# Patient Record
Sex: Female | Born: 1950 | Race: White | Hispanic: No | Marital: Married | Smoking: Former smoker
Health system: Southern US, Community
[De-identification: ages and names within clinical notes are randomized; demographics above are authoritative.]

## PROBLEM LIST (undated history)

## (undated) DIAGNOSIS — IMO0002 Reserved for concepts with insufficient information to code with codable children: Secondary | ICD-10-CM

## (undated) DIAGNOSIS — R112 Nausea with vomiting, unspecified: Secondary | ICD-10-CM

## (undated) DIAGNOSIS — N189 Chronic kidney disease, unspecified: Secondary | ICD-10-CM

## (undated) DIAGNOSIS — E039 Hypothyroidism, unspecified: Secondary | ICD-10-CM

## (undated) DIAGNOSIS — C801 Malignant (primary) neoplasm, unspecified: Secondary | ICD-10-CM

## (undated) DIAGNOSIS — N3010 Interstitial cystitis (chronic) without hematuria: Secondary | ICD-10-CM

## (undated) DIAGNOSIS — Z9889 Other specified postprocedural states: Secondary | ICD-10-CM

## (undated) DIAGNOSIS — Q2112 Patent foramen ovale: Secondary | ICD-10-CM

## (undated) DIAGNOSIS — F329 Major depressive disorder, single episode, unspecified: Secondary | ICD-10-CM

## (undated) DIAGNOSIS — G4700 Insomnia, unspecified: Secondary | ICD-10-CM

## (undated) DIAGNOSIS — K589 Irritable bowel syndrome without diarrhea: Secondary | ICD-10-CM

## (undated) DIAGNOSIS — E0590 Thyrotoxicosis, unspecified without thyrotoxic crisis or storm: Secondary | ICD-10-CM

## (undated) DIAGNOSIS — I639 Cerebral infarction, unspecified: Secondary | ICD-10-CM

## (undated) DIAGNOSIS — E785 Hyperlipidemia, unspecified: Secondary | ICD-10-CM

## (undated) DIAGNOSIS — F32A Depression, unspecified: Secondary | ICD-10-CM

## (undated) DIAGNOSIS — B977 Papillomavirus as the cause of diseases classified elsewhere: Secondary | ICD-10-CM

## (undated) DIAGNOSIS — M8580 Other specified disorders of bone density and structure, unspecified site: Secondary | ICD-10-CM

## (undated) DIAGNOSIS — A6000 Herpesviral infection of urogenital system, unspecified: Secondary | ICD-10-CM

## (undated) DIAGNOSIS — K219 Gastro-esophageal reflux disease without esophagitis: Secondary | ICD-10-CM

## (undated) DIAGNOSIS — E079 Disorder of thyroid, unspecified: Secondary | ICD-10-CM

## (undated) HISTORY — DX: Gastro-esophageal reflux disease without esophagitis: K21.9

## (undated) HISTORY — DX: Thyrotoxicosis, unspecified without thyrotoxic crisis or storm: E05.90

## (undated) HISTORY — PX: MASTECTOMY, PARTIAL: SHX709

## (undated) HISTORY — DX: Hyperlipidemia, unspecified: E78.5

## (undated) HISTORY — PX: BUNIONECTOMY: SHX129

## (undated) HISTORY — DX: Other specified disorders of bone density and structure, unspecified site: M85.80

## (undated) HISTORY — DX: Interstitial cystitis (chronic) without hematuria: N30.10

## (undated) HISTORY — DX: Reserved for concepts with insufficient information to code with codable children: IMO0002

## (undated) HISTORY — DX: Cerebral infarction, unspecified: I63.9

## (undated) HISTORY — DX: Chronic kidney disease, unspecified: N18.9

## (undated) HISTORY — DX: Papillomavirus as the cause of diseases classified elsewhere: B97.7

## (undated) HISTORY — PX: ENDOMETRIAL ABLATION: SHX621

## (undated) HISTORY — DX: Disorder of thyroid, unspecified: E07.9

## (undated) HISTORY — DX: Irritable bowel syndrome, unspecified: K58.9

## (undated) HISTORY — DX: Herpesviral infection of urogenital system, unspecified: A60.00

## (undated) HISTORY — DX: Major depressive disorder, single episode, unspecified: F32.9

## (undated) HISTORY — DX: Insomnia, unspecified: G47.00

## (undated) HISTORY — DX: Depression, unspecified: F32.A

## (undated) HISTORY — DX: Hypothyroidism, unspecified: E03.9

## (undated) HISTORY — PX: BREAST SURGERY: SHX581

## (undated) HISTORY — DX: Malignant (primary) neoplasm, unspecified: C80.1

---

## 1982-05-17 HISTORY — PX: TUBAL LIGATION: SHX77

## 1983-05-18 HISTORY — PX: GANGLION CYST EXCISION: SHX1691

## 1988-05-17 HISTORY — PX: OTHER SURGICAL HISTORY: SHX169

## 1998-06-17 ENCOUNTER — Other Ambulatory Visit: Admission: RE | Admit: 1998-06-17 | Discharge: 1998-06-17 | Payer: Self-pay | Admitting: Obstetrics and Gynecology

## 2000-06-13 ENCOUNTER — Other Ambulatory Visit: Admission: RE | Admit: 2000-06-13 | Discharge: 2000-06-13 | Payer: Self-pay | Admitting: Obstetrics and Gynecology

## 2001-05-17 DIAGNOSIS — R87619 Unspecified abnormal cytological findings in specimens from cervix uteri: Secondary | ICD-10-CM

## 2001-05-17 DIAGNOSIS — IMO0002 Reserved for concepts with insufficient information to code with codable children: Secondary | ICD-10-CM

## 2001-05-17 HISTORY — DX: Reserved for concepts with insufficient information to code with codable children: IMO0002

## 2001-05-17 HISTORY — DX: Unspecified abnormal cytological findings in specimens from cervix uteri: R87.619

## 2001-05-17 HISTORY — PX: CERVICAL CONE BIOPSY: SUR198

## 2001-07-05 ENCOUNTER — Other Ambulatory Visit: Admission: RE | Admit: 2001-07-05 | Discharge: 2001-07-05 | Payer: Self-pay | Admitting: Obstetrics and Gynecology

## 2001-08-21 ENCOUNTER — Other Ambulatory Visit: Admission: RE | Admit: 2001-08-21 | Discharge: 2001-08-21 | Payer: Self-pay | Admitting: Obstetrics and Gynecology

## 2001-12-25 ENCOUNTER — Other Ambulatory Visit: Admission: RE | Admit: 2001-12-25 | Discharge: 2001-12-25 | Payer: Self-pay | Admitting: Obstetrics and Gynecology

## 2002-01-19 ENCOUNTER — Encounter (INDEPENDENT_AMBULATORY_CARE_PROVIDER_SITE_OTHER): Payer: Self-pay

## 2002-01-19 ENCOUNTER — Ambulatory Visit (HOSPITAL_COMMUNITY): Admission: RE | Admit: 2002-01-19 | Discharge: 2002-01-19 | Payer: Self-pay | Admitting: Obstetrics and Gynecology

## 2002-07-09 ENCOUNTER — Other Ambulatory Visit: Admission: RE | Admit: 2002-07-09 | Discharge: 2002-07-09 | Payer: Self-pay | Admitting: Obstetrics and Gynecology

## 2002-12-28 ENCOUNTER — Other Ambulatory Visit: Admission: RE | Admit: 2002-12-28 | Discharge: 2002-12-28 | Payer: Self-pay | Admitting: Obstetrics and Gynecology

## 2003-05-18 DIAGNOSIS — C801 Malignant (primary) neoplasm, unspecified: Secondary | ICD-10-CM

## 2003-05-18 HISTORY — PX: BREAST LUMPECTOMY: SHX2

## 2003-05-18 HISTORY — DX: Malignant (primary) neoplasm, unspecified: C80.1

## 2003-10-29 ENCOUNTER — Other Ambulatory Visit: Admission: RE | Admit: 2003-10-29 | Discharge: 2003-10-29 | Payer: Self-pay | Admitting: Obstetrics and Gynecology

## 2003-11-26 ENCOUNTER — Encounter: Payer: Self-pay | Admitting: Internal Medicine

## 2004-04-15 ENCOUNTER — Encounter (INDEPENDENT_AMBULATORY_CARE_PROVIDER_SITE_OTHER): Payer: Self-pay | Admitting: *Deleted

## 2004-04-15 ENCOUNTER — Ambulatory Visit (HOSPITAL_COMMUNITY): Admission: RE | Admit: 2004-04-15 | Discharge: 2004-04-16 | Payer: Self-pay | Admitting: General Surgery

## 2004-04-15 ENCOUNTER — Encounter (INDEPENDENT_AMBULATORY_CARE_PROVIDER_SITE_OTHER): Payer: Self-pay | Admitting: General Surgery

## 2004-04-29 ENCOUNTER — Ambulatory Visit (HOSPITAL_BASED_OUTPATIENT_CLINIC_OR_DEPARTMENT_OTHER): Admission: RE | Admit: 2004-04-29 | Discharge: 2004-04-29 | Payer: Self-pay | Admitting: General Surgery

## 2004-04-29 ENCOUNTER — Ambulatory Visit (HOSPITAL_COMMUNITY): Admission: RE | Admit: 2004-04-29 | Discharge: 2004-04-29 | Payer: Self-pay | Admitting: General Surgery

## 2004-04-29 ENCOUNTER — Encounter (INDEPENDENT_AMBULATORY_CARE_PROVIDER_SITE_OTHER): Payer: Self-pay | Admitting: Specialist

## 2004-05-06 ENCOUNTER — Ambulatory Visit: Payer: Self-pay | Admitting: Oncology

## 2004-05-17 HISTORY — PX: BREAST LUMPECTOMY: SHX2

## 2004-05-25 ENCOUNTER — Ambulatory Visit: Admission: RE | Admit: 2004-05-25 | Discharge: 2004-08-12 | Payer: Self-pay | Admitting: Radiation Oncology

## 2004-07-29 ENCOUNTER — Ambulatory Visit: Payer: Self-pay | Admitting: Oncology

## 2004-09-10 ENCOUNTER — Ambulatory Visit: Admission: RE | Admit: 2004-09-10 | Discharge: 2004-09-10 | Payer: Self-pay | Admitting: Radiation Oncology

## 2004-10-21 ENCOUNTER — Ambulatory Visit: Payer: Self-pay | Admitting: Oncology

## 2004-12-09 ENCOUNTER — Other Ambulatory Visit: Admission: RE | Admit: 2004-12-09 | Discharge: 2004-12-09 | Payer: Self-pay | Admitting: Obstetrics and Gynecology

## 2005-01-04 ENCOUNTER — Ambulatory Visit: Payer: Self-pay | Admitting: Oncology

## 2005-04-14 ENCOUNTER — Ambulatory Visit: Payer: Self-pay | Admitting: Oncology

## 2005-07-07 ENCOUNTER — Ambulatory Visit: Payer: Self-pay | Admitting: Oncology

## 2005-10-06 ENCOUNTER — Ambulatory Visit: Payer: Self-pay | Admitting: Oncology

## 2005-11-24 ENCOUNTER — Encounter: Admission: RE | Admit: 2005-11-24 | Discharge: 2005-11-24 | Payer: Self-pay | Admitting: Oncology

## 2005-12-13 ENCOUNTER — Other Ambulatory Visit: Admission: RE | Admit: 2005-12-13 | Discharge: 2005-12-13 | Payer: Self-pay | Admitting: Obstetrics and Gynecology

## 2006-02-08 ENCOUNTER — Encounter (INDEPENDENT_AMBULATORY_CARE_PROVIDER_SITE_OTHER): Payer: Self-pay | Admitting: Specialist

## 2006-02-08 ENCOUNTER — Ambulatory Visit (HOSPITAL_BASED_OUTPATIENT_CLINIC_OR_DEPARTMENT_OTHER): Admission: RE | Admit: 2006-02-08 | Discharge: 2006-02-08 | Payer: Self-pay | Admitting: General Surgery

## 2006-03-30 ENCOUNTER — Ambulatory Visit: Payer: Self-pay | Admitting: Oncology

## 2006-09-14 ENCOUNTER — Ambulatory Visit: Payer: Self-pay | Admitting: Oncology

## 2006-10-31 ENCOUNTER — Encounter: Admission: RE | Admit: 2006-10-31 | Discharge: 2006-10-31 | Payer: Self-pay | Admitting: Oncology

## 2006-12-27 ENCOUNTER — Other Ambulatory Visit: Admission: RE | Admit: 2006-12-27 | Discharge: 2006-12-27 | Payer: Self-pay | Admitting: Addiction Medicine

## 2007-05-18 HISTORY — PX: HYSTEROSCOPY: SHX211

## 2007-05-18 HISTORY — PX: DILATION AND CURETTAGE OF UTERUS: SHX78

## 2007-08-23 ENCOUNTER — Encounter: Admission: RE | Admit: 2007-08-23 | Discharge: 2007-08-23 | Payer: Self-pay | Admitting: Internal Medicine

## 2007-09-07 ENCOUNTER — Ambulatory Visit (HOSPITAL_BASED_OUTPATIENT_CLINIC_OR_DEPARTMENT_OTHER): Admission: RE | Admit: 2007-09-07 | Discharge: 2007-09-07 | Payer: Self-pay | Admitting: Obstetrics and Gynecology

## 2007-09-07 ENCOUNTER — Encounter: Payer: Self-pay | Admitting: Obstetrics and Gynecology

## 2007-09-26 ENCOUNTER — Encounter: Admission: RE | Admit: 2007-09-26 | Discharge: 2007-09-26 | Payer: Self-pay | Admitting: Internal Medicine

## 2007-10-12 ENCOUNTER — Encounter (INDEPENDENT_AMBULATORY_CARE_PROVIDER_SITE_OTHER): Payer: Self-pay | Admitting: Interventional Radiology

## 2007-10-12 ENCOUNTER — Other Ambulatory Visit: Admission: RE | Admit: 2007-10-12 | Discharge: 2007-10-12 | Payer: Self-pay | Admitting: Interventional Radiology

## 2007-10-12 ENCOUNTER — Encounter: Admission: RE | Admit: 2007-10-12 | Discharge: 2007-10-12 | Payer: Self-pay | Admitting: Internal Medicine

## 2007-10-13 ENCOUNTER — Ambulatory Visit (HOSPITAL_BASED_OUTPATIENT_CLINIC_OR_DEPARTMENT_OTHER): Admission: RE | Admit: 2007-10-13 | Discharge: 2007-10-13 | Payer: Self-pay | Admitting: Obstetrics and Gynecology

## 2007-11-06 ENCOUNTER — Encounter: Admission: RE | Admit: 2007-11-06 | Discharge: 2007-11-06 | Payer: Self-pay | Admitting: Endocrinology

## 2007-12-06 ENCOUNTER — Other Ambulatory Visit: Admission: RE | Admit: 2007-12-06 | Discharge: 2007-12-06 | Payer: Self-pay | Admitting: General Surgery

## 2008-01-08 ENCOUNTER — Other Ambulatory Visit: Admission: RE | Admit: 2008-01-08 | Discharge: 2008-01-08 | Payer: Self-pay | Admitting: Obstetrics and Gynecology

## 2008-02-05 ENCOUNTER — Telehealth: Payer: Self-pay | Admitting: Internal Medicine

## 2008-03-12 DIAGNOSIS — K5730 Diverticulosis of large intestine without perforation or abscess without bleeding: Secondary | ICD-10-CM | POA: Insufficient documentation

## 2008-03-12 DIAGNOSIS — D0590 Unspecified type of carcinoma in situ of unspecified breast: Secondary | ICD-10-CM | POA: Insufficient documentation

## 2008-06-30 IMAGING — NM NM THYROID IMAGING W/ UPTAKE SINGLE (24 HR)
1 series · 1 of 1 positions shown · non-contrast
Comparison: None

CLINICAL DATA: Enlarged thyroid gland

NUCLEAR MEDICINE THYROID UPTAKE (24 HOUR) AND SCAN
TECHNIQUE: Following the per oral administration of Q-WOW Sodium
Iodide, the patient returned at 24 hours and uptake measurements
were acquired with the uptake probe centered on the neck. Thyroid
imaging was performed following the intravenous administration of
the Qc-FFm pertechnetate.
Radiopharmaceutical: 16.5 microcurie I 131 sodium iodide for
radioactive iodine uptake calculation.  11.1 mCi technetium 99m
pertechnetate for thyroid scanning.

[st statics, dual detec · 1 of 1 slices shown]
[im 1/1]
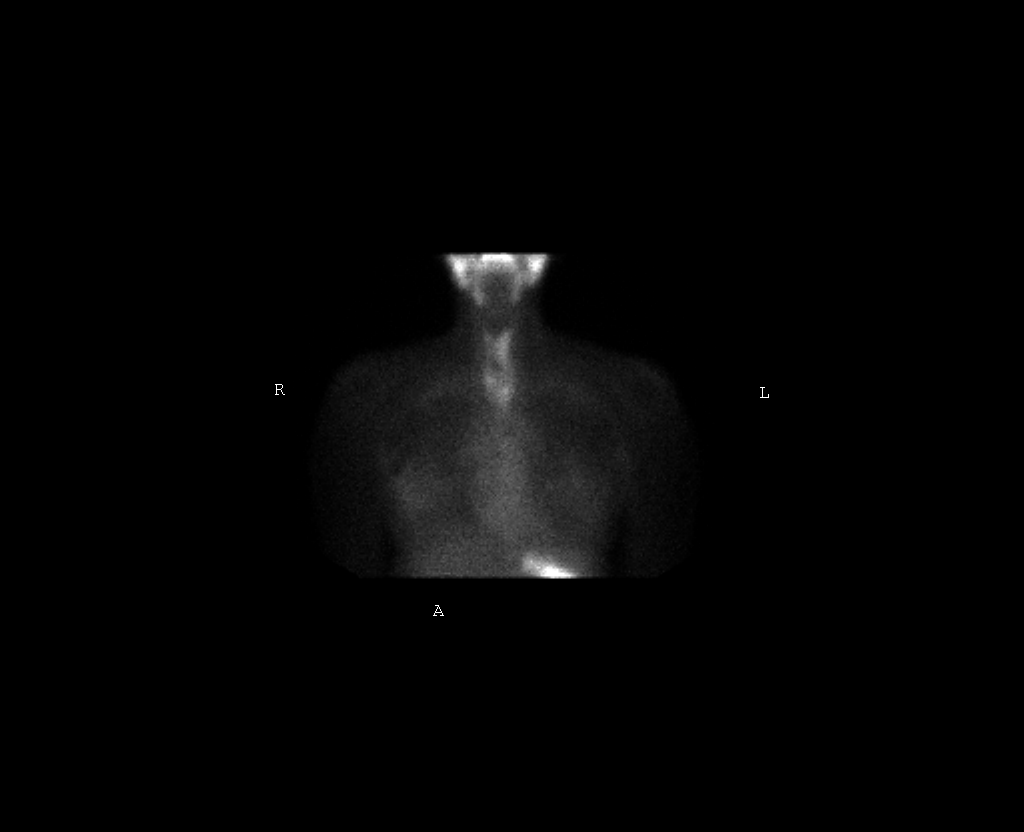

[1 of 1 positions shown; findings below may reference images not displayed]

FINDINGS: Thyroid scanning shows a markedly enlarged thyroid gland
with diffuse heterogeneous pertechnetate uptake.  The uptake
extends into the substernal region where they are is even more
heterogeneous uptake than in the native thyroid location.

Radioactive iodine uptake is calculated at 12%.
IMPRESSION: Markedly enlarged thyroid gland with diffuse heterogeneous
pertechnetate uptake throughout.  The substernal component has a
more heterogeneous uptake than the tissue in the native thyroid
location..

Low normal radioactive iodine uptake.

## 2009-01-14 ENCOUNTER — Encounter: Payer: Self-pay | Admitting: Obstetrics and Gynecology

## 2009-01-14 ENCOUNTER — Other Ambulatory Visit: Admission: RE | Admit: 2009-01-14 | Discharge: 2009-01-14 | Payer: Self-pay | Admitting: Obstetrics and Gynecology

## 2009-01-14 ENCOUNTER — Ambulatory Visit: Payer: Self-pay | Admitting: Obstetrics and Gynecology

## 2009-07-29 ENCOUNTER — Ambulatory Visit: Payer: Self-pay | Admitting: Obstetrics and Gynecology

## 2009-11-06 ENCOUNTER — Telehealth: Payer: Self-pay | Admitting: Internal Medicine

## 2009-11-28 ENCOUNTER — Encounter: Admission: RE | Admit: 2009-11-28 | Discharge: 2009-11-28 | Payer: Self-pay | Admitting: Radiology

## 2010-01-15 ENCOUNTER — Other Ambulatory Visit: Admission: RE | Admit: 2010-01-15 | Discharge: 2010-01-15 | Payer: Self-pay | Admitting: Obstetrics and Gynecology

## 2010-01-15 ENCOUNTER — Ambulatory Visit: Payer: Self-pay | Admitting: Obstetrics and Gynecology

## 2010-03-25 ENCOUNTER — Ambulatory Visit: Payer: Self-pay | Admitting: Obstetrics and Gynecology

## 2010-05-17 HISTORY — PX: MOUTH SURGERY: SHX715

## 2010-06-07 ENCOUNTER — Encounter: Payer: Self-pay | Admitting: Radiology

## 2010-06-16 NOTE — Progress Notes (Signed)
Summary: TRIAGE  Phone Note Call from Patient   Caller: Patient Call For: GESSNER Reason for Call: Talk to Nurse Details for Reason: TRIAGE Summary of Call: BLOOD IN STOOL pt is NOT due for another colon until summer of 2015 Initial call taken by: Guadlupe Spanish [facility],  February 05, 2008 3:58 PM  Follow-up for Phone Call        Left message for patient to call back Darcey Nora RN  February 06, 2008 7:59 AM  left message for pt to call back and schedule office visit with Dr Elzie Rings Follow-up by: Darcey Nora RN,  February 06, 2008 1:02 PM

## 2010-06-16 NOTE — Procedures (Signed)
Summary: COLON   Colonoscopy  Procedure date:  11/26/2003  Findings:      Location:  [facility].    Procedures Next Due Date:    Colonoscopy: 11/2013   Patient Name: Jaclyn Lopez MRN:  Procedure Procedures: Colonoscopy CPT: 69629.  Personnel: Endoscopist: Iva Boop, MD, [facility].  Referred By: Rande Brunt. Eda Paschal, M.D.  Exam Location: Exam performed in Outpatient Clinic. Outpatient  Patient Consent: Procedure, Alternatives, Risks and Benefits discussed, consent obtained, from patient. Consent was obtained by the RN.  Indications  Average Risk Screening Routine.  History  Current Medications: Patient is not currently taking Coumadin.  Pre-Exam Physical: Performed Nov 26, 2003. Cardio-pulmonary exam, Rectal exam, HEENT exam , Abdominal exam, Mental status exam WNL.  Exam Exam: Extent of exam reached: Cecum, extent intended: Cecum.  The cecum was identified by appendiceal orifice and IC valve. Patient position: on left side. Colon retroflexion performed. Images taken. ASA Classification: I. Tolerance: good.  Monitoring: Pulse and BP monitoring, Oximetry used. Supplemental O2 given.  Colon Prep Used MiraLax for colon prep. Prep results: excellent.  Sedation Meds: Patient assessed and found to be appropriate for moderate (conscious) sedation. Fentanyl 100 mcg. given IV. Versed 10 mg. given IV.  Findings - NORMAL EXAM: Cecum to Descending Colon.  DIVERTICULOSIS: Sigmoid Colon. ICD9: Diverticulosis, Colon: 562.10. Comments: Mild.  NORMAL EXAM: Rectum.   Assessment Abnormal examination, see findings above.  Diagnoses: 562.10: Diverticulosis, Colon.   Comments: MILD SIGMOID DIVERTICULOSIS Events  Unplanned Interventions: No intervention was required.  Plans Patient Education: Patient given standard instructions for: Diverticulosis. Yearly hemoccult testing recommended. START HEMOCCULTS IN 5 YEARS.  Disposition: After procedure  patient sent to recovery. After recovery patient sent home.  Scheduling/Referral: Colonoscopy, to Iva Boop, MD, [facility], 10 YEARS,    This report was created from the original endoscopy report, which was reviewed and signed by the above listed endoscopist.

## 2010-06-16 NOTE — Progress Notes (Signed)
Summary: Recall colon  Phone Note Call from Patient Call back at Home Phone [phone]   Call For: DR [facility] Summary of Call: Wonders if her REcall Colon should be sooner due to having had breast cancer in the past.  Aware Dr is off rest of the week- ok to call back after her chart is reviewed sometime next week. Initial call taken by: Leanor Kail [facility],  November 06, 2009 2:32 PM  Follow-up for Phone Call        Paper chart ordered.     Follow-up by: Darcey Nora RN, CGRN,  November 06, 2009 3:22 PM  Additional Follow-up for Phone Call Additional follow up Details #1::        Paper chart is in your office please advise Additional Follow-up by: Darcey Nora RN, CGRN,  November 07, 2009 1:48 PM    Additional Follow-up for Phone Call Additional follow up Details #2::    If she has BRCA1 genetic abnormality, then yes. Otherwise it is unclear and not typical to do at 5 years, usually do as we do for routine risk.  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  She has no symptoms.  Recall is in for 2015 Additional Follow-up by: Darcey Nora RN, CGRN,  November 13, 2009 3:39 PM

## 2010-09-29 NOTE — Op Note (Signed)
[name], Jaclyn Lopez            ACCOUNT NO.:  [id number]   MEDICAL RECORD NO.:  [id number]          PATIENT TYPE:  AMB   LOCATION:  [facility]                         FACILITY:  [facility]   PHYSICIAN:  Daniel L. Gottsegen, M.D.DATE OF BIRTH:  09-18-1950   DATE OF PROCEDURE:  10/13/2007  DATE OF DISCHARGE:                               OPERATIVE REPORT   PREOPERATIVE DIAGNOSIS:  Menometrorrhagia.   POSTOPERATIVE DIAGNOSIS:  Menometrorrhagia.   NAME OF OPERATION:  NovaSure endometrial ablation.   INDICATIONS:  The patient is a 60 year old female who has persisted in  having menometrorrhagia.  She has had a previous hysteroscopy this year,  with excision of an endometrial polyp, but has continued to have the  above pattern.  She is a breast cancer survivor and cannot be cycled  with oral progestins because of the above.  Because of this persistence,  she enters the hospital now for endometrial ablation by NovaSure  technique.   FINDINGS:  External is normal.  BUS is normal.  Vaginal is normal.  Cervix is clean.  Uterus is normal size and shape.  Adnexa failed to  reveal masses.  At the time of hysteroscopy, the patient had a  completely normal endometrial cavity, with evidence of some endometrial  buildup.  Tubal ostia, top of the fundus, anterior and posterior walls  of the fundus, lower uterine segment, and endocervical canal were all  free of disease.   PROCEDURE:  After adequate general anesthesia, the patient was placed in  the dorsal lithotomy position, prepped and draped in the usual sterile  manner.  A diagnostic hysteroscopy was done with Ringer's lactate and  failed to reveal any intrauterine defects.  The uterus sounded to 7 cm.  Her endocervix had a depth of approximately 3.5 cm, leaving a cavity  length of about 3.5 cm.  As a result of this, we used the 4 cm cavity  length on the NovaSure, which is the lowest setting.  The NovaSure was  then introduced after the measurements  had been taken.  It was opened  inside the endometrial cavity without difficulty.  The patient had been  dilated to a #8 Hagar dilator to do so.  We three different times did  the anterior, posterior, side-to-side maneuvers to try to open the  device as far as possible so that we could get good contact with all  parts of the endometrial cavity.  The final cavity width was 2.9 cm.  A  cavity assessment test was then done, which the patient passed, without  any evidence of leakage, and endometrial ablation was then done with the  NovaSure.  It took 105 seconds to completely ablate the cavity.  Power  was 64.  At the termination of the procedure, the NovaSure was removed.  She was re-hysteroscoped.  An excellent endometrial ablation had been  obtained.  It was noted that the endocervical canal had not been  ablated, so we felt that the risk of hematometra would be very low, as  the ablation stopped right at the lower uterine segment without entering  the endocervical canal.  Blood loss was  minimal.  The patient tolerated  the procedure well and left the operating room in satisfactory  condition.      Daniel L. Eda Paschal, M.D.  Electronically Signed    DLG/MEDQ  D:  10/13/2007  T:  10/13/2007  Job:  [id number]

## 2010-09-29 NOTE — Op Note (Signed)
Jaclyn Lopez, Jaclyn Lopez            ACCOUNT NO.:  [id number]   MEDICAL RECORD NO.:  [id number]          PATIENT TYPE:  AMB   LOCATION:  [facility]                         FACILITY:  [facility]   PHYSICIAN:  Daniel L. Gottsegen, M.D.DATE OF BIRTH:  09/11/1950   DATE OF PROCEDURE:  09/07/2007  DATE OF DISCHARGE:                               OPERATIVE REPORT   PREOPERATIVE DIAGNOSIS:  Postmenopausal bleeding.   POSTOPERATIVE DIAGNOSIS:  1. Postmenopausal bleeding.  2. Endometrial polyp.   OPERATIONS:  Hysteroscopy D&C and excision of endometrial polyp.   SURGEON:  Dr. Eda Paschal.   ANESTHESIA:  General.   INDICATIONS:  The patient is a 60 year old, gravida 2, para 2 who had  presented to the office with what was initially thought to be  postmenopausal bleeding. Ultrasound did not reveal a source of the  bleeding.  She had had a previously elevated FSH and FSH was rechecked  in the office and actually was back in the premenopausal range however,  she continued to bleed nonstop for a solid month.  She is status post  treatment for breast cancer and it was not felt that hormonal therapy  was appropriate.  She therefore enters the hospital for a hysteroscopy  D&C for the above.   FINDINGS:  External is normal, BUS is normal. Vaginal is normal.  Cervix  is clean.  Uterus is normal size and shape.  Adnexa failed to reveal  masses. At the time of hysteroscopy, the patient did have an endometrial  polyp coming off the top of the fundus. It was less than a centimeter.  Endometrial curettings were scant to moderate, tubal ostia, top of the  fundus, anterior and posterior walls of the fundus, lower uterine  segment were all free of disease as was the cervical canal.   PROCEDURE:  After adequate general endotracheal anesthesia, the patient  was placed in the dorsal lithotomy supine position, prepped and draped  in the usual sterile manner.  A single-tooth tenaculum was placed in the  anterior lip of  the cervix, the cervix was dilated. Once we got to a 29  dilator, it was very difficult to dilate her. She was postmenopausal by  history as well as having had one C-section. It was felt at this point  that it might be dangerous to continue to push hard enough to dilate  her, so instead a diagnostic hysteroscope was introduced, 3% sorbitol  was used to expand the intrauterine cavity. A  camera was used for  magnification.  The above findings were noted.  Using a curette and  Randall polyp forceps, tissue was removed as well as the small polyp.  She was rehysteroscoped and although nothing obvious was seen the  surgeon was still concerned that he may not have had as good a view was  he would like. At this point, it was felt that maybe she could be  dilated further without any trouble. It did require a fair bit of force  but she was dilated further. The resectoscope then could be introduced.  A much better view  was obtained and there was no pathology left. The  procedure was  terminated.  Blood loss was minimal, fluid deficit was 30 mL. The tissue  was sent to pathology for tissue diagnosis.  The patient left the  operating room in satisfactory condition.      Daniel L. Eda Paschal, M.D.  Electronically Signed     DLG/MEDQ  D:  09/07/2007  T:  09/07/2007  Job:  [id number]

## 2010-10-02 NOTE — Op Note (Signed)
Lopez, Jaclyn            ACCOUNT NO.:  [id number]   MEDICAL RECORD NO.:  [id number]          PATIENT TYPE:  AMB   LOCATION:  [facility]                          FACILITY:  [facility]   PHYSICIAN:  Adolph Pollack, M.D.DATE OF BIRTH:  1951/03/29   DATE OF PROCEDURE:  04/29/2004  DATE OF DISCHARGE:                                 OPERATIVE REPORT   PREOPERATIVE DIAGNOSIS:  High grade ductal carcinoma in situ, right breast,  status post right breast lumpectomy.   POSTOPERATIVE DIAGNOSIS:  High grade ductal carcinoma in situ, right breast,  status post right breast lumpectomy.   OPERATION PERFORMED:  Re-excision of right breast lumpectomy site.   SURGEON:  Adolph Pollack, M.D.   ANESTHESIA:  General.   INDICATIONS FOR PROCEDURE:  Ms. [name] is a 60 year old female status post  right partial mastectomy, April 15, 2004.  She had this for an abnormal  mammogram and it was done after needle localization.  Pathology came back  with high grade DCIS with a 0.1 cm margin.  After presentation at the breast  cancer conference, it was felt that a wider margin would be beneficial to  her and now she presents for that.   DESCRIPTION OF PROCEDURE:  She was seen in the holding area and brought to  the operating room and placed supine on the operating table and a general  anesthetic was administered.  The Steri-Strips on the right upper outer  quadrant were removed and the right breast was sterilely prepped and draped.  I incised the incision using elliptical type incision and tried to stay  superficial; however, quickly entered the previous cavity and drained a  seroma.  I subsequently just removed the skin and called this an anterior  margin.  I did not think I could do an excision because some of the skin  flaps were so thin and so I just went from site to site.  I began with the  medial margin and excised that sharply and labeled it separately.  I did the  same with the superior,  lateral, inferior and posterior margins.  The  posterior margin was on the chest wall.  This was multiple specimens, all of  which were labeled appropriately and sent to pathology.   I then controlled bleeding with electrocautery and infiltrated some local  anesthetic (plain Marcaine into the superficial and deep tissues of the  wound.  Once hemostasis was adequate, I irrigated the wound.  I then closed  the skin with a running 4-0 Monocryl subcuticular stitch followed by  Dermabond.  Steri-Strips and a sterile dressing were applied.  The patient  tolerated the procedure well without any apparent complications and was  taken to the recovery room in satisfactory condition.  Because she had such  a severe reaction of nausea and vomiting last time, I  decided to go ahead and admit her for an overnight stay.  If later this  evening, she does not have such a significant problem with nausea and  vomiting, she can potentially go home.  Plan will be to follow up with her  some time next  Tuesday in the late afternoon.      Tish Men  D:  04/29/2004  T:  04/29/2004  Job:  [id number]   cc:   Reuel Boom L. Eda Paschal, M.D.  [address]  [place]  [place] [address]  Fax: [phone]   Dr. Jeralyn Ruths   Dr. Fidela Salisbury

## 2010-10-02 NOTE — Op Note (Signed)
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   PROCEDURE:  Left breast biopsy after needle localization.   SURGEON:  Adolph Pollack, M.D.   ANESTHESIA:  General plus 0.5% plain Marcaine local.   INDICATIONS:  Ms. [name] is a 60 year old female with a history of DCIS of  the right breast.  She had an MRI done of the breast which demonstrated  abnormal area in left breast at 9 o'clock position and ultrasound guided  biopsy demonstrated some extensive atypical ductal hyperplasia.  It was felt  that it would be best to remove this area.  A successful wire localization  was done by Dr. Jeralyn Ruths and now she presents for the above  procedure.   TECHNIQUE:  She is seen the holding area and left breast marked with my  initials.  She was then brought to the operating room, placed supine on the  operating table and general anesthetic was administered.  The bandage was  removed.  The wire was cut.  The left breast and wire  were sterilely  prepped and draped.  There is an X made by Dr. Yolanda Bonine on the skin over the  area of concern and a curvilinear incision was made in the medial aspect of  the breast through skin and subcutaneous tissue.  I then placed the wire  into the wound.  I then excised the core of tissue around the midportion of  the wire where the area of concern was and sent this for specimen mammogram.  The specimen mammogram confirmed that the area of concern was in the   specimen.   Following this I injected the wound with 0.5% plain Marcaine.  Hemostasis  was controlled with electrocautery.  Once hemostasis was adequate.  The  wound was closed in two layers.  The subcutaneous tissue was approximated  with interrupted 3-0 Vicryl sutures and skin was closed with a running 4-0  Monocryl subcuticular stitch.  Steri-Strips and sterile dressing were  applied.   She tolerated the procedure without apparent complications and was taken to  recovery in satisfactory condition.  She will be given postop instructions  and medication for nausea and pain and follow-up in the office in about 1-2  weeks.      Adolph Pollack, M.D.  Electronically Signed     TJR/MEDQ  D:  02/08/2006  T:  02/10/2006  Job:  [id number]   cc:   Dellia Beckwith, M.D.  Daniel L. Eda Paschal, M.D.  Maryln Gottron, M.D.  Etter Sjogren, M.D.

## 2010-10-02 NOTE — Op Note (Signed)
NAME:  Jaclyn Lopez, Jaclyn Lopez            ACCOUNT NO.:  [id number]   MEDICAL RECORD NO.:  [id number]          PATIENT TYPE:  OIB   LOCATION:  NA                           FACILITY:  [facility]   PHYSICIAN:  Adolph Pollack, M.D.DATE OF BIRTH:  03-28-1951   DATE OF PROCEDURE:  04/15/2004  DATE OF DISCHARGE:                                 OPERATIVE REPORT   PREOPERATIVE DIAGNOSIS:  Abnormal microcalcifications, right breast.   POSTOPERATIVE DIAGNOSIS:  Abnormal microcalcifications, right breast.   OPERATION PERFORMED:  Right partial mastectomy after needle localization.   SURGEON:  Adolph Pollack, M.D.   ANESTHESIA:  General.   INDICATIONS FOR PROCEDURE:  Ms.  [name] is a 60 year old female who has been  having some interval mammograms and suspicious microcalcifications of the  upper outer quadrant of the right breast were noted.  A stereotactic biopsy  was not able to be done.  She now presents for right partial mastectomy  after bracketed needle localization.   DESCRIPTION OF PROCEDURE:  After having successful bracketed needle  localization by Dr. Yolanda Bonine, she was seen in the holding area, brought to  the operating room, placed supine on the operating table and a general  anesthetic was administered.  The wires were exposed in the upper outer  quadrant of the right breast and part of them were cut.  The area was then  sterilely prepped and draped.  A curvilinear incision was made in the upper  outer quadrant of the right breast.  Subcutaneous tissue was dissected  sharply and the wires were both delivered into the wound.  Next, I performed  a partial mastectomy at base of the upper outer quadrant of the right breast  sharply and with electrocautery.  I recovered the wires.  I sent it for  specimen mammography and the area of concern was contained in the specimen.   The wound bed was evaluated and bleeding was controlled with interrupted  Vicryl sutures and cautery.  The wound was  irrigated.  The dissection went  all the way down to her chest wall.  Once the hemostasis was adequate I then  closed the wound in two layers closing the subcutaneous tissue loosely with  3-0 Vicryl sutures and closing the skin  with 4-0 Monocryl subcuticular stitch.  Steri-Strips and sterile dressing  were applied.  The patient tolerated the procedure well without any apparent  complications.  She subsequently was taken to recovery room in satisfactory  condition.      Tish Men  D:  04/15/2004  T:  04/15/2004  Job:  [id number]   cc:   Dr. Jeralyn Ruths   Dr. Roney Marion

## 2010-10-02 NOTE — Op Note (Signed)
NAME:  Jaclyn Lopez                          ACCOUNT NO.:  [id number]   MEDICAL RECORD NO.:  [id number]                   PATIENT TYPE:  AMB   LOCATION:  [facility]                                  FACILITY:  [facility]   PHYSICIAN:  Daniel L. Eda Paschal, M.D.           DATE OF BIRTH:  [birth year]   DATE OF PROCEDURE:  01/19/2002  DATE OF DISCHARGE:                                 OPERATIVE REPORT   PREOPERATIVE DIAGNOSES:  Abnormal Pap smear with concern about significant  endocervical atypia.   POSTOPERATIVE DIAGNOSES:  Abnormal Pap smear with concern about significant  endocervical atypia.   NAME OF OPERATION:  Sharp knife conization.   SURGEON:  Daniel L. Eda Paschal, M.D.   ANESTHESIA:  General.   INDICATIONS:  The patient is a 61 year old gravida 2, para 2, AB0 who had  been seeing me for annual examinations on a long-term basis and always had  had normal Pap smears.  Starting this year, however, she had two Pap smears  that showed ASCUS.  As a result of this she underwent colposcopy.  Ectocervix appeared normal but random biopsies were obtained.  ECC was also  obtained and both of the biopsy areas were normal.  However, her next Pap  smear continued to show atypia.  It was reviewed by Dr. Tamala Fothergill who had  grave concern that she might have a significant endocervical atypia in spite  of the above.  He did not actually see it but he felt that there was just  enough suspicion in the endocervical cells that he felt conization should be  done so that we would get as deep as possible to rule out any endocervical  adenocarcinoma.  She now enters the hospital for the above.   FINDINGS:  External and vaginal is within normal limits.  Cervix is  completely clean.  Uterus is mid position, normal size and shape.  Ovaries  are palpably normal.   PROCEDURE:  The patient was taken to the operating room, given general  endotracheal anesthesia and placed in the dorsal lithotomy position.  She  was prepped and draped in the usual sterile manner.  Vicryl 0 lateral  sutures were placed at 3 and 9 o'clock.  A solution of 0.5% Xylocaine with  1:200,000 epinephrine was injected around the cervix and then a sharp knife  conization was done to as deep as the knife could penetrate.  The specimen  was removed.  It was labeled and tagged for orientation and then an attempt  was made to go even deeper.  An Lavena Bullion was placed in the endocervix and some  additional higher endocervical tissue was also sharply cut free.  The areas  that were oozing a little bit were bovied.  Surgicel was placed in the canal  and then a 360 degree McDonald's type suture was placed with 0 Vicryl to  eliminate any other bleeding.  It was tied in  place and at this point the  patient was doing no bleeding.  Estimated blood loss for the entire  procedure was less than 10 cc with none replaced.  The patient tolerated  procedure well and left the operating room in satisfactory condition.                                               Daniel L. Eda Paschal, M.D.    Tonette Bihari  D:  01/19/2002  T:  01/19/2002  Job:  [id number]

## 2010-10-13 ENCOUNTER — Encounter (INDEPENDENT_AMBULATORY_CARE_PROVIDER_SITE_OTHER): Payer: Self-pay | Admitting: General Surgery

## 2010-12-24 ENCOUNTER — Encounter: Payer: Self-pay | Admitting: General Surgery

## 2011-02-24 ENCOUNTER — Encounter: Payer: Self-pay | Admitting: Gynecology

## 2011-02-24 DIAGNOSIS — N840 Polyp of corpus uteri: Secondary | ICD-10-CM | POA: Insufficient documentation

## 2011-02-24 DIAGNOSIS — M8580 Other specified disorders of bone density and structure, unspecified site: Secondary | ICD-10-CM | POA: Insufficient documentation

## 2011-02-24 DIAGNOSIS — N3010 Interstitial cystitis (chronic) without hematuria: Secondary | ICD-10-CM | POA: Insufficient documentation

## 2011-02-24 DIAGNOSIS — R87619 Unspecified abnormal cytological findings in specimens from cervix uteri: Secondary | ICD-10-CM | POA: Insufficient documentation

## 2011-02-24 DIAGNOSIS — E039 Hypothyroidism, unspecified: Secondary | ICD-10-CM | POA: Insufficient documentation

## 2011-02-24 DIAGNOSIS — IMO0002 Reserved for concepts with insufficient information to code with codable children: Secondary | ICD-10-CM | POA: Insufficient documentation

## 2011-02-24 DIAGNOSIS — E079 Disorder of thyroid, unspecified: Secondary | ICD-10-CM | POA: Insufficient documentation

## 2011-02-24 DIAGNOSIS — A6000 Herpesviral infection of urogenital system, unspecified: Secondary | ICD-10-CM | POA: Insufficient documentation

## 2011-03-02 ENCOUNTER — Encounter: Payer: Self-pay | Admitting: Obstetrics and Gynecology

## 2011-03-02 ENCOUNTER — Other Ambulatory Visit (HOSPITAL_COMMUNITY)
Admission: RE | Admit: 2011-03-02 | Discharge: 2011-03-02 | Disposition: A | Discharge status: Home / Self Care | Payer: BC Managed Care – PPO | Source: Ambulatory Visit | Attending: Obstetrics and Gynecology | Admitting: Obstetrics and Gynecology

## 2011-03-02 ENCOUNTER — Ambulatory Visit (INDEPENDENT_AMBULATORY_CARE_PROVIDER_SITE_OTHER): Payer: BC Managed Care – PPO | Admitting: Obstetrics and Gynecology

## 2011-03-02 VITALS — BP 110/60 | Ht 66.0 in | Wt 135.0 lb

## 2011-03-02 DIAGNOSIS — R635 Abnormal weight gain: Secondary | ICD-10-CM

## 2011-03-02 DIAGNOSIS — Z01419 Encounter for gynecological examination (general) (routine) without abnormal findings: Secondary | ICD-10-CM | POA: Insufficient documentation

## 2011-03-02 DIAGNOSIS — B009 Herpesviral infection, unspecified: Secondary | ICD-10-CM

## 2011-03-02 MED ORDER — ACYCLOVIR 200 MG PO CAPS: ORAL_CAPSULE | ORAL | Status: DC

## 2011-03-02 NOTE — Progress Notes (Signed)
Patient came to see me today for her annual GYN exam. From the standpoint of her breast cancer and gynecologically  she is stable without problems. She remains on Zovirax 3 times a week to prevent herpes recurrences. She's having no vaginal bleeding. She is having no pelvic pain. She is now on two any depressants with good results but is a lot of weight gain on them. She is up-to-date on mammograms and bone densities. She is osteopenia without an elevated FRAX risk. She takes calcium and vitamin D. She's had no fractures.  ROS: All systems reviewed. Positives include what is noted above as well as hypothyroidism, diverticulosis, and interstitial cystitis.  Physical examination: Kennon Portela present. HEENT within normal limits. Neck: Thyroid not large. No masses. Supraclavicular nodes: not enlarged. Breasts: Examined in both sitting midline position. No skin changes and no masses. Abdomen: Soft no guarding rebound or masses or hernia. Pelvic: External: Within normal limits. BUS: Within normal limits. Vaginal:within normal limits. Poor estrogen effect. No evidence of cystocele rectocele or enterocele. Cervix: clean. Uterus: Normal size and shape. Adnexa: No masses. Rectovaginal exam: Confirmatory and negative. Extremities: Within normal limits.  Assessment: #1. 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 Osteopenia #4. Weight gain  Plan: Continue Zovirax 3 times a week. Continue yearly mammograms. Bone density in November 2013. Discussed with the patient changing from current drugs to Pristiq to help her lose weight.

## 2011-03-05 ENCOUNTER — Encounter: Payer: Self-pay | Admitting: Obstetrics and Gynecology

## 2011-05-06 ENCOUNTER — Other Ambulatory Visit: Payer: Self-pay | Admitting: Internal Medicine

## 2011-05-06 DIAGNOSIS — E041 Nontoxic single thyroid nodule: Secondary | ICD-10-CM

## 2011-05-13 ENCOUNTER — Other Ambulatory Visit: Payer: BC Managed Care – PPO

## 2011-05-17 ENCOUNTER — Ambulatory Visit
Admission: RE | Admit: 2011-05-17 | Discharge: 2011-05-17 | Disposition: A | Discharge status: Home / Self Care | Payer: BC Managed Care – PPO | Source: Ambulatory Visit | Attending: Internal Medicine | Admitting: Internal Medicine

## 2011-05-17 DIAGNOSIS — E041 Nontoxic single thyroid nodule: Secondary | ICD-10-CM

## 2011-06-10 ENCOUNTER — Encounter (INDEPENDENT_AMBULATORY_CARE_PROVIDER_SITE_OTHER): Payer: Self-pay | Admitting: General Surgery

## 2011-11-19 ENCOUNTER — Other Ambulatory Visit: Payer: Self-pay | Admitting: *Deleted

## 2011-11-19 DIAGNOSIS — Z853 Personal history of malignant neoplasm of breast: Secondary | ICD-10-CM

## 2011-12-02 ENCOUNTER — Other Ambulatory Visit: Payer: Self-pay | Admitting: Obstetrics and Gynecology

## 2011-12-02 DIAGNOSIS — Z853 Personal history of malignant neoplasm of breast: Secondary | ICD-10-CM

## 2012-04-11 ENCOUNTER — Other Ambulatory Visit (HOSPITAL_COMMUNITY)
Admission: RE | Admit: 2012-04-11 | Discharge: 2012-04-11 | Disposition: A | Discharge status: Home / Self Care | Payer: BC Managed Care – PPO | Source: Ambulatory Visit | Attending: Obstetrics and Gynecology | Admitting: Obstetrics and Gynecology

## 2012-04-11 ENCOUNTER — Encounter: Payer: Self-pay | Admitting: Obstetrics and Gynecology

## 2012-04-11 ENCOUNTER — Ambulatory Visit (INDEPENDENT_AMBULATORY_CARE_PROVIDER_SITE_OTHER): Payer: BC Managed Care – PPO | Admitting: Obstetrics and Gynecology

## 2012-04-11 VITALS — BP 120/64 | Ht 66.0 in | Wt 140.0 lb

## 2012-04-11 DIAGNOSIS — R87810 Cervical high risk human papillomavirus (HPV) DNA test positive: Secondary | ICD-10-CM | POA: Insufficient documentation

## 2012-04-11 DIAGNOSIS — Z01419 Encounter for gynecological examination (general) (routine) without abnormal findings: Secondary | ICD-10-CM

## 2012-04-11 DIAGNOSIS — B009 Herpesviral infection, unspecified: Secondary | ICD-10-CM

## 2012-04-11 DIAGNOSIS — Z1151 Encounter for screening for human papillomavirus (HPV): Secondary | ICD-10-CM | POA: Insufficient documentation

## 2012-04-11 MED ORDER — ACYCLOVIR 200 MG PO CAPS: ORAL_CAPSULE | ORAL | Status: DC

## 2012-04-11 NOTE — Progress Notes (Addendum)
Patient came to see me today for her annual GYN exam. In 2005 she had ductal carcinoma in situ of her right breast. She was treated with lumpectomy and radiation. She has done well without recurrence. She is up-to-date on her mammograms. In 2003 she had recurrent ascus on Pap smear. She also had atypical glandular cells. Colposcopy with biopsies were all normal. Dr. Guilford Shi  was still very concerned that she might have an endocervical lesion and in spite of a negative ECC we did a conization. Pathology was benign. She has had normal Pap smears since then. Her last Pap smear was 2012. She also had recurrent postmenopausal bleeding. At one point she had an endometrial polyp but after excision she continued to have abnormal bleeding. She therefore underwent an Endometrial ablation in 2009  and Has had no bleeding since then. She has osteopenia on bone density without an elevated fracture risk. Her last bone density was 2011 and she will schedule followup. She has hyperthyroidism and continues to see the endocrinologist. She has a history of herpes progenitalis and has been on Zovirax. She has been able to taper to 3 times a week but gets recurrences if she tapers it  More.  Physical examination:Jaclyn Lopez present. HEENT within normal limits. Neck: Thyroid not large. No masses. Supraclavicular nodes: not enlarged. Breasts: Examined in both sitting and lying  position. No skin changes and no masses. Abdomen: Soft no guarding rebound or masses or hernia. Pelvic: External: Within normal limits. BUS: Within normal limits. Vaginal:within normal limits. Good estrogen effect. No evidence of cystocele rectocele or enterocele. Cervix: clean. Uterus: Normal size and shape. Adnexa: No masses. Rectovaginal exam: Confirmatory and negative. Extremities: Within normal limits.  Assessment: #1. Ductal carcinoma in situ of right breast #2. Osteopenia #3. HSV-2 #4. Abnormal Pap smears without CIN ever identified-normal since  2003 #5. Postmenopausal bleeding-resolved since 2009 with endometrial ablation.  Plan: Continue yearly mammograms. Continue bone density she will schedule. Continue Zovirax 200 mg 3 times a week. Pap with high-risk HPV typing done.The new Pap smear guidelines were discussed with the patient.   Addendum: Pap smear showed no atypia but high-risk HPV detected. Discussed with patient. She will get a followup Pap with high-risk HPV typing in one year and then consider colposcopy if still positive.

## 2012-04-11 NOTE — Patient Instructions (Signed)
Schedule bone density.    

## 2012-04-12 LAB — URINALYSIS W MICROSCOPIC + REFLEX CULTURE
Bacteria, UA: NONE SEEN
Bilirubin Urine: NEGATIVE
Casts: NONE SEEN
Crystals: NONE SEEN
Glucose, UA: NEGATIVE mg/dL
Hgb urine dipstick: NEGATIVE
Ketones, ur: NEGATIVE mg/dL
Leukocytes, UA: NEGATIVE
Nitrite: NEGATIVE
Protein, ur: NEGATIVE mg/dL
Specific Gravity, Urine: 1.01 (ref 1.005–1.030)
Squamous Epithelial / HPF: NONE SEEN
Urobilinogen, UA: 0.2 mg/dL (ref 0.0–1.0)
pH: 7 (ref 5.0–8.0)

## 2012-04-19 ENCOUNTER — Encounter: Payer: Self-pay | Admitting: Obstetrics and Gynecology

## 2012-06-14 ENCOUNTER — Ambulatory Visit (INDEPENDENT_AMBULATORY_CARE_PROVIDER_SITE_OTHER): Payer: BC Managed Care – PPO

## 2012-06-14 ENCOUNTER — Other Ambulatory Visit: Payer: Self-pay | Admitting: Women's Health

## 2012-06-14 ENCOUNTER — Other Ambulatory Visit: Payer: Self-pay | Admitting: Gynecology

## 2012-06-14 DIAGNOSIS — M8580 Other specified disorders of bone density and structure, unspecified site: Secondary | ICD-10-CM

## 2012-06-14 DIAGNOSIS — M899 Disorder of bone, unspecified: Secondary | ICD-10-CM

## 2012-06-15 ENCOUNTER — Encounter: Payer: Self-pay | Admitting: Gynecology

## 2013-07-31 ENCOUNTER — Other Ambulatory Visit: Payer: Self-pay | Admitting: Obstetrics and Gynecology

## 2013-08-28 ENCOUNTER — Ambulatory Visit (INDEPENDENT_AMBULATORY_CARE_PROVIDER_SITE_OTHER): Payer: BC Managed Care – PPO | Admitting: Gynecology

## 2013-08-28 ENCOUNTER — Other Ambulatory Visit (HOSPITAL_COMMUNITY)
Admission: RE | Admit: 2013-08-28 | Discharge: 2013-08-28 | Disposition: A | Discharge status: Home / Self Care | Payer: BC Managed Care – PPO | Source: Ambulatory Visit | Attending: Gynecology | Admitting: Gynecology

## 2013-08-28 ENCOUNTER — Encounter: Payer: Self-pay | Admitting: Gynecology

## 2013-08-28 VITALS — BP 120/76 | Ht 66.0 in | Wt 143.0 lb

## 2013-08-28 DIAGNOSIS — B009 Herpesviral infection, unspecified: Secondary | ICD-10-CM

## 2013-08-28 DIAGNOSIS — M8580 Other specified disorders of bone density and structure, unspecified site: Secondary | ICD-10-CM

## 2013-08-28 DIAGNOSIS — M949 Disorder of cartilage, unspecified: Secondary | ICD-10-CM

## 2013-08-28 DIAGNOSIS — R87810 Cervical high risk human papillomavirus (HPV) DNA test positive: Secondary | ICD-10-CM

## 2013-08-28 DIAGNOSIS — M899 Disorder of bone, unspecified: Secondary | ICD-10-CM

## 2013-08-28 DIAGNOSIS — Z124 Encounter for screening for malignant neoplasm of cervix: Secondary | ICD-10-CM | POA: Insufficient documentation

## 2013-08-28 DIAGNOSIS — Z01419 Encounter for gynecological examination (general) (routine) without abnormal findings: Secondary | ICD-10-CM

## 2013-08-28 DIAGNOSIS — Z1151 Encounter for screening for human papillomavirus (HPV): Secondary | ICD-10-CM | POA: Insufficient documentation

## 2013-08-28 MED ORDER — ACYCLOVIR 200 MG PO CAPS: ORAL_CAPSULE | ORAL | Status: DC

## 2013-08-28 NOTE — Patient Instructions (Signed)
Followup for annual exam in one year, sooner as needed.  You may obtain a copy of any labs that were done today by logging onto MyChart as outlined in the instructions provided with your AVS (after visit summary). The office will not call with normal lab results but certainly if there are any significant abnormalities then we will contact you.   Health Maintenance, Female A healthy lifestyle and preventative care can promote health and wellness.  Maintain regular health, dental, and eye exams.  Eat a healthy diet. Foods like vegetables, fruits, whole grains, low-fat dairy products, and lean protein foods contain the nutrients you need without too many calories. Decrease your intake of foods high in solid fats, added sugars, and salt. Get information about a proper diet from your caregiver, if necessary.  Regular physical exercise is one of the most important things you can do for your health. Most adults should get at least 150 minutes of moderate-intensity exercise (any activity that increases your heart rate and causes you to sweat) each week. In addition, most adults need muscle-strengthening exercises on 2 or more days a week.   Maintain a healthy weight. The body mass index (BMI) is a screening tool to identify possible weight problems. It provides an estimate of body fat based on height and weight. Your caregiver can help determine your BMI, and can help you achieve or maintain a healthy weight. For adults 20 years and older:  A BMI below 18.5 is considered underweight.  A BMI of 18.5 to 24.9 is normal.  A BMI of 25 to 29.9 is considered overweight.  A BMI of 30 and above is considered obese.  Maintain normal blood lipids and cholesterol by exercising and minimizing your intake of saturated fat. Eat a balanced diet with plenty of fruits and vegetables. Blood tests for lipids and cholesterol should begin at age 20 and be repeated every 5 years. If your lipid or cholesterol levels are  high, you are over 50, or you are a high risk for heart disease, you may need your cholesterol levels checked more frequently.Ongoing high lipid and cholesterol levels should be treated with medicines if diet and exercise are not effective.  If you smoke, find out from your caregiver how to quit. If you do not use tobacco, do not start.  Lung cancer screening is recommended for adults aged 55 80 years who are at high risk for developing lung cancer because of a history of smoking. Yearly low-dose computed tomography (CT) is recommended for people who have at least a 30-pack-year history of smoking and are a current smoker or have quit within the past 15 years. A pack year of smoking is smoking an average of 1 pack of cigarettes a day for 1 year (for example: 1 pack a day for 30 years or 2 packs a day for 15 years). Yearly screening should continue until the smoker has stopped smoking for at least 15 years. Yearly screening should also be stopped for people who develop a health problem that would prevent them from having lung cancer treatment.  If you are pregnant, do not drink alcohol. If you are breastfeeding, be very cautious about drinking alcohol. If you are not pregnant and choose to drink alcohol, do not exceed 1 drink per day. One drink is considered to be 12 ounces (355 mL) of beer, 5 ounces (148 mL) of wine, or 1.5 ounces (44 mL) of liquor.  Avoid use of street drugs. Do not share needles with anyone.   Ask for help if you need support or instructions about stopping the use of drugs.  High blood pressure causes heart disease and increases the risk of stroke. Blood pressure should be checked at least every 1 to 2 years. Ongoing high blood pressure should be treated with medicines, if weight loss and exercise are not effective.  If you are 88 to 62 years old, ask your caregiver if you should take aspirin to prevent strokes.  Diabetes screening involves taking a blood sample to check your fasting  blood sugar level. This should be done once every 3 years, after age 77, if you are within normal weight and without risk factors for diabetes. Testing should be considered at a younger age or be carried out more frequently if you are overweight and have at least 1 risk factor for diabetes.  Breast cancer screening is essential preventative care for women. You should practice "breast self-awareness." This means understanding the normal appearance and feel of your breasts and may include breast self-examination. Any changes detected, no matter how small, should be reported to a caregiver. Women in their 26s and 30s should have a clinical breast exam (CBE) by a caregiver as part of a regular health exam every 1 to 3 years. After age 32, women should have a CBE every year. Starting at age 34, women should consider having a mammogram (breast X-ray) every year. Women who have a family history of breast cancer should talk to their caregiver about genetic screening. Women at a high risk of breast cancer should talk to their caregiver about having an MRI and a mammogram every year.  Breast cancer gene (BRCA)-related cancer risk assessment is recommended for women who have family members with BRCA-related cancers. BRCA-related cancers include breast, ovarian, tubal, and peritoneal cancers. Having family members with these cancers may be associated with an increased risk for harmful changes (mutations) in the breast cancer genes BRCA1 and BRCA2. Results of the assessment will determine the need for genetic counseling and BRCA1 and BRCA2 testing.  The Pap test is a screening test for cervical cancer. Women should have a Pap test starting at age 28. Between ages 48 and 67, Pap tests should be repeated every 2 years. Beginning at age 20, you should have a Pap test every 3 years as long as the past 3 Pap tests have been normal. If you had a hysterectomy for a problem that was not cancer or a condition that could lead to  cancer, then you no longer need Pap tests. If you are between ages 55 and 63, and you have had normal Pap tests going back 10 years, you no longer need Pap tests. If you have had past treatment for cervical cancer or a condition that could lead to cancer, you need Pap tests and screening for cancer for at least 20 years after your treatment. If Pap tests have been discontinued, risk factors (such as a new sexual partner) need to be reassessed to determine if screening should be resumed. Some women have medical problems that increase the chance of getting cervical cancer. In these cases, your caregiver may recommend more frequent screening and Pap tests.  The human papillomavirus (HPV) test is an additional test that may be used for cervical cancer screening. The HPV test looks for the virus that can cause the cell changes on the cervix. The cells collected during the Pap test can be tested for HPV. The HPV test could be used to screen women aged 28 years  and older, and should be used in women of any age who have unclear Pap test results. After the age of 85, women should have HPV testing at the same frequency as a Pap test.  Colorectal cancer can be detected and often prevented. Most routine colorectal cancer screening begins at the age of 61 and continues through age 55. However, your caregiver may recommend screening at an earlier age if you have risk factors for colon cancer. On a yearly basis, your caregiver may provide home test kits to check for hidden blood in the stool. Use of a small camera at the end of a tube, to directly examine the colon (sigmoidoscopy or colonoscopy), can detect the earliest forms of colorectal cancer. Talk to your caregiver about this at age 56, when routine screening begins. Direct examination of the colon should be repeated every 5 to 10 years through age 20, unless early forms of pre-cancerous polyps or small growths are found.  Hepatitis C blood testing is recommended for  all people born from [birth year] through 1965 and any individual with known risks for hepatitis C.  Practice safe sex. Use condoms and avoid high-risk sexual practices to reduce the spread of sexually transmitted infections (STIs). Sexually active women aged 68 and younger should be checked for Chlamydia, which is a common sexually transmitted infection. Older women with new or multiple partners should also be tested for Chlamydia. Testing for other STIs is recommended if you are sexually active and at increased risk.  Osteoporosis is a disease in which the bones lose minerals and strength with aging. This can result in serious bone fractures. The risk of osteoporosis can be identified using a bone density scan. Women ages 42 and over and women at risk for fractures or osteoporosis should discuss screening with their caregivers. Ask your caregiver whether you should be taking a calcium supplement or vitamin D to reduce the rate of osteoporosis.  Menopause can be associated with physical symptoms and risks. Hormone replacement therapy is available to decrease symptoms and risks. You should talk to your caregiver about whether hormone replacement therapy is right for you.  Use sunscreen. Apply sunscreen liberally and repeatedly throughout the day. You should seek shade when your shadow is shorter than you. Protect yourself by wearing long sleeves, pants, a wide-brimmed hat, and sunglasses year round, whenever you are outdoors.  Notify your caregiver of new moles or changes in moles, especially if there is a change in shape or color. Also notify your caregiver if a mole is larger than the size of a pencil eraser.  Stay current with your immunizations. Document Released: 11/16/2010 Document Revised: 08/28/2012 Document Reviewed: 11/16/2010 [facility] Patient Information 2014 [place].

## 2013-08-28 NOTE — Progress Notes (Signed)
Jaclyn Lopez [birth year] [id number]        63 y.o.  G2P2002 for annual exam.  Former patient of Dr. Cherylann Banas. Several issues noted below.  Past medical history,surgical history, problem list, medications, allergies, family history and social history were all reviewed and documented as reviewed in the EPIC chart.  ROS:  12 system ROS performed with pertinent positives and negatives included in the history, assessment and plan.  Included Systems: General, HEENT, Neck, Cardiovascular, Pulmonary, Gastrointestinal, Genitourinary, Musculoskeletal, Dermatologic, Endocrine, Hematological, Neurologic, Psychiatric Additional significant findings : None   Exam: [name] assistant Filed Vitals:   08/28/13 1530  BP: 120/76  Height: 5\' 6"  (1.676 m)  Weight: 143 lb (64.864 kg)   General appearance:  Normal affect, orientation and appearance. Skin: Grossly normal HEENT: Normal without gross oral lesions, cervical or supraclavicular adenopathy. Thyroid normal.  Ears/nose appear normal Lungs:  Clear without wheezing, rales or rhonchi Cardiac: RR, without RMG Abdominal:  Soft, nontender, without masses, guarding, rebound, organomegaly or hernia Breasts:  Examined lying and sitting. Right with prior lumpectomy scar, well-healed without masses, retractions, discharge or axillary adenopathy . Left with reduction scars well-healed. Without masses retractions discharge or axillary adenopathy. Pelvic:  Ext/BUS/vagina with generalized atrophic changes.  Cervix normal. Pap/HPV done  Uterus anteverted, normal size, shape and contour, midline and mobile nontender   Adnexa  Without masses or tenderness    Anus and perineum  Normal   Rectovaginal  Normal sphincter tone without palpated masses or tenderness.    Assessment/Plan:  63 y.o. G50P2002 female for annual exam.   1. Postmenopausal/atrophic genital changes. Patient doing well without significant hot flashes, night sweats, vaginal dryness. Is not sexually  active. No vaginal bleeding. Will continue to monitor. Call if any vaginal bleeding. 2. Pap smear 2013 negative cytology but with positive high-risk HPV screen. Was recommended to repeat her Pap smear with HPV in 1 year. Does have a history of atypical glandular cells on Pap smear 2003 ultimately leading to a cone biopsy with negative findings. Pap smears have been normal in the interim. Pap/HPV done today. 3. Osteopenia. DEXA 05/2012 with T score -1.9. FRAX 8.7%/1.1%. Calcium and vitamin D recommendations reviewed. Repeat DEXA next year at two-year interval. 4. Mammography 10/2012. 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 Exam NED. Continue with annual mammography. SBE monthly reviewed. 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 Refill x1 year provided. 6. Colonoscopy due this year and she is in the process of arranging it. 7. Health maintenance. No routine blood work and this is all done through Dr. Shelia Media office. Followup one year, sooner as needed.   Note: This document was prepared with digital dictation and possible smart phrase technology. Any transcriptional errors that result from this process are unintentional.   [name] MD, 5:00 PM 08/28/2013

## 2013-08-31 ENCOUNTER — Encounter: Payer: Self-pay | Admitting: Gynecology

## 2013-10-12 ENCOUNTER — Encounter: Payer: Self-pay | Admitting: Gynecology

## 2013-10-12 ENCOUNTER — Ambulatory Visit (INDEPENDENT_AMBULATORY_CARE_PROVIDER_SITE_OTHER): Payer: BC Managed Care – PPO | Admitting: Gynecology

## 2013-10-12 DIAGNOSIS — R87810 Cervical high risk human papillomavirus (HPV) DNA test positive: Secondary | ICD-10-CM

## 2013-10-12 NOTE — Progress Notes (Signed)
Patient ID: Jaclyn Lopez, female   DOB: 02/24/1951, 63 y.o.   MRN: [id number] Jaclyn Lopez 1950-05-22 [id number]        63 y.o.  G2P2002 presents for colposcopy. Patient has history of atypical glandular cells on Pap smear 2003. Ultimately underwent cone biopsy with negative findings. Pap smears have been normal since then until last year 2014 where she had a Pap smear showing positive HPV but normal cytology. Repeat Pap smear this year 2015 again showed normal cytology but positive HPV screen.  Past medical history,surgical history, problem list, medications, allergies, family history and social history were all reviewed and documented in the EPIC chart.  Directed ROS with pertinent positives and negatives documented in the history of present illness/assessment and plan.  Exam: [name] assistant General appearance  Normal External BUS vagina with atrophic changes. Cervix grossly normal with atrophic changes.  Colposcopy after acetic acid cleanse was adequate using the endocervical speculum and no abnormality seen. An ECC was performed. Physical Exam  Genitourinary:       Assessment/Plan:  63 y.o. [id number] with history as above. Patient will followup for ECC results. If negative then plan repeat Pap smear/HPV screen next year. If otherwise then we'll triage based upon results.   Note: This document was prepared with digital dictation and possible smart phrase technology. Any transcriptional errors that result from this process are unintentional.   [name] MD, 4:11 PM 10/12/2013

## 2013-10-12 NOTE — Patient Instructions (Signed)
Office will call you with biopsy results 

## 2013-10-17 ENCOUNTER — Telehealth: Payer: Self-pay | Admitting: *Deleted

## 2013-10-17 NOTE — Telephone Encounter (Signed)
PT CALLED REQUESTING PATHOLOGY RESULT LEFT ON VOICEMAIL. RESULTS NOT BACK YET.

## 2013-12-19 ENCOUNTER — Encounter: Payer: Self-pay | Admitting: Internal Medicine

## 2013-12-25 ENCOUNTER — Encounter: Payer: Self-pay | Admitting: Internal Medicine

## 2014-01-17 ENCOUNTER — Encounter: Payer: Self-pay | Admitting: Gynecology

## 2014-01-17 ENCOUNTER — Ambulatory Visit (INDEPENDENT_AMBULATORY_CARE_PROVIDER_SITE_OTHER): Payer: BC Managed Care – PPO | Admitting: Gynecology

## 2014-01-17 ENCOUNTER — Other Ambulatory Visit (HOSPITAL_COMMUNITY)
Admission: RE | Admit: 2014-01-17 | Discharge: 2014-01-17 | Disposition: A | Discharge status: Home / Self Care | Payer: BC Managed Care – PPO | Source: Ambulatory Visit | Attending: Gynecology | Admitting: Gynecology

## 2014-01-17 DIAGNOSIS — R87810 Cervical high risk human papillomavirus (HPV) DNA test positive: Secondary | ICD-10-CM | POA: Insufficient documentation

## 2014-01-17 DIAGNOSIS — R8789 Other abnormal findings in specimens from female genital organs: Secondary | ICD-10-CM

## 2014-01-17 DIAGNOSIS — Z124 Encounter for screening for malignant neoplasm of cervix: Secondary | ICD-10-CM | POA: Diagnosis present

## 2014-01-17 DIAGNOSIS — Z1151 Encounter for screening for human papillomavirus (HPV): Secondary | ICD-10-CM | POA: Diagnosis present

## 2014-01-17 NOTE — Progress Notes (Signed)
Jaclyn Lopez Jan 01, 1951 [id number]        63 y.o.  G2P2002 presents for repeat colposcopy and ECC. History of 2 prior Pap smear showing normal cytology but positive HR HPV. Colposcopy 09/2013 was adequate normal with ECC showing "atypical squamous mucosa" cannot rule out dysplastic process. P 16 stain negative. I asked patient return for repeat colposcopy and ECC at three-month interval.  Past medical history,surgical history, problem list, medications, allergies, family history and social history were all reviewed and documented in the EPIC chart.  Directed ROS with pertinent positives and negatives documented in the history of present illness/assessment and plan.  Exam: [name] assistant General appearance:  Normal External BUS vagina normal. Cervix grossly normal. Pap/HPV done  Colposcopy after acetic acid cleanse shows atrophic changes but adequate with transformation zone visualized. ECC performed Physical Exam  Genitourinary:       Assessment/Plan:  63 y.o. [id number] with above history. Followup for Pap smear and ECC results. If normal then planned repeat Pap smear at her next annual exam. If abnormal discussed possible LEEP. I reviewed which involved with the procedure and the risks to include infection, bleeding, damage to surrounding tissue such as vagina bladder rectum. Patient will followup for her biopsy results.   Note: This document was prepared with digital dictation and possible smart phrase technology. Any transcriptional errors that result from this process are unintentional.   [name] MD, 12:10 PM 01/17/2014

## 2014-01-17 NOTE — Patient Instructions (Signed)
Office will call you with biopsy results 

## 2014-01-17 NOTE — Addendum Note (Signed)
Addended by: Nelva Nay on: 01/17/2014 12:17 PM   Modules accepted: Orders

## 2014-01-22 LAB — CYTOLOGY - PAP

## 2014-01-23 ENCOUNTER — Encounter: Payer: Self-pay | Admitting: Gynecology

## 2014-02-21 ENCOUNTER — Ambulatory Visit (AMBULATORY_SURGERY_CENTER): Payer: Self-pay | Admitting: *Deleted

## 2014-02-21 VITALS — Ht 66.5 in | Wt 141.8 lb

## 2014-02-21 DIAGNOSIS — Z1211 Encounter for screening for malignant neoplasm of colon: Secondary | ICD-10-CM

## 2014-02-21 NOTE — Progress Notes (Signed)
No allergies to eggs or soy. No problems with anesthesia.  Pt given Emmi instructions for colonoscopy  No oxygen use  No diet drug use  

## 2014-02-28 ENCOUNTER — Encounter: Payer: Self-pay | Admitting: Internal Medicine

## 2014-03-01 ENCOUNTER — Other Ambulatory Visit: Payer: Self-pay

## 2014-03-07 ENCOUNTER — Encounter: Payer: Self-pay | Admitting: Internal Medicine

## 2014-03-07 ENCOUNTER — Ambulatory Visit (AMBULATORY_SURGERY_CENTER): Payer: BC Managed Care – PPO | Admitting: Internal Medicine

## 2014-03-07 VITALS — BP 119/65 | HR 73 | Temp 97.3°F | Resp 16 | Ht 66.5 in | Wt 141.0 lb

## 2014-03-07 DIAGNOSIS — K5730 Diverticulosis of large intestine without perforation or abscess without bleeding: Secondary | ICD-10-CM

## 2014-03-07 DIAGNOSIS — Z1211 Encounter for screening for malignant neoplasm of colon: Secondary | ICD-10-CM

## 2014-03-07 MED ORDER — SODIUM CHLORIDE 0.9 % IV SOLN: 500.0000 mL | INTRAVENOUS | Status: DC

## 2014-03-07 NOTE — Op Note (Signed)
[facility]  [address] [place], [id number]   COLONOSCOPY PROCEDURE REPORT  PATIENT: Jaclyn, Lopez  MR#: [id number] BIRTHDATE: [birth year] , 56  yrs. old GENDER: female ENDOSCOPIST: Gatha Mayer, MD, [facility] PROCEDURE DATE:  03/07/2014 PROCEDURE:   Colonoscopy, screening First Screening Colonoscopy - Avg.  risk and is 50 yrs.  old or older - No.  Prior Negative Screening - Now for repeat screening. 10 or more years since last screening  History of Adenoma - Now for follow-up colonoscopy & has been > or = to 3 yrs.  N/A  Polyps Removed Today? No.  Polyps Removed Today? No.  Recommend repeat exam, <10 yrs? Polyps Removed Today? No.  Recommend repeat exam, <10 yrs? No. ASA CLASS:   Class II INDICATIONS:average risk for colorectal cancer. MEDICATIONS: Propofol 250 mg IV and Monitored anesthesia care  DESCRIPTION OF PROCEDURE:   After the risks benefits and alternatives of the procedure were thoroughly explained, informed consent was obtained.  The digital rectal exam revealed no abnormalities of the rectum.   The LB [id number]  endoscope was introduced through the anus and advanced to the cecum, which was identified by both the appendix and ileocecal valve. No adverse events experienced.   The quality of the prep was excellent, using MiraLax  The instrument was then slowly withdrawn as the colon was fully examined.      COLON FINDINGS: There was severe diverticulosis noted in sigmoid. The examination was otherwise normal.  Retroflexed views revealed no abnormalities. The time to cecum=3 minutes 15 seconds. Withdrawal time=10 minutes 34 seconds.  The scope was withdrawn and the procedure completed. COMPLICATIONS: There were no immediate complications.  ENDOSCOPIC IMPRESSION: 1.   Severe diverticulosis was noted - sigmoid colon 2.   The examination was otherwise normal - excellent prep - second screening exa,  RECOMMENDATIONS: Repeat colonoscopy  10 years - 2025  eSigned:  Gatha Mayer, MD, [facility] 03/07/2014 9:47 AM   cc: Deland Pretty, MD and The Patient

## 2014-03-07 NOTE — Patient Instructions (Addendum)
No polyps but you do have diverticulosis (not usually a problem).  Next routine colonoscopy in 10 years - 2025  I appreciate the opportunity to care for you. Gatha Mayer, MD, FACG   YOU HAD AN ENDOSCOPIC PROCEDURE TODAY AT [facility]: Refer to the procedure report that was given to you for any specific questions about what was found during the examination.  If the procedure report does not answer your questions, please call your gastroenterologist to clarify.  If you requested that your care partner not be given the details of your procedure findings, then the procedure report has been included in a sealed envelope for you to review at your convenience later.  YOU SHOULD EXPECT: Some feelings of bloating in the abdomen. Passage of more gas than usual.  Walking can help get rid of the air that was put into your GI tract during the procedure and reduce the bloating. If you had a lower endoscopy (such as a colonoscopy or flexible sigmoidoscopy) you may notice spotting of blood in your stool or on the toilet paper. If you underwent a bowel prep for your procedure, then you may not have a normal bowel movement for a few days.  DIET: Your first meal following the procedure should be a light meal and then it is ok to progress to your normal diet.  A half-sandwich or bowl of soup is an example of a good first meal.  Heavy or fried foods are harder to digest and may make you feel nauseous or bloated.  Likewise meals heavy in dairy and vegetables can cause extra gas to form and this can also increase the bloating.  Drink plenty of fluids but you should avoid alcoholic beverages for 24 hours.  ACTIVITY: Your care partner should take you home directly after the procedure.  You should plan to take it easy, moving slowly for the rest of the day.  You can resume normal activity the day after the procedure however you should NOT DRIVE or use heavy machinery for 24 hours (because of the  sedation medicines used during the test).    SYMPTOMS TO REPORT IMMEDIATELY: A gastroenterologist can be reached at any hour.  During normal business hours, 8:30 AM to 5:00 PM Monday through Friday, call [phone].  After hours and on weekends, please call the GI answering service at [phone] who will take a message and have the physician on call contact you.   Following lower endoscopy (colonoscopy or flexible sigmoidoscopy):  Excessive amounts of blood in the stool  Significant tenderness or worsening of abdominal pains  Swelling of the abdomen that is new, acute  Fever of 100F or higher          FOLLOW UP: If any biopsies were taken you will be contacted by phone or by letter within the next 1-3 weeks.  Call your gastroenterologist if you have not heard about the biopsies in 3 weeks.  Our staff will call the home number listed on your records the next business day following your procedure to check on you and address any questions or concerns that you may have at that time regarding the information given to you following your procedure. This is a courtesy call and so if there is no answer at the home number and we have not heard from you through the emergency physician on call, we will assume that you have returned to your regular daily activities without incident.  SIGNATURES/CONFIDENTIALITY: You and/or your  care partner have signed paperwork which will be entered into your electronic medical record.  These signatures attest to the fact that that the information above on your After Visit Summary has been reviewed and is understood.  Full responsibility of the confidentiality of this discharge information lies with you and/or your care-partner.   Information on diverticulosis given to you today

## 2014-03-08 ENCOUNTER — Telehealth: Payer: Self-pay | Admitting: *Deleted

## 2014-03-08 NOTE — Telephone Encounter (Signed)
Name identifier, left message, follow-up 

## 2014-03-18 ENCOUNTER — Encounter: Payer: Self-pay | Admitting: Internal Medicine

## 2014-08-30 ENCOUNTER — Other Ambulatory Visit (HOSPITAL_COMMUNITY)
Admission: RE | Admit: 2014-08-30 | Discharge: 2014-08-30 | Disposition: A | Discharge status: Home / Self Care | Payer: BC Managed Care – PPO | Source: Ambulatory Visit | Attending: Gynecology | Admitting: Gynecology

## 2014-08-30 ENCOUNTER — Ambulatory Visit (INDEPENDENT_AMBULATORY_CARE_PROVIDER_SITE_OTHER): Payer: BC Managed Care – PPO | Admitting: Gynecology

## 2014-08-30 ENCOUNTER — Encounter: Payer: Self-pay | Admitting: Gynecology

## 2014-08-30 VITALS — BP 120/76 | Ht 66.5 in | Wt 138.0 lb

## 2014-08-30 DIAGNOSIS — R87810 Cervical high risk human papillomavirus (HPV) DNA test positive: Secondary | ICD-10-CM | POA: Diagnosis not present

## 2014-08-30 DIAGNOSIS — Z1151 Encounter for screening for human papillomavirus (HPV): Secondary | ICD-10-CM | POA: Insufficient documentation

## 2014-08-30 DIAGNOSIS — B009 Herpesviral infection, unspecified: Secondary | ICD-10-CM

## 2014-08-30 DIAGNOSIS — Z01419 Encounter for gynecological examination (general) (routine) without abnormal findings: Secondary | ICD-10-CM | POA: Insufficient documentation

## 2014-08-30 MED ORDER — ACYCLOVIR 200 MG PO CAPS: ORAL_CAPSULE | ORAL | Status: DC

## 2014-08-30 NOTE — Addendum Note (Signed)
Addended by: Nelva Nay on: 08/30/2014 10:08 AM   Modules accepted: Orders

## 2014-08-30 NOTE — Patient Instructions (Signed)
You may obtain a copy of any labs that were done today by logging onto MyChart as outlined in the instructions provided with your AVS (after visit summary). The office will not call with normal lab results but certainly if there are any significant abnormalities then we will contact you.   Health Maintenance, Female A healthy lifestyle and preventative care can promote health and wellness.  Maintain regular health, dental, and eye exams.  Eat a healthy diet. Foods like vegetables, fruits, whole grains, low-fat dairy products, and lean protein foods contain the nutrients you need without too many calories. Decrease your intake of foods high in solid fats, added sugars, and salt. Get information about a proper diet from your caregiver, if necessary.  Regular physical exercise is one of the most important things you can do for your health. Most adults should get at least 150 minutes of moderate-intensity exercise (any activity that increases your heart rate and causes you to sweat) each week. In addition, most adults need muscle-strengthening exercises on 2 or more days a week.   Maintain a healthy weight. The body mass index (BMI) is a screening tool to identify possible weight problems. It provides an estimate of body fat based on height and weight. Your caregiver can help determine your BMI, and can help you achieve or maintain a healthy weight. For adults 20 years and older:  A BMI below 18.5 is considered underweight.  A BMI of 18.5 to 24.9 is normal.  A BMI of 25 to 29.9 is considered overweight.  A BMI of 30 and above is considered obese.  Maintain normal blood lipids and cholesterol by exercising and minimizing your intake of saturated fat. Eat a balanced diet with plenty of fruits and vegetables. Blood tests for lipids and cholesterol should begin at age 61 and be repeated every 5 years. If your lipid or cholesterol levels are high, you are over 50, or you are a high risk for heart  disease, you may need your cholesterol levels checked more frequently.Ongoing high lipid and cholesterol levels should be treated with medicines if diet and exercise are not effective.  If you smoke, find out from your caregiver how to quit. If you do not use tobacco, do not start.  Lung cancer screening is recommended for adults aged 33 80 years who are at high risk for developing lung cancer because of a history of smoking. Yearly low-dose computed tomography (CT) is recommended for people who have at least a 30-pack-year history of smoking and are a current smoker or have quit within the past 15 years. A pack year of smoking is smoking an average of 1 pack of cigarettes a day for 1 year (for example: 1 pack a day for 30 years or 2 packs a day for 15 years). Yearly screening should continue until the smoker has stopped smoking for at least 15 years. Yearly screening should also be stopped for people who develop a health problem that would prevent them from having lung cancer treatment.  If you are pregnant, do not drink alcohol. If you are breastfeeding, be very cautious about drinking alcohol. If you are not pregnant and choose to drink alcohol, do not exceed 1 drink per day. One drink is considered to be 12 ounces (355 mL) of beer, 5 ounces (148 mL) of wine, or 1.5 ounces (44 mL) of liquor.  Avoid use of street drugs. Do not share needles with anyone. Ask for help if you need support or instructions about stopping  the use of drugs.  High blood pressure causes heart disease and increases the risk of stroke. Blood pressure should be checked at least every 1 to 2 years. Ongoing high blood pressure should be treated with medicines, if weight loss and exercise are not effective.  If you are 59 to 64 years old, ask your caregiver if you should take aspirin to prevent strokes.  Diabetes screening involves taking a blood sample to check your fasting blood sugar level. This should be done once every 3  years, after age 91, if you are within normal weight and without risk factors for diabetes. Testing should be considered at a younger age or be carried out more frequently if you are overweight and have at least 1 risk factor for diabetes.  Breast cancer screening is essential preventative care for women. You should practice "breast self-awareness." This means understanding the normal appearance and feel of your breasts and may include breast self-examination. Any changes detected, no matter how small, should be reported to a caregiver. Women in their 66s and 30s should have a clinical breast exam (CBE) by a caregiver as part of a regular health exam every 1 to 3 years. After age 101, women should have a CBE every year. Starting at age 100, women should consider having a mammogram (breast X-ray) every year. Women who have a family history of breast cancer should talk to their caregiver about genetic screening. Women at a high risk of breast cancer should talk to their caregiver about having an MRI and a mammogram every year.  Breast cancer gene (BRCA)-related cancer risk assessment is recommended for women who have family members with BRCA-related cancers. BRCA-related cancers include breast, ovarian, tubal, and peritoneal cancers. Having family members with these cancers may be associated with an increased risk for harmful changes (mutations) in the breast cancer genes BRCA1 and BRCA2. Results of the assessment will determine the need for genetic counseling and BRCA1 and BRCA2 testing.  The Pap test is a screening test for cervical cancer. Women should have a Pap test starting at age 57. Between ages 25 and 35, Pap tests should be repeated every 2 years. Beginning at age 37, you should have a Pap test every 3 years as long as the past 3 Pap tests have been normal. If you had a hysterectomy for a problem that was not cancer or a condition that could lead to cancer, then you no longer need Pap tests. If you are  between ages 50 and 76, and you have had normal Pap tests going back 10 years, you no longer need Pap tests. If you have had past treatment for cervical cancer or a condition that could lead to cancer, you need Pap tests and screening for cancer for at least 20 years after your treatment. If Pap tests have been discontinued, risk factors (such as a new sexual partner) need to be reassessed to determine if screening should be resumed. Some women have medical problems that increase the chance of getting cervical cancer. In these cases, your caregiver may recommend more frequent screening and Pap tests.  The human papillomavirus (HPV) test is an additional test that may be used for cervical cancer screening. The HPV test looks for the virus that can cause the cell changes on the cervix. The cells collected during the Pap test can be tested for HPV. The HPV test could be used to screen women aged 44 years and older, and should be used in women of any age  who have unclear Pap test results. After the age of 55, women should have HPV testing at the same frequency as a Pap test.  Colorectal cancer can be detected and often prevented. Most routine colorectal cancer screening begins at the age of 44 and continues through age 20. However, your caregiver may recommend screening at an earlier age if you have risk factors for colon cancer. On a yearly basis, your caregiver may provide home test kits to check for hidden blood in the stool. Use of a small camera at the end of a tube, to directly examine the colon (sigmoidoscopy or colonoscopy), can detect the earliest forms of colorectal cancer. Talk to your caregiver about this at age 86, when routine screening begins. Direct examination of the colon should be repeated every 5 to 10 years through age 13, unless early forms of pre-cancerous polyps or small growths are found.  Hepatitis C blood testing is recommended for all people born from [birth year] through 1964 and any  individual with known risks for hepatitis C.  Practice safe sex. Use condoms and avoid high-risk sexual practices to reduce the spread of sexually transmitted infections (STIs). Sexually active women aged 36 and younger should be checked for Chlamydia, which is a common sexually transmitted infection. Older women with new or multiple partners should also be tested for Chlamydia. Testing for other STIs is recommended if you are sexually active and at increased risk.  Osteoporosis is a disease in which the bones lose minerals and strength with aging. This can result in serious bone fractures. The risk of osteoporosis can be identified using a bone density scan. Women ages 20 and over and women at risk for fractures or osteoporosis should discuss screening with their caregivers. Ask your caregiver whether you should be taking a calcium supplement or vitamin D to reduce the rate of osteoporosis.  Menopause can be associated with physical symptoms and risks. Hormone replacement therapy is available to decrease symptoms and risks. You should talk to your caregiver about whether hormone replacement therapy is right for you.  Use sunscreen. Apply sunscreen liberally and repeatedly throughout the day. You should seek shade when your shadow is shorter than you. Protect yourself by wearing long sleeves, pants, a wide-brimmed hat, and sunglasses year round, whenever you are outdoors.  Notify your caregiver of new moles or changes in moles, especially if there is a change in shape or color. Also notify your caregiver if a mole is larger than the size of a pencil eraser.  Stay current with your immunizations. Document Released: 11/16/2010 Document Revised: 08/28/2012 Document Reviewed: 11/16/2010 [facility] Patient Information 2014 [employer].

## 2014-08-30 NOTE — Progress Notes (Signed)
Jaclyn Lopez 1951-04-19 [id number]        63 y.o.  G2P2002 for annual exam.  Several issues noted below.  Past medical history,surgical history, problem list, medications, allergies, family history and social history were all reviewed and documented as reviewed in the EPIC chart.  ROS:  Performed with pertinent positives and negatives included in the history, assessment and plan.   Additional significant findings :  none   Exam: [name] Counsellor Vitals:   08/30/14 0923  BP: 120/76  Height: 5' 6.5" (1.689 m)  Weight: 138 lb (62.596 kg)   General appearance:  Normal affect, orientation and appearance. Skin: Grossly normal HEENT: Without gross lesions.  No cervical or supraclavicular adenopathy. Thyroid normal.  Lungs:  Clear without wheezing, rales or rhonchi Cardiac: RR, without RMG Abdominal:  Soft, nontender, without masses, guarding, rebound, organomegaly or hernia Breasts:  Examined lying and sitting. Right with prior lumpectomy scar, without masses, retractions, discharge or axillary adenopathy.  Left with reduction scars well-healed. No masses, retractions, discharge or axillary adenopathy Pelvic:  Ext/BUS/vagina with atrophic changes  Cervix with atrophic changes. Pap smear/HPV  Uterus anteverted, normal size, shape and contour, midline and mobile nontender   Adnexa  Without masses or tenderness    Anus and perineum  Normal   Rectovaginal  Normal sphincter tone without palpated masses or tenderness.    Assessment/Plan:  64 y.o. G37P2002 female for annual exam.   1. Postmenopausal/atrophic genital changes. Patient without significant symptoms of hot flushes, night sweats, vaginal dryness. No vaginal bleeding. Continue to monitor. Report any vaginal bleeding. 2. History of positive HPV.  Had atypical glandular cells on Pap smear 2003. Ultimately underwent cone biopsy with negative findings. Pap smears have been normal until 2014 where Pap smear showed positive HPV  with normal cytology. Repeat Pap smear 2015 showed normal cytology with positive HPV. Underwent colposcopy which was adequate normal and ECC showed atypical squamous mucosa. P 16 staining was negative. Follow up colposcopy 3 months later was normal adequate with ECC showing benign mucosa.  Pap smear with HPV done today. Follow for results. 3. 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  Exam NED. Mammography 10/2013. Patient will schedule is coming June. SBE monthly reviewed. 4. Osteopenia. DEXA 05/2012 T score -1.9 FRAX 8.7%/1.1%. Repeat DEXA now at 2 year interval and patient will schedule. 5. History of genital herpes. Uses acyclovir 200 mg 3 times weekly which suppresses recurrences. Refill 1 year provided. 6. Colonoscopy 2015. Repeat at their recommended interval. 7. Health maintenance. No routine blood work done as this is done at Dr. Pennie Banter office.  Follow up in one year, sooner as needed.     [name] MD, 9:58 AM 08/30/2014

## 2014-08-31 LAB — URINALYSIS W MICROSCOPIC + REFLEX CULTURE
Bacteria, UA: NONE SEEN
Bilirubin Urine: NEGATIVE
Casts: NONE SEEN
Crystals: NONE SEEN
Glucose, UA: NEGATIVE mg/dL
Hgb urine dipstick: NEGATIVE
Ketones, ur: NEGATIVE mg/dL
Leukocytes, UA: NEGATIVE
Nitrite: NEGATIVE
Protein, ur: NEGATIVE mg/dL
Specific Gravity, Urine: 1.015 (ref 1.005–1.030)
Squamous Epithelial / LPF: NONE SEEN
Urobilinogen, UA: 0.2 mg/dL (ref 0.0–1.0)
pH: 5.5 (ref 5.0–8.0)

## 2014-09-03 ENCOUNTER — Other Ambulatory Visit: Payer: Self-pay | Admitting: Gynecology

## 2014-09-03 DIAGNOSIS — M8580 Other specified disorders of bone density and structure, unspecified site: Secondary | ICD-10-CM

## 2014-09-03 LAB — CYTOLOGY - PAP

## 2014-09-05 ENCOUNTER — Telehealth: Payer: Self-pay | Admitting: *Deleted

## 2014-09-05 NOTE — Telephone Encounter (Signed)
Pt called requesting pap results from [facility] 08/30/14. HPV high risk was detected on pap. Please advise

## 2014-09-06 NOTE — Telephone Encounter (Signed)
Caryn Bee spoke with patient regarding this it was in result note.

## 2014-09-15 DIAGNOSIS — M8580 Other specified disorders of bone density and structure, unspecified site: Secondary | ICD-10-CM

## 2014-09-15 HISTORY — DX: Other specified disorders of bone density and structure, unspecified site: M85.80

## 2014-09-24 ENCOUNTER — Ambulatory Visit (INDEPENDENT_AMBULATORY_CARE_PROVIDER_SITE_OTHER): Payer: BC Managed Care – PPO

## 2014-09-24 ENCOUNTER — Other Ambulatory Visit: Payer: Self-pay | Admitting: Gynecology

## 2014-09-24 DIAGNOSIS — M8580 Other specified disorders of bone density and structure, unspecified site: Secondary | ICD-10-CM

## 2014-09-24 DIAGNOSIS — Z13820 Encounter for screening for osteoporosis: Secondary | ICD-10-CM | POA: Diagnosis not present

## 2014-09-25 ENCOUNTER — Encounter: Payer: Self-pay | Admitting: Gynecology

## 2014-12-27 ENCOUNTER — Encounter: Payer: Self-pay | Admitting: Gynecology

## 2015-03-24 ENCOUNTER — Encounter: Payer: Self-pay | Admitting: Gynecology

## 2015-03-24 ENCOUNTER — Ambulatory Visit (INDEPENDENT_AMBULATORY_CARE_PROVIDER_SITE_OTHER): Payer: BC Managed Care – PPO | Admitting: Gynecology

## 2015-03-24 ENCOUNTER — Other Ambulatory Visit (HOSPITAL_COMMUNITY)
Admission: RE | Admit: 2015-03-24 | Discharge: 2015-03-24 | Disposition: A | Discharge status: Home / Self Care | Payer: BC Managed Care – PPO | Source: Ambulatory Visit | Attending: Gynecology | Admitting: Gynecology

## 2015-03-24 VITALS — BP 118/76

## 2015-03-24 DIAGNOSIS — R300 Dysuria: Secondary | ICD-10-CM

## 2015-03-24 DIAGNOSIS — Z1151 Encounter for screening for human papillomavirus (HPV): Secondary | ICD-10-CM | POA: Insufficient documentation

## 2015-03-24 DIAGNOSIS — N3010 Interstitial cystitis (chronic) without hematuria: Secondary | ICD-10-CM

## 2015-03-24 DIAGNOSIS — R87810 Cervical high risk human papillomavirus (HPV) DNA test positive: Secondary | ICD-10-CM | POA: Diagnosis not present

## 2015-03-24 DIAGNOSIS — Z01411 Encounter for gynecological examination (general) (routine) with abnormal findings: Secondary | ICD-10-CM | POA: Insufficient documentation

## 2015-03-24 LAB — URINALYSIS W MICROSCOPIC + REFLEX CULTURE
Bacteria, UA: NONE SEEN [HPF]
Bilirubin Urine: NEGATIVE
Casts: NONE SEEN [LPF]
Crystals: NONE SEEN [HPF]
Glucose, UA: NEGATIVE
Hgb urine dipstick: NEGATIVE
Ketones, ur: NEGATIVE
Leukocytes, UA: NEGATIVE
Nitrite: NEGATIVE
Protein, ur: NEGATIVE
RBC / HPF: NONE SEEN RBC/HPF (ref <=2)
Specific Gravity, Urine: 1.01 (ref 1.001–1.035)
WBC, UA: NONE SEEN WBC/HPF (ref <=5)
Yeast: NONE SEEN [HPF]
pH: 6 (ref 5.0–8.0)

## 2015-03-24 NOTE — Progress Notes (Signed)
Jaclyn Lopez Sep 10, 1950 [id number]        64 y.o.  G2P2002 presents for Pap smear. Had atypical glandular cells on Pap smear 2003. Ultimately underwent cone biopsy with negative findings. Pap smears have been normal until 2014 where Pap smear showed positive HPV with normal cytology. Repeat Pap smear 2015 showed normal cytology with positive HPV. Underwent colposcopy which was adequate normal and ECC showed atypical squamous mucosa. P 16 staining was negative. Follow up colposcopy 3 months later was normal adequate with ECC showing benign mucosa.  Pap smear with HPV 08/2014 showed normal cytology with positive high-risk HPV, negative subtype 16, 18/45.   Patient also notes that the  Last month or so having been treated several times for UTI by her primary physician but continues to have symptoms of frequency dysuria. She does have a history of interstitial cystitis. No fever, chills or low back pain.   Past medical history,surgical history, problem list, medications, allergies, family history and social history were all reviewed and documented in the EPIC chart.  Directed ROS with pertinent positives and negatives documented in the history of present illness/assessment and plan.  Exam: [name] assistant Filed Vitals:   03/24/15 1025  BP: 118/76   General appearance:  Normal Spine straight without CVA tenderness Abdomen soft nontender without masses guarding rebound Pelvic external BUS vagina with atrophic changes. Cervix grossly normal. Pap smear/HPV done. Uterus normal size midline mobile nontender. Adnexa without masses or tenderness  Assessment/Plan:  64 y.o. [id number] with:  1. Pap smear history as above.  Pap smear/HPV be done today. Will triage based on results. 2. Frequency and dysuria. Urinalysis is negative. Continued symptoms despite several courses of antibiotics. Suspect interstitial cystitis. Patient is being followed by urology and she is going to call to make an appointment to  see them if her symptoms persist.     [name] MD, 10:42 AM 03/24/2015

## 2015-03-24 NOTE — Patient Instructions (Signed)
Follow up with urology if your bladder symptoms continue. Call within 2 weeks to follow up on your Pap smear results

## 2015-03-24 NOTE — Addendum Note (Signed)
Addended by: Nelva Nay on: 03/24/2015 11:15 AM   Modules accepted: Orders

## 2015-03-25 LAB — CYTOLOGY - PAP

## 2015-03-26 ENCOUNTER — Encounter: Payer: Self-pay | Admitting: Gynecology

## 2015-04-17 DIAGNOSIS — IMO0002 Reserved for concepts with insufficient information to code with codable children: Secondary | ICD-10-CM

## 2015-04-17 HISTORY — DX: Reserved for concepts with insufficient information to code with codable children: IMO0002

## 2015-04-23 ENCOUNTER — Ambulatory Visit (INDEPENDENT_AMBULATORY_CARE_PROVIDER_SITE_OTHER): Payer: BC Managed Care – PPO | Admitting: Gynecology

## 2015-04-23 ENCOUNTER — Encounter: Payer: Self-pay | Admitting: Gynecology

## 2015-04-23 VITALS — BP 120/76

## 2015-04-23 DIAGNOSIS — R87810 Cervical high risk human papillomavirus (HPV) DNA test positive: Secondary | ICD-10-CM | POA: Diagnosis not present

## 2015-04-23 NOTE — Patient Instructions (Signed)
Office will call you with biopsy results 

## 2015-04-23 NOTE — Progress Notes (Signed)
Jaclyn Lopez 1950-08-15 [id number]        64 y.o.  [id number] with history of atypical glandular cells on Pap smear 2003. Ultimately underwent cone biopsy with negative findings. Pap smears have been normal until 2014 where Pap smear showed positive HPV with normal cytology. Repeat Pap smear 2015 showed normal cytology with positive HPV. Underwent colposcopy which was adequate normal and ECC showed atypical squamous mucosa. P 16 staining was negative. Follow up colposcopy 3 months later was normal adequate with ECC showing benign mucosa.  Pap smear with HPV 08/2014 showed normal cytology with positive high-risk HPV, negative subtype 16, 18/45. most recent Pap smear again showed normal cytology but positive high-risk HPV. Presents today for colposcopy.   Past medical history,surgical history, problem list, medications, allergies, family history and social history were all reviewed and documented in the EPIC chart.  Directed ROS with pertinent positives and negatives documented in the history of present illness/assessment and plan.  Exam: [name] assistant Filed Vitals:   04/23/15 0928  BP: 120/76   General appearance:  Normal Pelvic external BS vagina with atrophic changes. Cervix grossly normal. Uterus normal size midline mobile nontender. Adnexa without masses or tenderness.  Colposcopy after acetic acid cleanse using the endocervical speculum was adequate normal. ECC performed. Patient tolerated well. Physical Exam  Genitourinary:       Assessment/Plan:  64 y.o. [id number] with history as above. I discussed with her the issues of persistent high-risk HPV and normal cytology. Will follow up for ECC results. Assuming negative and plan Pap smear in one year. If otherwise them will triage based upon results.    [name] MD, 9:49 AM 04/23/2015

## 2015-04-28 ENCOUNTER — Encounter: Payer: Self-pay | Admitting: Gynecology

## 2015-05-16 ENCOUNTER — Telehealth: Payer: Self-pay | Admitting: *Deleted

## 2015-05-16 NOTE — Telephone Encounter (Signed)
Pt was told to schedule LEEP, which is scheduled on 05/28/15 she asked if you would please call her for a couple of quick questions. Pt said it will only take about 3 minutes of your time. [id number]

## 2015-05-16 NOTE — Telephone Encounter (Signed)
I returned the patient's phone call. I reviewed her indications for LEEP to include her history of cone biopsy 2003 with subsequent normal Pap smears through 2014. At that point showed a positive HPV with normal cytology. Follow up Pap smear 2015 again showed positive HPV with normal cytology and colposcopy was adequate with ECC showing atypical squamous mucosa P 16 staining negative. Follow up colposcopy 3 months later was adequate normal with ECC showing benign mucosa. Her Pap smear for/2016 again showed normal cytology with positive HPV negative subtype 16/18/45. Most recent Pap smear showed positive HPV negative cytology. 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 I recommendation is to proceed with lead for better sampling of the endocervical canal. I reviewed the procedure to include the intraoperative and postoperative courses. The risks to include bleeding, infection, damage to surrounding tissues including vagina bladder rectum were all reviewed. Possible pathology results to include normal pathology up to and including significant atypia was discussed. Possibilities for hysterectomy ultimately if atypia persists was also discussed. 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 Patient's questions were fully answered and she is comfortable proceeding with procedure as scheduled.

## 2015-05-18 HISTORY — PX: LEEP: SHX91

## 2015-05-28 ENCOUNTER — Encounter: Payer: Self-pay | Admitting: Gynecology

## 2015-05-28 ENCOUNTER — Ambulatory Visit (INDEPENDENT_AMBULATORY_CARE_PROVIDER_SITE_OTHER): Payer: BC Managed Care – PPO | Admitting: Gynecology

## 2015-05-28 VITALS — BP 120/76

## 2015-05-28 DIAGNOSIS — R87810 Cervical high risk human papillomavirus (HPV) DNA test positive: Secondary | ICD-10-CM

## 2015-05-28 DIAGNOSIS — R896 Abnormal cytological findings in specimens from other organs, systems and tissues: Secondary | ICD-10-CM

## 2015-05-28 DIAGNOSIS — IMO0002 Reserved for concepts with insufficient information to code with codable children: Secondary | ICD-10-CM

## 2015-05-28 NOTE — Progress Notes (Signed)
The patient and I reviewed her indications for LEEP: History of atypical glandular cells on Pap smear 2003. Ultimately underwent cone biopsy with negative findings. Pap smears have been normal until 2014 where Pap smear showed positive HPV with normal cytology. Repeat Pap smear 2015 showed normal cytology with positive HPV. Underwent colposcopy which was adequate normal and ECC showed atypical squamous mucosa. P 16 staining was negative. Follow up colposcopy 3 months later was normal adequate with ECC showing benign mucosa.  Pap smear with HPV 08/2014 showed normal cytology with positive high-risk HPV, negative subtype 16, 18/45. Most recent Pap smear again showed normal cytology but positive high-risk HPV with colposcopy 04/2015 adequate normal with ECC showing LGSIL.  I reviewed the procedure with her to include the risks of infection, bleeding requiring retreatment and damage to surrounding tissues including vagina, bladder and rectum.  The various pathology result possibilities were reviewed with her to include pathology found with complete excision, cut through lesions with need for possible future treatments, as well as no pathology found.  She understands that these lesions are HPV related and that we are not eradicating the virus, with possibilities of recurrences in the future. Patient understands and accepts the above.   Procedure:   A pelvic exam was performed and was normal.   The patient was properly grounded and prepared for the procedure. The cervix was visualized with a LEEP speculum cleansed with acetic acid and re-colposcopy performed. The cervix was circumferentially injected with 2% lidocaine solution with epinephrine 1 / 100,000 dilution, a total of 8 cc's were used. The cervix was then stained with Lugol's solution, which allowed for visualization of the transformation zone grossly. The LEEP generator was set at 72 W cutting 31 W coagulation blend one current. Using the 15 x 12 mm LEEP wand,  the LEEP specimen was excised in a single pass. An ECC was performed afterwards.  Ball coagulation was delivered to the LEEP base to achieve hemostasis and prophylactic Monsel's solution was applied. The specimen was cut open at 3 o'clock and pinned open on the cork and sent to pathology. The patient tolerated the procedure well and postoperative instructions were discussed with her and a postoperative instruction sheet was given to her. Patient knows to follow up for pathology results in several days and then we will discuss the long-term plan as far as follow up screening.   [name] MD, 12:34 PM 05/28/2015

## 2015-05-28 NOTE — Addendum Note (Signed)
Addended by: [name] on: 05/28/2015 01:36 PM   Modules accepted: Orders

## 2015-05-28 NOTE — Patient Instructions (Signed)
Loop Electrosurgical Excision Procedure, Care After Refer to this sheet in the next few weeks. These instructions provide you with information on caring for yourself after your procedure. Your caregiver may also give you more specific instructions. Your treatment has been planned according to current medical practices, but problems sometimes occur. Call your caregiver if you have any problems or questions after your procedure. HOME CARE INSTRUCTIONS   Do not use tampons, douche, or have sexual intercourse for 2 weeks or as directed by your caregiver.  Begin normal activities if you have no or minimal cramping or bleeding, unless directed otherwise by your caregiver.  Take your temperature if you feel sick. Write down your temperature on paper, and tell your caregiver if you have a fever.  Take all medicines as directed by your caregiver.  Keep all your follow-up appointments and Pap tests as directed by your caregiver. SEEK IMMEDIATE MEDICAL CARE IF:   You have bleeding that is heavier or longer than a normal menstrual cycle.  You have bleeding that is bright red.  You have blood clots.  You have a fever.  You have increasing cramps or pain not relieved by medicine.  You develop abdominal pain that does not seem to be related to the same area of earlier cramping and pain.  You are lightheaded, unusually weak, or faint.  You develop painful or bloody urination.  You develop a bad smelling vaginal discharge. MAKE SURE YOU:  Understand these instructions.  Will watch your condition.  Will get help right away if you are not doing well or get worse.   This information is not intended to replace advice given to you by your health care provider. Make sure you discuss any questions you have with your health care provider.   Document Released: 01/14/2011 Document Revised: 05/24/2014 Document Reviewed: 01/14/2011 Elsevier Interactive Patient Education 2016 [employer]  

## 2015-06-02 ENCOUNTER — Encounter: Payer: Self-pay | Admitting: Gynecology

## 2015-06-02 ENCOUNTER — Telehealth: Payer: Self-pay

## 2015-06-02 NOTE — Telephone Encounter (Signed)
Patient was informed earlier today of her LEEP report. She wants to know is the high grade dysplasia carcinoma in situ? (She has been reading online she said.)

## 2015-06-03 NOTE — Telephone Encounter (Signed)
How do I explain that to her after I told her yesterday that it was high grade dysplasia?  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 We'll recommend follow up Pap smear at her next annual exam.

## 2015-06-03 NOTE — Telephone Encounter (Signed)
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 Carcinoma in situ is a true precancerous change in this did not approach that. So the bottom line is she had dysplasia, we cut it out plan we will do a Pap smear one year to  Monitor her

## 2015-06-03 NOTE — Telephone Encounter (Signed)
Patient informed. 

## 2015-06-03 NOTE — Telephone Encounter (Signed)
No that bad.  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

## 2015-09-01 ENCOUNTER — Encounter: Payer: BC Managed Care – PPO | Admitting: Gynecology

## 2015-09-21 ENCOUNTER — Other Ambulatory Visit: Payer: Self-pay | Admitting: Gynecology

## 2015-10-16 ENCOUNTER — Encounter: Payer: Self-pay | Admitting: Gynecology

## 2015-10-16 ENCOUNTER — Ambulatory Visit (INDEPENDENT_AMBULATORY_CARE_PROVIDER_SITE_OTHER): Payer: BC Managed Care – PPO | Admitting: Gynecology

## 2015-10-16 VITALS — BP 122/74 | Ht 66.0 in | Wt 141.0 lb

## 2015-10-16 DIAGNOSIS — D0511 Intraductal carcinoma in situ of right breast: Secondary | ICD-10-CM

## 2015-10-16 DIAGNOSIS — A609 Anogenital herpesviral infection, unspecified: Secondary | ICD-10-CM

## 2015-10-16 DIAGNOSIS — R87613 High grade squamous intraepithelial lesion on cytologic smear of cervix (HGSIL): Secondary | ICD-10-CM

## 2015-10-16 DIAGNOSIS — R87810 Cervical high risk human papillomavirus (HPV) DNA test positive: Secondary | ICD-10-CM | POA: Diagnosis not present

## 2015-10-16 DIAGNOSIS — N952 Postmenopausal atrophic vaginitis: Secondary | ICD-10-CM | POA: Diagnosis not present

## 2015-10-16 DIAGNOSIS — Z01419 Encounter for gynecological examination (general) (routine) without abnormal findings: Secondary | ICD-10-CM

## 2015-10-16 MED ORDER — ACYCLOVIR 200 MG PO CAPS: 200.0000 mg | ORAL_CAPSULE | Freq: Every day | ORAL | Status: DC

## 2015-10-16 NOTE — Addendum Note (Signed)
Addended by: Nelva Nay on: 10/16/2015 03:10 PM   Modules accepted: Orders, SmartSet

## 2015-10-16 NOTE — Progress Notes (Signed)
    Jaclyn Lopez 1950-12-23 [id number]        64 y.o.  G2P2002  for annual exam.  Doing well. Several issues noted below.  Past medical history,surgical history, problem list, medications, allergies, family history and social history were all reviewed and documented as reviewed in the EPIC chart.  ROS:  Performed with pertinent positives and negatives included in the history, assessment and plan.   Additional significant findings :  none   Exam: Caryn Bee assistant Filed Vitals:   10/16/15 1427  BP: 122/74  Height: 5\' 6"  (1.676 m)  Weight: 141 lb (63.957 kg)   General appearance:  Normal affect, orientation and appearance. Skin: Grossly normal HEENT: Without gross lesions.  No cervical or supraclavicular adenopathy. Thyroid normal.  Lungs:  Clear without wheezing, rales or rhonchi Cardiac: RR, without RMG Abdominal:  Soft, nontender, without masses, guarding, rebound, organomegaly or hernia Breasts:  Examined lying and sitting without masses, retractions, discharge or axillary adenopathy.  Right with prior lumpectomy scar. Left with reduction scars, well-healed Pelvic:  Ext/BUS/vagina with atrophic changes  Cervix with atrophic changes. Mild LEEP scarring. Pap smear/HPV done  Uterus anteverted, normal size, shape and contour, midline and mobile nontender   Adnexa without masses or tenderness    Anus and perineum normal   Rectovaginal normal sphincter tone without palpated masses or tenderness.    Assessment/Plan:  65 y.o. G32P2002 female for annual exam.   1. Postop possible/atrophic genital changes. Without significant hot flushes, night sweats, vaginal dryness or any vaginal bleeding. Continue to monitor and report any issues or vaginal bleeding. 2. Vulvar irritation. Patient notes to the left of her clitoris a stinging sensation particularly when she bends down or is wearing tight jeans. Exam is totally normal without lesions or pigmented changes. No evidence of  inflammatory changes. Recommended 1% Hydrocort sent cream twice daily to see if this doesn't alleviate the symptoms. Follow up if it persists. 3. History of atypical glandular cells on Pap smear 2003. Ultimately underwent cone biopsy with negative findings. Pap smears have been normal until 2014 where Pap smear showed positive HPV with normal cytology. Repeat Pap smear 2015 showed normal cytology with positive HPV. Underwent colposcopy which was adequate normal and ECC showed atypical squamous mucosa. P 16 staining was negative. Follow up colposcopy 3 months later was normal adequate with ECC showing benign mucosa.  Pap smear with HPV 08/2014 showed normal cytology with positive high-risk HPV, negative subtype 16, 18/45. Most recent Pap smear again showed normal cytology but positive high-risk HPV with colposcopy 04/2015 adequate normal with ECC showing LGSIL. 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 Pap smear with HPV done today. 4. 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  Exam NED. Mammography 12/2014. Continue with annual mammography when due. SBE monthly reviewed. 5. Osteopenia. DEXA 09/2014 T score -1.9. FRAX 8.6%/1.2% plan repeat DEXA next year a two-year interval. 6. History of genital HSV. Uses acyclovir 200 mg 3 times weekly which suppresses her. Refill 1 year provided. 7. Colonoscopy 2015. Repeat at their recommended interval. 8. Health maintenance. No routine lab work done as patient reports is done at her primary physician's office. Follow up 1 year, sooner as needed.    [name] MD, 2:52 PM 10/16/2015

## 2015-10-16 NOTE — Patient Instructions (Signed)

## 2015-10-20 LAB — PAP IG AND HPV HIGH-RISK: HPV DNA High Risk: NOT DETECTED

## 2015-12-29 ENCOUNTER — Encounter: Payer: Self-pay | Admitting: Gynecology

## 2016-07-19 ENCOUNTER — Ambulatory Visit: Payer: BC Managed Care – PPO | Admitting: Podiatry

## 2016-11-01 ENCOUNTER — Encounter: Payer: Self-pay | Admitting: Gynecology

## 2016-11-01 ENCOUNTER — Other Ambulatory Visit: Payer: Self-pay | Admitting: Gynecology

## 2016-11-01 ENCOUNTER — Ambulatory Visit (INDEPENDENT_AMBULATORY_CARE_PROVIDER_SITE_OTHER): Payer: BC Managed Care – PPO | Admitting: Gynecology

## 2016-11-01 VITALS — BP 120/76 | Ht 66.5 in | Wt 145.0 lb

## 2016-11-01 DIAGNOSIS — Z01411 Encounter for gynecological examination (general) (routine) with abnormal findings: Secondary | ICD-10-CM

## 2016-11-01 DIAGNOSIS — N952 Postmenopausal atrophic vaginitis: Secondary | ICD-10-CM | POA: Diagnosis not present

## 2016-11-01 DIAGNOSIS — R87613 High grade squamous intraepithelial lesion on cytologic smear of cervix (HGSIL): Secondary | ICD-10-CM | POA: Diagnosis not present

## 2016-11-01 DIAGNOSIS — C50911 Malignant neoplasm of unspecified site of right female breast: Secondary | ICD-10-CM | POA: Diagnosis not present

## 2016-11-01 DIAGNOSIS — M8580 Other specified disorders of bone density and structure, unspecified site: Secondary | ICD-10-CM | POA: Diagnosis not present

## 2016-11-01 MED ORDER — ACYCLOVIR 200 MG PO CAPS: 200.0000 mg | ORAL_CAPSULE | Freq: Every day | ORAL | 11 refills | Status: DC

## 2016-11-01 NOTE — Progress Notes (Signed)
    Jaclyn Lopez [birth year] [id number]        65 y.o.  G2P2002 for annual exam.    Past medical history,surgical history, problem list, medications, allergies, family history and social history were all reviewed and documented as reviewed in the EPIC chart.  ROS:  Performed with pertinent positives and negatives included in the history, assessment and plan.   Additional significant findings :  None   Exam: Caryn Bee assistant Vitals:   11/01/16 1051  BP: 120/76  Weight: 145 lb (65.8 kg)  Height: 5' 6.5" (1.689 m)   Body mass index is 23.05 kg/m.  General appearance:  Normal affect, orientation and appearance. Skin: Grossly normal HEENT: Without gross lesions.  No cervical or supraclavicular adenopathy. Thyroid normal.  Lungs:  Clear without wheezing, rales or rhonchi Cardiac: RR, without RMG Abdominal:  Soft, nontender, without masses, guarding, rebound, organomegaly or hernia Breasts:  Examined lying and sitting without masses, retractions, discharge or axillary adenopathy.  Well-healed right lumpectomy scar and left reduction scars Pelvic:  Ext, BUS, Vagina: With atrophic changes  Cervix:  With atrophic changes. Pap smear done  Uterus:  Anteverted, normal size, shape and contour, midline and mobile nontender   Adnexa: Without masses or tenderness    Anus and perineum: Normal   Rectovaginal: Normal sphincter tone without palpated masses or tenderness.    Assessment/Plan:  66 y.o. G23P2002 female for annual exam.   1. Postmenopausal/atrophic genital changes. No significant hot flushes, night sweats, vaginal dryness or any vaginal bleeding.  Continue to monitor and report any issues or bleeding. 2. History of atypical glandular cells on Pap smear 2003. Ultimately underwent cone biopsy with negative findings. Pap smears have been normal until 2014 where Pap smear showed positive HPV with normal cytology. Repeat Pap smear 2015 showed normal cytology with positive HPV.  Underwent colposcopy which was adequate normal and ECC showed atypical squamous mucosa. P 16 staining was negative. Follow up colposcopy 3 months later was normal adequate with ECC showing benign mucosa.  Pap smear with HPV 08/2014 showed normal cytology with positive high-risk HPV, negative subtype 16, 18/45. Most recent Pap smear again showed normal cytology but positive high-risk HPV with colposcopy 04/2015 adequate normal with ECC showing LGSIL. 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  Pap smear last year was normal with negative HPV. Pap smear done today. 3. 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 Exam NED. Mammography 12/2015. Continue with annual mammography when due. SBE monthly reviewed. 4. Osteopenia. DEXA 2016 T score -1.9 FRAX 8%/1%. Schedule DEXA now at 2 year interval. 5. Colonoscopy 2015. Repeat at their recommended interval. 6. History of genital HSV. Uses acyclovir 200 mg 3 times daily with good suppression results and wants to continue. Refill 1 year provided. 7. Health maintenance. No routine lab work done as patient does this elsewhere. Follow up 1 year, sooner as needed.   [name] MD, 11:21 AM 11/01/2016

## 2016-11-01 NOTE — Addendum Note (Signed)
Addended by: Nelva Nay on: 11/01/2016 11:28 AM   Modules accepted: Orders

## 2016-11-01 NOTE — Patient Instructions (Signed)
Follow up for bone density as scheduled  Follow up for annual exam in one year 

## 2016-11-03 LAB — PAP IG W/ RFLX HPV ASCU

## 2016-11-25 ENCOUNTER — Ambulatory Visit (INDEPENDENT_AMBULATORY_CARE_PROVIDER_SITE_OTHER): Payer: BC Managed Care – PPO

## 2016-11-25 ENCOUNTER — Other Ambulatory Visit: Payer: Self-pay | Admitting: Gynecology

## 2016-11-25 DIAGNOSIS — M8589 Other specified disorders of bone density and structure, multiple sites: Secondary | ICD-10-CM

## 2016-11-25 DIAGNOSIS — Z13820 Encounter for screening for osteoporosis: Secondary | ICD-10-CM

## 2016-11-25 DIAGNOSIS — M8580 Other specified disorders of bone density and structure, unspecified site: Secondary | ICD-10-CM

## 2016-12-13 ENCOUNTER — Encounter: Payer: Self-pay | Admitting: Gynecology

## 2016-12-29 ENCOUNTER — Encounter: Payer: Self-pay | Admitting: Gynecology

## 2017-01-24 ENCOUNTER — Other Ambulatory Visit: Payer: Self-pay

## 2017-06-16 ENCOUNTER — Encounter: Payer: Self-pay | Admitting: Internal Medicine

## 2017-06-16 ENCOUNTER — Other Ambulatory Visit (INDEPENDENT_AMBULATORY_CARE_PROVIDER_SITE_OTHER): Payer: Medicare Other

## 2017-06-16 ENCOUNTER — Ambulatory Visit: Payer: Medicare Other | Admitting: Internal Medicine

## 2017-06-16 VITALS — BP 108/64 | HR 80 | Ht 66.0 in | Wt 151.0 lb

## 2017-06-16 DIAGNOSIS — R5383 Other fatigue: Secondary | ICD-10-CM | POA: Diagnosis not present

## 2017-06-16 DIAGNOSIS — K219 Gastro-esophageal reflux disease without esophagitis: Secondary | ICD-10-CM

## 2017-06-16 DIAGNOSIS — R7989 Other specified abnormal findings of blood chemistry: Secondary | ICD-10-CM

## 2017-06-16 DIAGNOSIS — R945 Abnormal results of liver function studies: Secondary | ICD-10-CM | POA: Diagnosis not present

## 2017-06-16 LAB — HEPATIC FUNCTION PANEL
ALT: 28 U/L (ref 0–35)
AST: 23 U/L (ref 0–37)
Albumin: 4.2 g/dL (ref 3.5–5.2)
Alkaline Phosphatase: 122 U/L — ABNORMAL HIGH (ref 39–117)
Bilirubin, Direct: 0.1 mg/dL (ref 0.0–0.3)
Total Bilirubin: 0.4 mg/dL (ref 0.2–1.2)
Total Protein: 7.2 g/dL (ref 6.0–8.3)

## 2017-06-16 MED ORDER — OMEPRAZOLE 20 MG PO CPDR: 20.0000 mg | DELAYED_RELEASE_CAPSULE | Freq: Every day | ORAL | 3 refills | Status: DC

## 2017-06-16 MED ORDER — OMEPRAZOLE MAGNESIUM 20 MG PO TBEC: 20.0000 mg | DELAYED_RELEASE_TABLET | ORAL | Status: DC | PRN

## 2017-06-16 NOTE — Progress Notes (Signed)
My Chart note Re LFT's almost NL

## 2017-06-16 NOTE — Progress Notes (Signed)
Jaclyn Lopez 67 y.o. [birth year] [id number]  Assessment & Plan:   Encounter Diagnoses  Name Primary?  . Abnormal LFTs Yes  . Gastroesophageal reflux disease, esophagitis presence not specified   . Fatigue, unspecified type     Cause of abnormal LFT's not clear. Low level abnormalities. Possibilities include primary biliary cholangitis given fatigue and female gender, HCV possible. Does not seem like her meds. Alk phos can be elevated with deteriorated kidney fx - ? If hers sig enough.   Will test her for primary biliary cholangitis/autoimmune phenomenon  and Hep C. Ordered repeat LFTs, Hep C AB, mitochondial AB, and ANA.  Pt started on Omeprazole 20 mg QD for GERD. She takes Synthroid in Jaclyn morning and is not supposed to take other meds with it. Recommended her to consult with her pharmacist on this matter re best timing  Follow up pending lab results.  I have personally seen Jaclyn patient, reviewed and repeated key elements of Jaclyn history and physical and participated in formation of Jaclyn assessment and plan Jaclyn Jaclyn Lopez has documented.   Jaclyn Mayer, Jaclyn Lopez, Jaclyn Lopez  Jaclyn Lopez, Jaclyn Jew, Jaclyn Lopez    Subjective:   Chief Complaint: abnormal LFTs + heartburn  HPI Pt is a very nice, well-preserved 67 yo caucasian female presenting to Jaclyn office today following abnormal LFTs on her yearly physical in December 2018. ALT 35, ALP 142, GGT 107 liver US was performed which showed cholecystolithiasis (4.5 mm) without evidence of cholecystitis, choledocholithiasis, GB wall thickening or distention and normal liver. Alk phos in 11/208 was 144, other LFT's NL. CBC was NL  Pt has not had any sxs other than nausea in Jaclyn mornings and chronic heart burn sxs. She does report one episode of RUQ pain a year ago which resolved on its own without further complications. Her heart burn sxs occur fairly regularly, worse after spicy/fatty meals, pain travels up, reports increased belching. When sx becomes  unbearable she takes OTC Prilosec which provides relief. Denies loss of appetite, vomiting, diarrhea, constipation, weight loss. Drinks 1 decaffeinated coffee in Jaclyn mornings, no cigarettes, etoh, or other drug use. 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 Rare alcohol.  Allergies  Allergen Reactions  . Symmetrel [Amantadine Hcl] Hives  . Tamiflu [Oseltamivir Phosphate] Hives   Current Meds  Medication Sig  . acyclovir (ZOVIRAX) 200 MG capsule Take 1 capsule (200 mg total) by mouth daily. (Patient taking differently: Take 200 mg by mouth as directed. Three times weekly)  . BIOTIN PO Take by mouth daily.   . Cholecalciferol (VITAMIN D PO) Take 1,000 Units by mouth.    Marland Kitchen FLUoxetine (PROZAC) 20 MG capsule Take 20 mg by mouth daily.   Marland Kitchen levothyroxine (SYNTHROID, LEVOTHROID) 50 MCG tablet Take 25 mcg by mouth daily.   . mirtazapine (REMERON) 15 MG tablet Take 15 mg by mouth at bedtime. Take 3 mg  . omeprazole (PRILOSEC OTC) 20 MG tablet Take 1 tablet (20 mg total) by mouth as needed.  . [DISCONTINUED] omeprazole (PRILOSEC OTC) 20 MG tablet Take 20 mg by mouth as needed.   Past Medical History:  Diagnosis Date  . Atypical glandular cells on Pap smear 2003  . Cancer ([place]) 2005   Breast-Ductal CIS Right breast-Radiation  . CKD (chronic kidney disease)   . Depression   . Herpes progenitalis   . HPV in female 2014/2015/2016   Normal cytology but positive HPV  x3 normal colposcopy/negative Jaclyn ECC  . Hyperthyroidism   . Hypothyroidism   . IC (interstitial cystitis)   . Insomnia   . 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 Subsequent LEEP showed CIN-2 with clear margins and negative ECC  . Osteopenia 11/2016   T score -1.9 FRAX 9.2%/1.4%  . Thyroid disease    Hyperthyroid   Past Surgical History:  Procedure Laterality Date  . Bladder stretch and Bx  1990  . BREAST LUMPECTOMY   2005   right; cancer  . BREAST LUMPECTOMY  2006   left; pre cancer  . BREAST SURGERY     Reduction  . BUNIONECTOMY  2007, 2011  . CERVICAL CONE BIOPSY  2003  . CESAREAN SECTION  1983  . DILATION AND CURETTAGE OF UTERUS  2009  . ENDOMETRIAL ABLATION     Novasure  . GANGLION CYST EXCISION Left 1985  . HYSTEROSCOPY  2009  . LEEP  05/2015   CIN-2 with clear margins  . MASTECTOMY, PARTIAL    . MOUTH SURGERY  2012   implants  . TUBAL LIGATION  1984   Social History   Social History Narrative   Divorced   rare EtOH, no tobacco, drugs   Teached preK at [employer]   family history includes Bipolar disorder in her mother; Breast cancer in her maternal grandmother; COPD in her mother; Dementia in her mother; Lung cancer in her father.   Review of Systems Positive for nausea, heart burn, belching, fatigue. Negative for vomiting, diarrhea, constipation, steatorrhea, abdominal pain, jaundice. Negative for all other systems.  Objective:   Physical Exam  '@BP'  108/64   Pulse 80   Ht '5\' 6"'  (1.676 m)   Wt 151 lb (68.5 kg)   BMI 24.37 kg/m @  General:  Well-developed, well-nourished and in no acute distress Eyes:  anicteric. Lungs: Clear to auscultation bilaterally. Heart:  S1S2, no rubs, murmurs, gallops. Abdomen:  soft, non-tender, no hepatosplenomegaly, hernia, or mass and BS+.  Extremities:   no edema, cyanosis or clubbing Skin   no rash. No signs of liver disease Neuro:  A&O x 3.  Psych:  appropriate mood and  Affect.   Data Reviewed: PCP notes, labs, meds, [place].

## 2017-06-16 NOTE — Patient Instructions (Signed)
We have sent the following medications to your pharmacy for you to pick up at your convenience: Omeprazole  Your physician has requested that you go to the basement for the lab work before leaving today.   Today we are giving you a handout to read on GERD.   Follow up will be arranged after your lab results are reviewed.   I appreciate the opportunity to care for you. Silvano Rusk, MD, [facility]

## 2017-06-20 LAB — ANTI-NUCLEAR AB-TITER (ANA TITER): ANA Titer 1: 1:40 {titer} — ABNORMAL HIGH

## 2017-06-20 LAB — MITOCHONDRIAL ANTIBODIES: Mitochondrial M2 Ab, IgG: <=20 U

## 2017-06-20 LAB — HEPATITIS C ANTIBODY
Hepatitis C Ab: NONREACTIVE
SIGNAL TO CUT-OFF: 0.02 (ref <1.00)

## 2017-06-20 LAB — ANA: Anti Nuclear Antibody(ANA): POSITIVE — AB

## 2017-06-22 NOTE — Progress Notes (Signed)
Let her know that the serologic evaluation showed a weakly + ANA  I consider it to be nonspecific and would not investigate further at this time  The other tests were all negative and her alkaline phopshatase test was almost back to normal  I recommend that she repeat LFT's in 2 months dx elevated alk phos and I will contact her then again  Please cc the lab results and this info to Dr. Shelia Media her PCP

## 2017-06-23 ENCOUNTER — Telehealth: Payer: Self-pay | Admitting: Internal Medicine

## 2017-06-23 ENCOUNTER — Other Ambulatory Visit: Payer: Self-pay

## 2017-06-23 DIAGNOSIS — R748 Abnormal levels of other serum enzymes: Secondary | ICD-10-CM

## 2017-06-23 NOTE — Telephone Encounter (Signed)
See lab results notes for additional details.

## 2017-06-23 NOTE — Progress Notes (Signed)
hepat

## 2017-11-02 ENCOUNTER — Encounter: Payer: Self-pay | Admitting: Gynecology

## 2017-11-02 ENCOUNTER — Ambulatory Visit: Payer: Medicare Other | Admitting: Gynecology

## 2017-11-02 VITALS — BP 116/72 | Ht 66.5 in | Wt 153.0 lb

## 2017-11-02 DIAGNOSIS — N952 Postmenopausal atrophic vaginitis: Secondary | ICD-10-CM | POA: Diagnosis not present

## 2017-11-02 DIAGNOSIS — Z87410 Personal history of cervical dysplasia: Secondary | ICD-10-CM | POA: Diagnosis not present

## 2017-11-02 DIAGNOSIS — M8580 Other specified disorders of bone density and structure, unspecified site: Secondary | ICD-10-CM | POA: Diagnosis not present

## 2017-11-02 DIAGNOSIS — Z01419 Encounter for gynecological examination (general) (routine) without abnormal findings: Secondary | ICD-10-CM | POA: Diagnosis not present

## 2017-11-02 NOTE — Addendum Note (Signed)
Addended by: Nelva Nay on: 11/02/2017 03:51 PM   Modules accepted: Orders

## 2017-11-02 NOTE — Progress Notes (Signed)
    Jaclyn Lopez 05/01/1951 [id number]        66 y.o.  G2P2002 for annual gynecologic exam.  Doing well without complaints.  Past medical history,surgical history, problem list, medications, allergies, family history and social history were all reviewed and documented as reviewed in the EPIC chart.  ROS:  Performed with pertinent positives and negatives included in the history, assessment and plan.   Additional significant findings : None   Exam: Caryn Bee assistant Vitals:   11/02/17 1425  BP: 116/72  Weight: 153 lb (69.4 kg)  Height: 5' 6.5" (1.689 m)   Body mass index is 24.32 kg/m.  General appearance:  Normal affect, orientation and appearance. Skin: Grossly normal HEENT: Without gross lesions.  No cervical or supraclavicular adenopathy. Thyroid normal.  Lungs:  Clear without wheezing, rales or rhonchi Cardiac: RR, without RMG Abdominal:  Soft, nontender, without masses, guarding, rebound, organomegaly or hernia Breasts:  Examined lying and sitting without masses, retractions, discharge or axillary adenopathy. Pelvic:  Ext, BUS, Vagina: Normal with atrophic changes  Cervix: With atrophic changes/scarring from her LEEP.  Pap smear done  Uterus: Anteverted, normal size, shape and contour, midline and mobile nontender   Adnexa: Without masses or tenderness    Anus and perineum: Normal   Rectovaginal: Normal sphincter tone without palpated masses or tenderness.    Assessment/Plan:  67 y.o. G51P2002 female for annual gynecologic exam.   1. Postmenopausal/atrophic genital changes.  No significant hot flushes, night sweats, vaginal dryness or any bleeding. 2. History of atypical glandular cells on Pap smear 2003. Ultimately underwent cone biopsy with negative findings. Pap smears have been normal until 2014 where Pap smear showed positive HPV with normal cytology. Repeat Pap smear 2015 showed normal cytology with positive HPV. Underwent colposcopy which was adequate  normal and ECC showed atypical squamous mucosa. P 16 staining was negative. Follow up colposcopy 3 months later was normal adequate with ECC showing benign mucosa.  Pap smear with HPV 08/2014 showed normal cytology with positive high-risk HPV, negative subtype 16, 18/45. Most recent Pap smear again showed normal cytology but positive high-risk HPV with colposcopy 04/2015 adequate normal with ECC showing LGSIL. 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  Pap smear 2017 normal with negative HPV. Pap smear 2018 normal.  Pap smear done today.  Need to continue Pap smears for 20 years after the LEEP for high-grade dysplasia discussed with her. 3. Osteopenia.  DEXA 2018 T score -1.9 FRAX 9% / 1.4%.  Plan repeat DEXA next year at 2-year interval. 4. Mammography 12/2016.  Continue with annual mammography when due.  Breast exam normal today. 5. Colonoscopy 2015.  Repeat at their recommended interval. 6. Health maintenance.  No routine lab work done as patient does this elsewhere.  Follow-up in 1 year, sooner as needed.   [name] MD, 2:51 PM 11/02/2017

## 2017-11-02 NOTE — Patient Instructions (Signed)
Follow-up in 1 year for annual exam, sooner as needed. 

## 2017-11-03 ENCOUNTER — Encounter: Payer: Self-pay | Admitting: Gynecology

## 2017-11-03 LAB — PAP IG W/ RFLX HPV ASCU

## 2018-06-01 ENCOUNTER — Other Ambulatory Visit: Payer: Self-pay | Admitting: Gynecology

## 2018-11-15 ENCOUNTER — Other Ambulatory Visit: Payer: Self-pay

## 2018-11-16 ENCOUNTER — Ambulatory Visit: Payer: Medicare Other | Admitting: Gynecology

## 2018-11-16 ENCOUNTER — Encounter: Payer: Self-pay | Admitting: Gynecology

## 2018-11-16 ENCOUNTER — Other Ambulatory Visit: Payer: Self-pay

## 2018-11-16 VITALS — BP 118/78 | Ht 66.0 in | Wt 149.0 lb

## 2018-11-16 DIAGNOSIS — N952 Postmenopausal atrophic vaginitis: Secondary | ICD-10-CM | POA: Diagnosis not present

## 2018-11-16 DIAGNOSIS — Z01419 Encounter for gynecological examination (general) (routine) without abnormal findings: Secondary | ICD-10-CM | POA: Diagnosis not present

## 2018-11-16 DIAGNOSIS — M8580 Other specified disorders of bone density and structure, unspecified site: Secondary | ICD-10-CM

## 2018-11-16 NOTE — Patient Instructions (Signed)
Follow-up for bone density as scheduled  Follow-up for annual exam in 1 year

## 2018-11-16 NOTE — Progress Notes (Signed)
    Jaclyn Lopez August 12, 1950 [id number]        67 y.o.  G2P2002 for annual gynecologic exam.  Doing well without gynecologic complaints  Past medical history,surgical history, problem list, medications, allergies, family history and social history were all reviewed and documented as reviewed in the EPIC chart.  ROS:  Performed with pertinent positives and negatives included in the history, assessment and plan.   Additional significant findings : None  Exam: Caryn Bee assistant Vitals:   11/16/18 1155  BP: 118/78  Weight: 149 lb (67.6 kg)  Height: 5\' 6"  (1.676 m)   Body mass index is 24.05 kg/m.  General appearance:  Normal affect, orientation and appearance. Skin: Grossly normal HEENT: Without gross lesions.  No cervical or supraclavicular adenopathy. Thyroid normal.  Lungs:  Clear without wheezing, rales or rhonchi Cardiac: RR, without RMG Abdominal:  Soft, nontender, without masses, guarding, rebound, organomegaly or hernia Breasts:  Examined lying and sitting without masses, retractions, discharge or axillary adenopathy. Pelvic:  Ext, BUS, Vagina: With atrophic changes  Cervix: With atrophic changes  Uterus: Anteverted, normal size, shape and contour, midline and mobile nontender   Adnexa: Without masses or tenderness    Anus and perineum: Normal   Rectovaginal: Normal sphincter tone without palpated masses or tenderness.    Assessment/Plan:  68 y.o. G51P2002 female for annual gynecologic exam.    1. Postmenopausal.  Without significant menopausal symptoms or any vaginal bleeding. 2. History of abnormal Pap smears as outlined in her office note last year.  Her last 3 annual Pap smears have been normal to include a negative HPV.  No Pap smear was done today.  The need to continue with screening discussed with her despite age and we will plan on doing this every 3 years per current screening guidelines. 3. Osteopenia.  DEXA 2018 T score -1.9.  FRAX 9% / 1.4%.  Plan  repeat DEXA now and she will schedule in follow-up for this. 4. Colonoscopy 2015.  Repeat at their recommended interval. 5. Mammography 12/2017.  Reminded patient to schedule mammogram this coming August.  Breast exam normal today. 6. Health maintenance.  No routine lab work done as patient does this elsewhere.  Follow-up 1 year, sooner as needed.   [name] MD, 12:38 PM 11/16/2018

## 2018-12-04 ENCOUNTER — Other Ambulatory Visit: Payer: Self-pay

## 2018-12-05 ENCOUNTER — Other Ambulatory Visit: Payer: Self-pay | Admitting: Gynecology

## 2018-12-05 ENCOUNTER — Encounter: Payer: Self-pay | Admitting: Gynecology

## 2018-12-05 ENCOUNTER — Ambulatory Visit (INDEPENDENT_AMBULATORY_CARE_PROVIDER_SITE_OTHER): Payer: Medicare Other

## 2018-12-05 DIAGNOSIS — Z780 Asymptomatic menopausal state: Secondary | ICD-10-CM | POA: Diagnosis not present

## 2018-12-05 DIAGNOSIS — M8589 Other specified disorders of bone density and structure, multiple sites: Secondary | ICD-10-CM

## 2018-12-05 DIAGNOSIS — M8580 Other specified disorders of bone density and structure, unspecified site: Secondary | ICD-10-CM

## 2019-01-04 ENCOUNTER — Encounter: Payer: Self-pay | Admitting: Gynecology

## 2019-01-05 ENCOUNTER — Other Ambulatory Visit: Payer: Self-pay

## 2019-01-05 DIAGNOSIS — Z20822 Contact with and (suspected) exposure to covid-19: Secondary | ICD-10-CM

## 2019-01-06 LAB — NOVEL CORONAVIRUS, NAA: SARS-CoV-2, NAA: NOT DETECTED

## 2019-02-21 ENCOUNTER — Encounter: Payer: Self-pay | Admitting: Gynecology

## 2019-03-06 ENCOUNTER — Encounter: Payer: Self-pay | Admitting: Internal Medicine

## 2019-03-09 ENCOUNTER — Encounter: Payer: Self-pay | Admitting: Gastroenterology

## 2019-03-09 ENCOUNTER — Other Ambulatory Visit: Payer: Self-pay

## 2019-03-09 ENCOUNTER — Other Ambulatory Visit: Payer: Medicare Other

## 2019-03-09 ENCOUNTER — Ambulatory Visit: Payer: Medicare Other | Admitting: Gastroenterology

## 2019-03-09 VITALS — BP 112/68 | HR 70 | Temp 98.3°F | Ht 66.0 in | Wt 155.0 lb

## 2019-03-09 DIAGNOSIS — R748 Abnormal levels of other serum enzymes: Secondary | ICD-10-CM | POA: Diagnosis not present

## 2019-03-09 DIAGNOSIS — R112 Nausea with vomiting, unspecified: Secondary | ICD-10-CM | POA: Diagnosis not present

## 2019-03-09 DIAGNOSIS — R195 Other fecal abnormalities: Secondary | ICD-10-CM

## 2019-03-09 DIAGNOSIS — K219 Gastro-esophageal reflux disease without esophagitis: Secondary | ICD-10-CM

## 2019-03-09 NOTE — Patient Instructions (Signed)
If you are age 67 or older, your body mass index should be between 23-30. Your Body mass index is 25.02 kg/m. If this is out of the aforementioned range listed, please consider follow up with your Primary Care Provider.  If you are age 92 or younger, your body mass index should be between 19-25. Your Body mass index is 25.02 kg/m. If this is out of the aformentioned range listed, please consider follow up with your Primary Care Provider.   Your provider has requested that you go to the basement level for lab work before leaving today. Press "B" on the elevator. The lab is located at the first door on the left as you exit the elevator.  You have been scheduled for an abdominal ultrasound at [facility] (1st floor of hospital) on 03/15/19 at 9 am. Please arrive 15 minutes prior to your appointment for registration. Make certain not to have anything to eat or drink after midnight the day prior to your appointment. Should you need to reschedule your appointment, please contact radiology at [phone]. This test typically takes about 30 minutes to perform.  Start Benefiber or Citrucel - 2 tablespoons in 8 ounces of liquid daily.  We will call with results.  Thank you for choosing me and [facility].   Alonza Bogus, PA-C

## 2019-03-09 NOTE — Progress Notes (Addendum)
03/15/2019 Jaclyn Lopez [id number] [birth year]   HISTORY OF PRESENT ILLNESS: This is a pleasant 68 year old female who is a patient of Dr. Celesta Aver.  She is referred here today by her PCP, Dr. Shelia Media, for evaluation regarding nausea, vomiting, reflux/indigestion.  She tells me that about 6 months ago she started having acid reflux.  She has frequent nausea and has vomited after eating fatty type meals.  Her PCP started her on omeprazole 40 mg daily just last week.  She says that she is already noticed improvement.  Has not had any further vomiting but still has some nausea to a degree.  She also reports chronically frequent soft stools.  She says that sometimes she will have 6 or 7 bowel movements in a day.  She says they are soft and semiformed.  Not loose or watery like diarrhea.  Often times they occur in the mornings before she goes to work and often interferes with her being able to leave the house in time.  Colonoscopy October 2015 showed severe diverticulosis in the left colon, but otherwise unremarkable.  While she is here she also mentions that her alk phos level has been elevated.  On most recent labs it was 155.  Other LFTs normal.  It appears that she had some evaluation for this in 2019 by Dr. Carlean Purl.  Had a mildly positive ANA, but other evaluation was unremarkable so no further work-up was pursued.   Past Medical History:  Diagnosis Date  . Atypical glandular cells on Pap smear 2003  . 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 CKD (chronic kidney disease)   . Depression   . Herpes progenitalis   . 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 Hyperthyroidism   . Hypothyroidism   . IC (interstitial cystitis)   . Insomnia   . 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 Subsequent LEEP showed CIN-2 with clear margins and negative ECC  .  Osteopenia 11/2018   T score -1.5 FRAX 9% / 1% improved at both hips stable at spine  . Thyroid disease    Hyperthyroid   Past Surgical History:  Procedure Laterality Date  . Bladder stretch and Bx  1990  . BREAST LUMPECTOMY  2005   right; cancer  . BREAST LUMPECTOMY  2006   left; pre cancer  . BREAST SURGERY     Reduction  . BUNIONECTOMY  2007, 2011  . CERVICAL CONE BIOPSY  2003  . CESAREAN SECTION  1983  . DILATION AND CURETTAGE OF UTERUS  2009  . ENDOMETRIAL ABLATION     Novasure  . GANGLION CYST EXCISION Left 1985  . HYSTEROSCOPY  2009  . LEEP  05/2015   CIN-2 with clear margins  . MASTECTOMY, PARTIAL    . MOUTH SURGERY  2012   implants  . TUBAL LIGATION  1984    reports that she quit smoking about 22 years ago. She has never used smokeless tobacco. She reports current alcohol use. She reports that she does not use drugs. family history includes Bipolar disorder in her mother; Breast cancer in her maternal grandmother; COPD in her mother; Dementia in her mother; Lung cancer in her father. Allergies  Allergen Reactions  . Symmetrel [Amantadine Hcl] Hives  . Tamiflu [Oseltamivir Phosphate] Hives      Outpatient Encounter Medications as of 03/09/2019  Medication Sig  . acyclovir (  ZOVIRAX) 200 MG capsule Take 200 mg by mouth once a week.  Marland Kitchen BIOTIN PO Take by mouth daily.   Marland Kitchen buPROPion (WELLBUTRIN SR) 150 MG 12 hr tablet Take 150 mg by mouth daily.  . Cholecalciferol (VITAMIN D PO) Take 1,000 Units by mouth.    Marland Kitchen FLUoxetine (PROZAC) 20 MG capsule Take 20 mg by mouth daily.   Marland Kitchen levothyroxine (SYNTHROID, LEVOTHROID) 50 MCG tablet Take 25 mcg by mouth daily.   . mirtazapine (REMERON) 15 MG tablet Take 15 mg by mouth at bedtime. Take 3 mg  . omeprazole (PRILOSEC) 20 MG capsule Take 20 mg by mouth daily.   No facility-administered encounter medications on file as of 03/09/2019.      REVIEW OF SYSTEMS  : All other systems reviewed and negative except where noted in the  History of Present Illness.   PHYSICAL EXAM: BP 112/68   Pulse 70   Temp 98.3 F (36.8 C) (Temporal)   Ht '5\' 6"'  (1.676 m)   Wt 155 lb (70.3 kg)   BMI 25.02 kg/m  General: Well developed white female in no acute distress Head: Normocephalic and atraumatic Eyes:  Sclerae anicteric, conjunctiva pink. Ears: Normal auditory acuity Lungs: Clear throughout to auscultation; no increased WOB. Heart: Regular rate and rhythm; no M/R/G. Abdomen: Soft, non-distended.  BS present.  Non-tender. Musculoskeletal: Symmetrical with no gross deformities  Skin: No lesions on visible extremities Extremities: No edema  Neurological: Alert oriented x 4, grossly non-focal Psychological:  Alert and cooperative. Normal mood and affect  ASSESSMENT AND PLAN: *Nausea and vomiting along with complaints of indigestion: Already improved on omeprazole 40 mg daily and has only been taking this for a week.  I think that we will allow her a little bit longer for this medication to work.  We will check right upper quadrant ultrasound as well to rule out gallbladder disease (also to evaluated elevated ALP as below). *Elevated ALP:  Had some evaluation of this in the past with Dr. Carlean Purl (2019).  Mildly positive ANA, but all other evaluation unremarkable.  Now elevated again at 155.  Will check ALP isoenzymes.  Also getting an ultrasound as stated above that will evaluate. *Frequent stools:  Sometimes 6-7 BM's per day, soft not loose or watery.  Will try a daily powder fiber supplement such as Benefiber or Citrucel, 2 Tbsp daily in 8 ounces of liquid to try to help bulk the stools.  Addendum: Received lab results from her PCP that were drawn on October 15.  Amylase, lipase, CBC, and CMP showed the mild elevation in alk phos at 155, mildly elevated lipase at 83 (upper limits of normal being 72 on their scale).  H. pylori study negative.   CC:  Deland Pretty, MD

## 2019-03-14 LAB — ALKALINE PHOSPHATASE ISOENZYMES
Alkaline phosphatase (APISO): 131 U/L (ref 37–153)
Bone Isoenzymes: 42 % (ref 28–66)
Intestinal Isoenzymes: 0 % — ABNORMAL LOW (ref 1–24)
Liver Isoenzymes: 58 % (ref 25–69)

## 2019-03-15 ENCOUNTER — Other Ambulatory Visit: Payer: Self-pay

## 2019-03-15 ENCOUNTER — Encounter: Payer: Self-pay | Admitting: Gastroenterology

## 2019-03-15 ENCOUNTER — Ambulatory Visit (HOSPITAL_COMMUNITY)
Admission: RE | Admit: 2019-03-15 | Discharge: 2019-03-15 | Disposition: A | Discharge status: Home / Self Care | Payer: Medicare Other | Source: Ambulatory Visit | Attending: Gastroenterology | Admitting: Gastroenterology

## 2019-03-15 DIAGNOSIS — K219 Gastro-esophageal reflux disease without esophagitis: Secondary | ICD-10-CM | POA: Insufficient documentation

## 2019-03-15 DIAGNOSIS — R195 Other fecal abnormalities: Secondary | ICD-10-CM | POA: Insufficient documentation

## 2019-03-15 DIAGNOSIS — R112 Nausea with vomiting, unspecified: Secondary | ICD-10-CM | POA: Insufficient documentation

## 2019-03-15 DIAGNOSIS — R748 Abnormal levels of other serum enzymes: Secondary | ICD-10-CM | POA: Insufficient documentation

## 2019-04-30 ENCOUNTER — Ambulatory Visit: Payer: Medicare Other | Admitting: Internal Medicine

## 2019-04-30 ENCOUNTER — Encounter

## 2019-04-30 ENCOUNTER — Encounter: Payer: Self-pay | Admitting: Internal Medicine

## 2019-04-30 ENCOUNTER — Other Ambulatory Visit (INDEPENDENT_AMBULATORY_CARE_PROVIDER_SITE_OTHER): Payer: Medicare Other

## 2019-04-30 VITALS — BP 110/68 | HR 78 | Temp 98.1°F | Ht 66.0 in | Wt 157.0 lb

## 2019-04-30 DIAGNOSIS — R112 Nausea with vomiting, unspecified: Secondary | ICD-10-CM | POA: Diagnosis not present

## 2019-04-30 DIAGNOSIS — Z1159 Encounter for screening for other viral diseases: Secondary | ICD-10-CM

## 2019-04-30 DIAGNOSIS — K8020 Calculus of gallbladder without cholecystitis without obstruction: Secondary | ICD-10-CM

## 2019-04-30 DIAGNOSIS — R140 Abdominal distension (gaseous): Secondary | ICD-10-CM

## 2019-04-30 DIAGNOSIS — R1011 Right upper quadrant pain: Secondary | ICD-10-CM | POA: Diagnosis not present

## 2019-04-30 DIAGNOSIS — R194 Change in bowel habit: Secondary | ICD-10-CM | POA: Diagnosis not present

## 2019-04-30 DIAGNOSIS — R748 Abnormal levels of other serum enzymes: Secondary | ICD-10-CM

## 2019-04-30 NOTE — Patient Instructions (Addendum)
If you are age 67 or older, your body mass index should be between 23-30. Your Body mass index is 25.34 kg/m. If this is out of the aforementioned range listed, please consider follow up with your Primary Care Provider.  If you are age 37 or younger, your body mass index should be between 19-25. Your Body mass index is 25.34 kg/m. If this is out of the aformentioned range listed, please consider follow up with your Primary Care Provider.   We are giving you IBgard to try: take 2 capsules before meals. If this helps it can be purchased over the counter.  Your provider has requested that you go to the basement level for lab work before leaving today. Press "B" on the elevator. The lab is located at the first door on the left as you exit the elevator.  You have been scheduled for an endoscopy and colonoscopy. Please follow the written instructions given to you at your visit today. Please pick up your prep supplies at the pharmacy within the next 1-3 days. If you use inhalers (even only as needed), please bring them with you on the day of your procedure. Your physician has requested that you go to www.startemmi.com and enter the access code given to you at your visit today. This web site gives a general overview about your procedure. However, you should still follow specific instructions given to you by our office regarding your preparation for the procedure.  Thank you for choosing me and [facility].  Gatha Mayer, M.D., [facility]

## 2019-04-30 NOTE — Progress Notes (Signed)
Jaclyn Lopez 68 y.o. February 16, 1951 [id number]  Assessment & Plan:   Encounter Diagnoses  Name Primary?  . Nausea and vomiting, intractability of vomiting not specified, unspecified vomiting type Yes  . RUQ pain   . Change in bowel habit   . Bloated abdomen   . Calculus of gallbladder without cholecystitis without obstruction   . Abnormal alkaline phosphatase test   . Special screening examination for viral disease    Though improved she has persistent issues.  Plan as follows: IB Gard trial of therapy EGD, clonoscopy Celiac testing  Orders Placed This Encounter  Procedures  . SARS Coronavirus 2 ([facility] ONLY)  . IgA  . Tissue transglutaminase, IgA  . Ambulatory referral to Gastroenterology   The risks and benefits as well as alternatives of endoscopic procedure(s) have been discussed and reviewed. All questions answered. The patient agrees to proceed.  MlDeland Pretty, MD  Subjective:   Chief Complaint:nausea and vomiting, bolating , diarrhea   HPI 68 year old white woman with several complaints, last seen in 2019 because of abnormal LFTs.  Now complaining of intermittent nausea and vomiting though vomiting is better.  Recent abdominal ultrasound demonstrated a large gallstone and fatty liver.  Had been seen by Alonza Bogus, PA-C on October 23.  She had started with several month history of heartburn or reflux symptoms and frequent nausea and sometimes vomiting after fatty meals.  Omeprazole was started 40 mg daily in October and she had a fairly rapid improvement with lack of vomiting.  Chronically soft frequent frequent stools up to 6 6 or 7 a day now down to 5 a day.  Frequent in the morning and her fill out with ability to leave for work in the morning.  See note of 03/09/2019 for further details.  Also noted chronically elevated alk phos with mildly positive ANA.  Goes back for at least a year or more.  Other LFTs normal.  She takes mirtazapine at bedtime  for insomnia issues with good effect.  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 Allergies  Allergen Reactions  . Symmetrel [Amantadine Hcl] Hives  . Tamiflu [Oseltamivir Phosphate] Hives   No outpatient medications have been marked as taking for the 04/30/19 encounter (Office Visit) with Gatha Mayer, MD.   Past Medical History:  Diagnosis Date  . Atypical glandular cells on Pap smear 2003  . 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 CKD (chronic kidney disease)   . Depression   . Herpes progenitalis   . 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 Hyperthyroidism   . Hypothyroidism   . IC (interstitial cystitis)   . Insomnia   . 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 Subsequent LEEP showed CIN-2 with clear margins and negative ECC  . Osteopenia 11/2018   T score -1.5 FRAX 9% / 1% improved at both hips stable at spine  . Thyroid disease    Hyperthyroid   Past Surgical History:  Procedure Laterality Date  . Bladder stretch and Bx  1990  . BREAST LUMPECTOMY  2005   right; cancer  . BREAST LUMPECTOMY  2006   left; pre cancer  . BREAST SURGERY     Reduction  . BUNIONECTOMY  2007, 2011  . CERVICAL CONE BIOPSY  2003  . CESAREAN  SECTION  1983  . DILATION AND CURETTAGE OF UTERUS  2009  . ENDOMETRIAL ABLATION     Novasure  . GANGLION CYST EXCISION Left 1985  . HYSTEROSCOPY  2009  . LEEP  05/2015   CIN-2 with clear margins  . MASTECTOMY, PARTIAL    . MOUTH SURGERY  2012   implants  . TUBAL LIGATION  1984   Social History   Social History Narrative   Divorced   rare EtOH, no tobacco, drugs   Teached preK at [employer]   family history includes Bipolar disorder in her mother; Breast cancer in her maternal grandmother; COPD in her mother; Dementia in her  mother; Lung cancer in her father.   Review of Systems As per HPI  Objective:   Physical Exam '@BP'  110/68   Pulse 78   Temp 98.1 F (36.7 C)   Ht '5\' 6"'  (1.676 m)   Wt 157 lb (71.2 kg)   BMI 25.34 kg/m @  General:  NAD Eyes:   anicteric Lungs:  clear Heart::  S1S2 no rubs, murmurs or gallops Abdomen:  soft and nontender, BS+ Ext:   no edema, cyanosis or clubbing    Data Reviewed:   See HPI

## 2019-05-01 LAB — IGA: IgA: 194 mg/dL (ref 68–378)

## 2019-05-01 LAB — TISSUE TRANSGLUTAMINASE, IGA: (tTG) Ab, IgA: 1 U/mL

## 2019-05-25 ENCOUNTER — Ambulatory Visit (INDEPENDENT_AMBULATORY_CARE_PROVIDER_SITE_OTHER): Payer: Medicare PPO

## 2019-05-25 ENCOUNTER — Other Ambulatory Visit: Payer: Self-pay | Admitting: Internal Medicine

## 2019-05-25 DIAGNOSIS — Z1159 Encounter for screening for other viral diseases: Secondary | ICD-10-CM

## 2019-05-28 LAB — SARS CORONAVIRUS 2 (TAT 6-24 HRS): SARS Coronavirus 2: NEGATIVE

## 2019-05-29 ENCOUNTER — Other Ambulatory Visit: Payer: Self-pay

## 2019-05-29 ENCOUNTER — Ambulatory Visit (AMBULATORY_SURGERY_CENTER): Payer: Medicare PPO | Admitting: Internal Medicine

## 2019-05-29 ENCOUNTER — Encounter: Payer: Self-pay | Admitting: Internal Medicine

## 2019-05-29 VITALS — BP 120/60 | HR 66 | Temp 98.2°F | Resp 16 | Ht 66.0 in | Wt 157.0 lb

## 2019-05-29 DIAGNOSIS — K648 Other hemorrhoids: Secondary | ICD-10-CM | POA: Diagnosis not present

## 2019-05-29 DIAGNOSIS — R194 Change in bowel habit: Secondary | ICD-10-CM | POA: Diagnosis not present

## 2019-05-29 DIAGNOSIS — K449 Diaphragmatic hernia without obstruction or gangrene: Secondary | ICD-10-CM | POA: Diagnosis not present

## 2019-05-29 DIAGNOSIS — D120 Benign neoplasm of cecum: Secondary | ICD-10-CM | POA: Diagnosis not present

## 2019-05-29 DIAGNOSIS — R112 Nausea with vomiting, unspecified: Secondary | ICD-10-CM | POA: Diagnosis not present

## 2019-05-29 DIAGNOSIS — K5900 Constipation, unspecified: Secondary | ICD-10-CM

## 2019-05-29 DIAGNOSIS — D122 Benign neoplasm of ascending colon: Secondary | ICD-10-CM

## 2019-05-29 DIAGNOSIS — R110 Nausea: Secondary | ICD-10-CM

## 2019-05-29 DIAGNOSIS — R1011 Right upper quadrant pain: Secondary | ICD-10-CM | POA: Diagnosis not present

## 2019-05-29 DIAGNOSIS — E039 Hypothyroidism, unspecified: Secondary | ICD-10-CM | POA: Diagnosis not present

## 2019-05-29 DIAGNOSIS — K219 Gastro-esophageal reflux disease without esophagitis: Secondary | ICD-10-CM | POA: Diagnosis not present

## 2019-05-29 MED ORDER — SODIUM CHLORIDE 0.9 % IV SOLN: 500.0000 mL | Freq: Once | INTRAVENOUS | Status: DC

## 2019-05-29 MED ORDER — ONDANSETRON HCL 4 MG/2ML IJ SOLN
4.0000 mg | Freq: Once | INTRAMUSCULAR | Status: AC
  Administered 2019-05-29: 4 mg via INTRAVENOUS

## 2019-05-29 NOTE — Progress Notes (Signed)
Called to room to assist during endoscopic procedure.  Patient ID and intended procedure confirmed with present staff. Received instructions for my participation in the procedure from the performing physician.  

## 2019-05-29 NOTE — Progress Notes (Signed)
Report to PACU, RN, vss, BBS= Clear.  

## 2019-05-29 NOTE — Patient Instructions (Addendum)
On the upper exam I saw a hiatal hernia.  I do not think that is causing major problems if any though sometimes may contribute to nausea.  In the colon I found 4 benign-appearing polyps and removed them - do not think related to your symptoms.  There was also diverticulosis - and doubt that is contributing to problems.  Sometimes there is a microscopic inflammation or colitis causing loose stools and diarrhea and I looked for that with biopsies.  No signs of inflammation to the eye.  I will call with results - probably next week.  I appreciate the opportunity to care for you. Gatha Mayer, MD, FACG   YOU HAD AN ENDOSCOPIC PROCEDURE TODAY AT [facility]:   Refer to the procedure report that was given to you for any specific questions about what was found during the examination.  If the procedure report does not answer your questions, please call your gastroenterologist to clarify.  If you requested that your care partner not be given the details of your procedure findings, then the procedure report has been included in a sealed envelope for you to review at your convenience later.  YOU SHOULD EXPECT: Some feelings of bloating in the abdomen. Passage of more gas than usual.  Walking can help get rid of the air that was put into your GI tract during the procedure and reduce the bloating. If you had a lower endoscopy (such as a colonoscopy or flexible sigmoidoscopy) you may notice spotting of blood in your stool or on the toilet paper. If you underwent a bowel prep for your procedure, you may not have a normal bowel movement for a few days.  Please Note:  You might notice some irritation and congestion in your nose or some drainage.  This is from the oxygen used during your procedure.  There is no need for concern and it should clear up in a day or so.  SYMPTOMS TO REPORT IMMEDIATELY:   Following lower endoscopy (colonoscopy or flexible sigmoidoscopy):  Excessive amounts  of blood in the stool  Significant tenderness or worsening of abdominal pains  Swelling of the abdomen that is new, acute  Fever of 100F or higher   Following upper endoscopy (EGD)  Vomiting of blood or coffee ground material  New chest pain or pain under the shoulder blades  Painful or persistently difficult swallowing  New shortness of breath  Fever of 100F or higher  Black, tarry-looking stools  For urgent or emergent issues, a gastroenterologist can be reached at any hour by calling [phone].   DIET:  We do recommend a small meal at first, but then you may proceed to your regular diet.  Drink plenty of fluids but you should avoid alcoholic beverages for 24 hours.  ACTIVITY:  You should plan to take it easy for the rest of today and you should NOT DRIVE or use heavy machinery until tomorrow (because of the sedation medicines used during the test).    FOLLOW UP: Our staff will call the number listed on your records 48-72 hours following your procedure to check on you and address any questions or concerns that you may have regarding the information given to you following your procedure. If we do not reach you, we will leave a message.  We will attempt to reach you two times.  During this call, we will ask if you have developed any symptoms of COVID 19. If you develop any symptoms (ie: fever, flu-like symptoms,  shortness of breath, cough etc.) before then, please call [phone].  If you test positive for Covid 19 in the 2 weeks post procedure, please call and report this information to [place].    If any biopsies were taken you will be contacted by phone or by letter within the next 1-3 weeks.  Please call us at [phone] if you have not heard about the biopsies in 3 weeks.    SIGNATURES/CONFIDENTIALITY: You and/or your care partner have signed paperwork which will be entered into your electronic medical record.  These signatures attest to the fact that that the information  above on your After Visit Summary has been reviewed and is understood.  Full responsibility of the confidentiality of this discharge information lies with you and/or your care-partner.

## 2019-05-29 NOTE — Progress Notes (Signed)
Zofran 4 mg IV given for post-procedural nausea without vomiting.  Increased IV fluids to receive full liter of NS.

## 2019-05-29 NOTE — Progress Notes (Addendum)
Haslett reported to [facility] Monday, CRNA pt requested zofran for N & V this am and still having nausea will in the admitting area.  ZJosh administer IV zofran will I was checking pt in. maw    09:37  Pt reported her nausea was much better.  maw

## 2019-05-29 NOTE — Op Note (Signed)
[facility] Patient Name: Jaclyn Lopez Procedure Date: 05/29/2019 9:51 AM MRN: [id number] Endoscopist: Gatha Mayer , MD Age: 69 Referring MD:  Date of Birth: 1950/11/06 Gender: Female Account #: [id number] Procedure:                Upper GI endoscopy Indications:              Nausea with vomiting Medicines:                Propofol per Anesthesia, Monitored Anesthesia Care Procedure:                Pre-Anesthesia Assessment:                           - Prior to the procedure, a History and Physical                            was performed, and patient medications and                            allergies were reviewed. The patient's tolerance of                            previous anesthesia was also reviewed. The risks                            and benefits of the procedure and the sedation                            options and risks were discussed with the patient.                            All questions were answered, and informed consent                            was obtained. Prior Anticoagulants: The patient has                            taken no previous anticoagulant or antiplatelet                            agents. ASA Grade Assessment: II - A patient with                            mild systemic disease. After reviewing the risks                            and benefits, the patient was deemed in                            satisfactory condition to undergo the procedure.                           After obtaining informed consent, the endoscope was  passed under direct vision. Throughout the                            procedure, the patient's blood pressure, pulse, and                            oxygen saturations were monitored continuously. The                            Endoscope was introduced through the mouth, and                            advanced to the second part of duodenum. The upper                            GI  endoscopy was accomplished without difficulty.                            The patient tolerated the procedure well. Scope In: Scope Out: Findings:                 A 5 cm hiatal hernia was present.                           The exam was otherwise without abnormality.                           The cardia and gastric fundus were normal on                            retroflexion. Complications:            No immediate complications. Estimated Blood Loss:     Estimated blood loss: none. Impression:               - 5 cm hiatal hernia.                           - The examination was otherwise normal.                           - No specimens collected. Recommendation:           - Patient has a contact number available for                            emergencies. The signs and symptoms of potential                            delayed complications were discussed with the                            patient. Return to normal activities tomorrow.                            Written discharge instructions were provided to the  patient.                           - Resume previous diet.                           - Continue present medications.                           - See the other procedure note for documentation of                            additional recommendations. Gatha Mayer, MD 05/29/2019 10:46:16 AM This report has been signed electronically.

## 2019-05-29 NOTE — Op Note (Signed)
[place] Patient Name: Jaclyn Lopez Procedure Date: 05/29/2019 9:51 AM MRN: [id number] Endoscopist: Gatha Mayer , MD Age: 69 Referring MD:  Date of Birth: [birth year] Gender: Female Account #: [id number] Procedure:                Colonoscopy Indications:              Clinically significant diarrhea of unexplained                            origin, Change in bowel habits Medicines:                Propofol per Anesthesia, Monitored Anesthesia Care Procedure:                Pre-Anesthesia Assessment:                           - Prior to the procedure, a History and Physical                            was performed, and patient medications and                            allergies were reviewed. The patient's tolerance of                            previous anesthesia was also reviewed. The risks                            and benefits of the procedure and the sedation                            options and risks were discussed with the patient.                            All questions were answered, and informed consent                            was obtained. Prior Anticoagulants: The patient has                            taken no previous anticoagulant or antiplatelet                            agents. ASA Grade Assessment: II - A patient with                            mild systemic disease. After reviewing the risks                            and benefits, the patient was deemed in                            satisfactory condition to undergo the procedure.  After obtaining informed consent, the colonoscope                            was passed under direct vision. Throughout the                            procedure, the patient's blood pressure, pulse, and                            oxygen saturations were monitored continuously. The                            Colonoscope was introduced through the anus and   advanced to the the terminal ileum, with                            identification of the appendiceal orifice and IC                            valve. The colonoscopy was somewhat difficult due                            to significant looping. Successful completion of                            the procedure was aided by applying abdominal                            pressure. The patient tolerated the procedure well.                            The quality of the bowel preparation was excellent.                            The bowel preparation used was Miralax via split                            dose instruction. The terminal ileum, ileocecal                            valve, appendiceal orifice, and rectum were                            photographed. Scope In: 10:06:43 AM Scope Out: 10:28:58 AM Scope Withdrawal Time: 0 hours 16 minutes 52 seconds  Total Procedure Duration: 0 hours 22 minutes 15 seconds  Findings:                 The perianal and digital rectal examinations were                            normal.                           Four sessile polyps were found in the ascending  colon and cecum. The polyps were 2 to 7 mm in size.                            These polyps were removed with a cold snare.                            Resection and retrieval were complete. Verification                            of patient identification for the specimen was                            done. Estimated blood loss was minimal.                           Multiple diverticula were found in the sigmoid                            colon.                           The terminal ileum appeared normal.                           Internal hemorrhoids were found.                           The exam was otherwise without abnormality on                            direct and retroflexion views. Complications:            No immediate complications. Estimated Blood Loss:      Estimated blood loss was minimal. Impression:               - Four 2 to 7 mm polyps in the ascending colon and                            in the cecum, removed with a cold snare. Resected                            and retrieved.                           - Diverticulosis in the sigmoid colon.                           - The examined portion of the ileum was normal.                           - Internal hemorrhoids.                           - The examination was otherwise normal on direct  and retroflexion views. Recommendation:           - Patient has a contact number available for                            emergencies. The signs and symptoms of potential                            delayed complications were discussed with the                            patient. Return to normal activities tomorrow.                            Written discharge instructions were provided to the                            patient.                           - Resume previous diet.                           - Continue present medications.                           - Await pathology results. ? Microscopic colitis, ?                            IBS - seems likely (also has IC)                           - Repeat colonoscopy is recommended. The                            colonoscopy date will be determined after pathology                            results from today's exam become available for                            review. Gatha Mayer, MD 05/29/2019 10:52:14 AM This report has been signed electronically.

## 2019-05-31 ENCOUNTER — Telehealth: Payer: Self-pay

## 2019-05-31 NOTE — Telephone Encounter (Signed)
Second follow up phone call attempt, no answer, LM 

## 2019-05-31 NOTE — Telephone Encounter (Signed)
Left message on follow up call. 

## 2019-06-06 ENCOUNTER — Other Ambulatory Visit: Payer: Self-pay | Admitting: Internal Medicine

## 2019-06-06 MED ORDER — ONDANSETRON HCL 4 MG PO TABS: 4.0000 mg | ORAL_TABLET | Freq: Three times a day (TID) | ORAL | 1 refills | Status: DC

## 2019-06-06 NOTE — Progress Notes (Signed)
Call from office please Tell her 4 small benign but precancerous polyps needs 5 year colon recall No colitis (no inflammation) Thinking her problem is IBS - Irritable Bwel  Please ask her how she is doing re: nausea and vomiting and diarrhea and I will come up with a treatment plan soon

## 2019-06-13 ENCOUNTER — Ambulatory Visit: Payer: Medicare PPO

## 2019-06-22 ENCOUNTER — Ambulatory Visit: Payer: Medicare PPO | Attending: Internal Medicine

## 2019-06-22 DIAGNOSIS — Z23 Encounter for immunization: Secondary | ICD-10-CM | POA: Insufficient documentation

## 2019-06-22 NOTE — Progress Notes (Signed)
   Covid-19 Vaccination Clinic  Name:  Jaclyn Lopez    MRN: [id number] DOB: [birth year]  06/22/2019  [name] was observed post Covid-19 immunization for 15 minutes without incidence. She was provided with Vaccine Information Sheet and instruction to access the V-Safe system.   [name] was instructed to call 911 with any severe reactions post vaccine: Marland Kitchen Difficulty breathing  . Swelling of your face and throat  . A fast heartbeat  . A bad rash all over your body  . Dizziness and weakness    Immunizations Administered    Name Date Dose VIS Date Route   Pfizer COVID-19 Vaccine 06/22/2019  9:56 AM 0.3 mL 04/27/2019 Intramuscular   Manufacturer: [place]   Lot: [id number]   [place]: [id number]

## 2019-07-03 ENCOUNTER — Ambulatory Visit: Payer: Medicare PPO | Admitting: Internal Medicine

## 2019-07-03 ENCOUNTER — Encounter: Payer: Self-pay | Admitting: Internal Medicine

## 2019-07-03 VITALS — BP 122/77 | HR 69 | Temp 97.6°F | Ht 66.0 in | Wt 155.8 lb

## 2019-07-03 DIAGNOSIS — K449 Diaphragmatic hernia without obstruction or gangrene: Secondary | ICD-10-CM

## 2019-07-03 DIAGNOSIS — K219 Gastro-esophageal reflux disease without esophagitis: Secondary | ICD-10-CM | POA: Diagnosis not present

## 2019-07-03 DIAGNOSIS — R195 Other fecal abnormalities: Secondary | ICD-10-CM | POA: Diagnosis not present

## 2019-07-03 DIAGNOSIS — Z86010 Personal history of colonic polyps: Secondary | ICD-10-CM

## 2019-07-03 MED ORDER — PANCRELIPASE (LIP-PROT-AMYL) 36000-114000 UNITS PO CPEP: ORAL_CAPSULE | ORAL | 0 refills | Status: DC

## 2019-07-03 NOTE — Progress Notes (Signed)
Jaclyn Lopez 69 y.o. 1950-08-04 [id number]  Assessment & Plan:   Encounter Diagnoses  Name Primary?  . Loose stools Yes  . Hiatal hernia   . Gastroesophageal reflux disease without esophagitis   . Hx of adenomatous colonic polyps    She has found that buttermilk product sugar and rich foods exacerbate her problems.  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  A trial of Creon 36K samples with meals.  If this helps we may continue that.  We had a good discussion about IBS and what it is.  She does not have microscopic colitis and I have screened her for celiac disease as well and was negative.  She will report back regarding the trial of Creon.  Further plans pending that.  Could consider cholestyramine or colestipol.   I reviewed what a hiatal hernia is reflux and her adenomatous colonic polyps and the rationale for 5-year colonoscopy recall.  I appreciate the opportunity to care for this patient. CC: Deland Pretty, MD  Subjective:   Chief Complaint: Follow-up of nausea and diarrhea or loose stools  HPI Jaclyn Lopez is here for follow-up of nausea, vomiting and loose stools.  She underwent an EGD which showed a 5 cm hiatal hernia, colonoscopy with negative random biopsies for microscopic colitis and four subcentimeter adenomas, in January.  Normal terminal ileum she did have diverticulosis.  I tried her on ondansetron before meals but it gave her a dry mouth.  Her nausea problems are gone.  She has noticed that if she reduces sugar butter, milk products and very rich foods, her bowel movements are less of a problem.  She still has frequent morning bowel movements which make it a bit difficult to get out the door for work and then has some bowel movements the rest of the day but it is less than before.  She is improved would like better quality of life with respect to bowel habits.  Omeprazole 20 mg daily is leading to excellent control of heartburn.  Wt  Readings from Last 3 Encounters:  07/03/19 155 lb 12.8 oz (70.7 kg)  05/29/19 157 lb (71.2 kg)  04/30/19 157 lb (71.2 kg)    Allergies  Allergen Reactions  . Symmetrel [Amantadine Hcl] Hives  . Tamiflu [Oseltamivir Phosphate] Hives   Current Meds  Medication Sig  . acyclovir (ZOVIRAX) 200 MG capsule Take 200 mg by mouth once a week.  Marland Kitchen BIOTIN PO Take by mouth daily.   Marland Kitchen buPROPion (WELLBUTRIN SR) 150 MG 12 hr tablet Take 150 mg by mouth daily.  . Cholecalciferol (VITAMIN D PO) Take 1,000 Units by mouth.    Marland Kitchen FLUoxetine (PROZAC) 20 MG capsule Take 20 mg by mouth daily.   Marland Kitchen levothyroxine (SYNTHROID, LEVOTHROID) 50 MCG tablet Take 25 mcg by mouth daily.   . mirtazapine (REMERON) 15 MG tablet Take 15 mg by mouth at bedtime. Take 3 mg  . omeprazole (PRILOSEC) 20 MG capsule Take 20 mg by mouth daily.  . ondansetron (ZOFRAN) 4 MG tablet Take 1 tablet (4 mg total) by mouth 3 (three) times daily before meals.   Past Medical History:  Diagnosis Date  . Atypical glandular cells on Pap smear 2003  . 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 CKD (chronic kidney disease)   . Depression   . GERD (gastroesophageal reflux disease)   . Herpes progenitalis   . HPV in female 2014/2015/2016   Normal  cytology but positive HPV x3 normal colposcopy/negative the ECC  . Hyperthyroidism   . Hypothyroidism   . IC (interstitial cystitis)   . Insomnia   . 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 Subsequent LEEP showed CIN-2 with clear margins and negative ECC  . Osteopenia 11/2018   T score -1.5 FRAX 9% / 1% improved at both hips stable at spine  . Thyroid disease    Hyperthyroid   Past Surgical History:  Procedure Laterality Date  . Bladder stretch and Bx  1990  . BREAST LUMPECTOMY  2005   right; cancer  . BREAST LUMPECTOMY  2006   left; pre cancer  . BREAST SURGERY     Reduction  . BUNIONECTOMY   2007, 2011  . CERVICAL CONE BIOPSY  2003  . CESAREAN SECTION  1983  . DILATION AND CURETTAGE OF UTERUS  2009  . ENDOMETRIAL ABLATION     Novasure  . GANGLION CYST EXCISION Left 1985  . HYSTEROSCOPY  2009  . LEEP  05/2015   CIN-2 with clear margins  . MASTECTOMY, PARTIAL    . MOUTH SURGERY  2012   implants  . TUBAL LIGATION  1984   Social History   Social History Narrative   Divorced   rare EtOH, no tobacco, drugs   Teached preK at [employer]   family history includes Bipolar disorder in her mother; Breast cancer in her maternal grandmother; COPD in her mother; Cancer - Lung in her father; Dementia in her mother; Lung cancer in her father.   Review of Systems As above  Objective:   Physical Exam BP 122/77   Lopez 70   Temp 97.6 F (36.4 C)   Ht 5\' 6"  (1.676 m)   Wt 155 lb 12.8 oz (70.7 kg)   SpO2 99%   BMI 25.15 kg/m  NAD

## 2019-07-03 NOTE — Patient Instructions (Signed)
We are giving you samples of Creon today. Take one capsule with meals. If there is not a lot of grease or fat in the meal you don't have to take the capsule.   I appreciate the opportunity to care for you. Silvano Rusk, MD, [facility]

## 2019-07-04 ENCOUNTER — Ambulatory Visit: Payer: Medicare PPO

## 2019-07-17 ENCOUNTER — Ambulatory Visit: Payer: Medicare PPO | Attending: Internal Medicine

## 2019-07-17 DIAGNOSIS — Z23 Encounter for immunization: Secondary | ICD-10-CM

## 2019-07-17 NOTE — Progress Notes (Signed)
   Covid-19 Vaccination Clinic  Name:  Jaclyn Lopez    MRN: [id number] DOB: [birth year]  07/17/2019  [name] was observed post Covid-19 immunization for 15 minutes without incident. She was provided with Vaccine Information Sheet and instruction to access the V-Safe system.   [name] was instructed to call 911 with any severe reactions post vaccine: Marland Kitchen Difficulty breathing  . Swelling of face and throat  . A fast heartbeat  . A bad rash all over body  . Dizziness and weakness   Immunizations Administered    Name Date Dose VIS Date Route   Pfizer COVID-19 Vaccine 07/17/2019  1:47 PM 0.3 mL 04/27/2019 Intramuscular   Manufacturer: [place]   Lot: [id number]   [place]: [id number]

## 2019-09-20 DIAGNOSIS — H16101 Unspecified superficial keratitis, right eye: Secondary | ICD-10-CM | POA: Diagnosis not present

## 2019-09-27 DIAGNOSIS — H16141 Punctate keratitis, right eye: Secondary | ICD-10-CM | POA: Diagnosis not present

## 2019-09-27 DIAGNOSIS — H01021 Squamous blepharitis right upper eyelid: Secondary | ICD-10-CM | POA: Diagnosis not present

## 2019-09-27 DIAGNOSIS — H02051 Trichiasis without entropian right upper eyelid: Secondary | ICD-10-CM | POA: Diagnosis not present

## 2019-10-29 DIAGNOSIS — N183 Chronic kidney disease, stage 3 unspecified: Secondary | ICD-10-CM | POA: Diagnosis not present

## 2019-10-29 DIAGNOSIS — M7502 Adhesive capsulitis of left shoulder: Secondary | ICD-10-CM | POA: Diagnosis not present

## 2019-10-29 DIAGNOSIS — R112 Nausea with vomiting, unspecified: Secondary | ICD-10-CM | POA: Diagnosis not present

## 2019-10-29 DIAGNOSIS — E039 Hypothyroidism, unspecified: Secondary | ICD-10-CM | POA: Diagnosis not present

## 2019-10-29 DIAGNOSIS — R748 Abnormal levels of other serum enzymes: Secondary | ICD-10-CM | POA: Diagnosis not present

## 2019-11-15 DIAGNOSIS — I639 Cerebral infarction, unspecified: Secondary | ICD-10-CM

## 2019-11-15 HISTORY — DX: Cerebral infarction, unspecified: I63.9

## 2019-11-21 ENCOUNTER — Other Ambulatory Visit: Payer: Self-pay

## 2019-11-21 ENCOUNTER — Encounter: Payer: Self-pay | Admitting: Nurse Practitioner

## 2019-11-21 ENCOUNTER — Ambulatory Visit (INDEPENDENT_AMBULATORY_CARE_PROVIDER_SITE_OTHER): Payer: Medicare PPO | Admitting: Nurse Practitioner

## 2019-11-21 VITALS — BP 118/76 | Ht 66.0 in | Wt 144.0 lb

## 2019-11-21 DIAGNOSIS — Z01419 Encounter for gynecological examination (general) (routine) without abnormal findings: Secondary | ICD-10-CM

## 2019-11-21 DIAGNOSIS — B009 Herpesviral infection, unspecified: Secondary | ICD-10-CM | POA: Diagnosis not present

## 2019-11-21 DIAGNOSIS — M8589 Other specified disorders of bone density and structure, multiple sites: Secondary | ICD-10-CM | POA: Diagnosis not present

## 2019-11-21 DIAGNOSIS — Z853 Personal history of malignant neoplasm of breast: Secondary | ICD-10-CM | POA: Diagnosis not present

## 2019-11-21 DIAGNOSIS — Z8742 Personal history of other diseases of the female genital tract: Secondary | ICD-10-CM | POA: Diagnosis not present

## 2019-11-21 DIAGNOSIS — D0591 Unspecified type of carcinoma in situ of right breast: Secondary | ICD-10-CM

## 2019-11-21 DIAGNOSIS — Z124 Encounter for screening for malignant neoplasm of cervix: Secondary | ICD-10-CM | POA: Diagnosis not present

## 2019-11-21 DIAGNOSIS — Z9289 Personal history of other medical treatment: Secondary | ICD-10-CM | POA: Diagnosis not present

## 2019-11-21 MED ORDER — ACYCLOVIR 200 MG PO CAPS: 200.0000 mg | ORAL_CAPSULE | ORAL | 1 refills | Status: DC

## 2019-11-21 NOTE — Progress Notes (Signed)
   Jaclyn Lopez 1951-04-04 [id number]   History:  69 y.o. G2 P2 presents for breast and pelvic exam without GYN complaints. Postmenopausal-no HRT, no bleeding. 2005 right breast cancer, DCIS with lumpectomy. Osteopenia, taking Vitamin D supplement daily. Abnormal pap history-2014 negative cytology positive HPV with repeat in 2015 with negative cytology and positive HPV, colposcopy normal. 2016 normal cytology positive HPV negative 16/18/45, colposcopy showed LGSIL. LEEP 2017 HGSIL, gladular involvement, clear margins. Subsequent paps normal. Takes Acyclovir weekly for HSV. Followed by urology for interstitial cystitis.   Gynecologic History No LMP recorded. Patient is postmenopausal.   Contraception: post menopausal status Last Pap: 11/02/2017. Results were: normal Last mammogram: 01/04/2019. Results were: normal Last colonoscopy: 05/2019. Results were: normal Last Dexa: 12/05/2018. Results were: T-score -1.5 FRAX 9.0% / 1.0%  Past medical history, past surgical history, family history and social history were all reviewed and documented in the EPIC chart.  ROS:  A ROS was performed and pertinent positives and negatives are included.  Exam:  Vitals:   11/21/19 1053  Weight: 144 lb (65.3 kg)  Height: 5\' 6"  (1.676 m)   Body mass index is 23.24 kg/m.  General appearance:  Normal Thyroid:  Symmetrical, normal in size, without palpable masses or nodularity. Respiratory  Auscultation:  Clear without wheezing or rhonchi Cardiovascular  Auscultation:  Regular rate, without rubs, murmurs or gallops  Edema/varicosities:  Not grossly evident Abdominal  Soft,nontender, without masses, guarding or rebound.  Liver/spleen:  No organomegaly noted  Hernia:  None appreciated  Skin  Inspection:  Grossly normal   Breasts: Examined lying and sitting.   Right: Without masses, retractions, discharge or axillary adenopathy.   Left: Without masses, retractions, discharge or axillary  adenopathy. Gentitourinary   Inguinal/mons:  Normal without inguinal adenopathy  External genitalia:  Normal  BUS/Urethra/Skene's glands:  Normal  Vagina:  Normal  Cervix:  Normal  Uterus:  Normal in size, shape and contour.  Midline and mobile  Adnexa/parametria:     Rt: Without masses or tenderness.   Lt: Without masses or tenderness.  Anus and perineum: Normal  Digital rectal exam: Normal sphincter tone without palpated masses or tenderness  Assessment/Plan:  69 y.o. G2 P2 for annual exam.   Well female exam with routine gynecological exam - Education provided on SBEs, importance of preventative screenings, current guidelines, high calcium diet, regular exercise, and multivitamin daily. Labs done elsewhere. Pap with HR HPV today.   HSV-2 (herpes simplex virus 2) infection - Plan: acyclovir (ZOVIRAX) 200 MG capsule weekly. Has not had outbreaks in years. 1 year supply prescribed.   Osteopenia of multiple sites - taking daily vitamin D supplement. Regular exercise, member of a gym.   Carcinoma in situ of right breast, unspecified type - normal breast exam today. Up to date on mammogram.  Follow up in 1 year for annual      [place], 11:03 AM 11/21/2019

## 2019-11-21 NOTE — Patient Instructions (Signed)
Health Maintenance After Age 68 After age 68, you are at a higher risk for certain long-term diseases and infections as well as injuries from falls. Falls are a major cause of broken bones and head injuries in people who are older than age 68. Getting regular preventive care can help to keep you healthy and well. Preventive care includes getting regular testing and making lifestyle changes as recommended by your health care provider. Talk with your health care provider about:  Which screenings and tests you should have. A screening is a test that checks for a disease when you have no symptoms.  A diet and exercise plan that is right for you. What should I know about screenings and tests to prevent falls? Screening and testing are the best ways to find a health problem early. Early diagnosis and treatment give you the best chance of managing medical conditions that are common after age 68. Certain conditions and lifestyle choices may make you more likely to have a fall. Your health care provider may recommend:  Regular vision checks. Poor vision and conditions such as cataracts can make you more likely to have a fall. If you wear glasses, make sure to get your prescription updated if your vision changes.  Medicine review. Work with your health care provider to regularly review all of the medicines you are taking, including over-the-counter medicines. Ask your health care provider about any side effects that may make you more likely to have a fall. Tell your health care provider if any medicines that you take make you feel dizzy or sleepy.  Osteoporosis screening. Osteoporosis is a condition that causes the bones to get weaker. This can make the bones weak and cause them to break more easily.  Blood pressure screening. Blood pressure changes and medicines to control blood pressure can make you feel dizzy.  Strength and balance checks. Your health care provider may recommend certain tests to check your  strength and balance while standing, walking, or changing positions.  Foot health exam. Foot pain and numbness, as well as not wearing proper footwear, can make you more likely to have a fall.  Depression screening. You may be more likely to have a fall if you have a fear of falling, feel emotionally low, or feel unable to do activities that you used to do.  Alcohol use screening. Using too much alcohol can affect your balance and may make you more likely to have a fall. What actions can I take to lower my risk of falls? General instructions  Talk with your health care provider about your risks for falling. Tell your health care provider if: ? You fall. Be sure to tell your health care provider about all falls, even ones that seem minor. ? You feel dizzy, sleepy, or off-balance.  Take over-the-counter and prescription medicines only as told by your health care provider. These include any supplements.  Eat a healthy diet and maintain a healthy weight. A healthy diet includes low-fat dairy products, low-fat (lean) meats, and fiber from whole grains, beans, and lots of fruits and vegetables. Home safety  Remove any tripping hazards, such as rugs, cords, and clutter.  Install safety equipment such as grab bars in bathrooms and safety rails on stairs.  Keep rooms and walkways well-lit. Activity   Follow a regular exercise program to stay fit. This will help you maintain your balance. Ask your health care provider what types of exercise are appropriate for you.  If you need a cane or   walker, use it as recommended by your health care provider.  Wear supportive shoes that have nonskid soles. Lifestyle  Do not drink alcohol if your health care provider tells you not to drink.  If you drink alcohol, limit how much you have: ? 0-1 drink a day for women. ? 0-2 drinks a day for men.  Be aware of how much alcohol is in your drink. In the [place] one drink equals one typical bottle of beer (12  oz), one-half glass of wine (5 oz), or one shot of hard liquor (1 oz).  Do not use any products that contain nicotine or tobacco, such as cigarettes and e-cigarettes. If you need help quitting, ask your health care provider. Summary  Having a healthy lifestyle and getting preventive care can help to protect your health and wellness after age 68.  Screening and testing are the best way to find a health problem early and help you avoid having a fall. Early diagnosis and treatment give you the best chance for managing medical conditions that are more common for people who are older than age 68.  Falls are a major cause of broken bones and head injuries in people who are older than age 68. Take precautions to prevent a fall at home.  Work with your health care provider to learn what changes you can make to improve your health and wellness and to prevent falls. This information is not intended to replace advice given to you by your health care provider. Make sure you discuss any questions you have with your health care provider. Document Revised: 08/24/2018 Document Reviewed: 03/16/2017 Elsevier Patient Education  2020 [employer]vier Inc.  

## 2019-11-21 NOTE — Addendum Note (Signed)
Addended by: Nelva Nay on: 11/21/2019 11:32 AM   Modules accepted: Orders

## 2019-11-26 LAB — PAP IG W/ RFLX HPV ASCU

## 2019-11-28 DIAGNOSIS — G4700 Insomnia, unspecified: Secondary | ICD-10-CM | POA: Diagnosis not present

## 2019-11-28 DIAGNOSIS — E039 Hypothyroidism, unspecified: Secondary | ICD-10-CM | POA: Diagnosis not present

## 2019-11-28 DIAGNOSIS — Z0000 Encounter for general adult medical examination without abnormal findings: Secondary | ICD-10-CM | POA: Diagnosis not present

## 2019-11-28 DIAGNOSIS — R748 Abnormal levels of other serum enzymes: Secondary | ICD-10-CM | POA: Diagnosis not present

## 2019-11-29 ENCOUNTER — Other Ambulatory Visit: Payer: Self-pay

## 2019-11-29 ENCOUNTER — Inpatient Hospital Stay (HOSPITAL_COMMUNITY): Payer: Medicare PPO

## 2019-11-29 ENCOUNTER — Emergency Department (HOSPITAL_COMMUNITY): Payer: Medicare PPO

## 2019-11-29 ENCOUNTER — Encounter (HOSPITAL_COMMUNITY): Payer: Self-pay | Admitting: Emergency Medicine

## 2019-11-29 ENCOUNTER — Observation Stay (HOSPITAL_COMMUNITY)
Admission: EM | Admit: 2019-11-29 | Discharge: 2019-11-30 | Disposition: A | Discharge status: Home / Self Care | Payer: Medicare PPO | Attending: Internal Medicine | Admitting: Internal Medicine

## 2019-11-29 DIAGNOSIS — Z853 Personal history of malignant neoplasm of breast: Secondary | ICD-10-CM | POA: Diagnosis not present

## 2019-11-29 DIAGNOSIS — Z79890 Hormone replacement therapy (postmenopausal): Secondary | ICD-10-CM | POA: Diagnosis not present

## 2019-11-29 DIAGNOSIS — N183 Chronic kidney disease, stage 3 unspecified: Secondary | ICD-10-CM | POA: Diagnosis present

## 2019-11-29 DIAGNOSIS — N1832 Chronic kidney disease, stage 3b: Secondary | ICD-10-CM | POA: Diagnosis not present

## 2019-11-29 DIAGNOSIS — I639 Cerebral infarction, unspecified: Secondary | ICD-10-CM | POA: Diagnosis not present

## 2019-11-29 DIAGNOSIS — Z923 Personal history of irradiation: Secondary | ICD-10-CM | POA: Diagnosis not present

## 2019-11-29 DIAGNOSIS — R4781 Slurred speech: Secondary | ICD-10-CM | POA: Diagnosis not present

## 2019-11-29 DIAGNOSIS — F32A Depression, unspecified: Secondary | ICD-10-CM | POA: Diagnosis present

## 2019-11-29 DIAGNOSIS — F329 Major depressive disorder, single episode, unspecified: Secondary | ICD-10-CM | POA: Diagnosis not present

## 2019-11-29 DIAGNOSIS — Z03818 Encounter for observation for suspected exposure to other biological agents ruled out: Secondary | ICD-10-CM | POA: Diagnosis not present

## 2019-11-29 DIAGNOSIS — R29810 Facial weakness: Secondary | ICD-10-CM

## 2019-11-29 DIAGNOSIS — E039 Hypothyroidism, unspecified: Secondary | ICD-10-CM | POA: Diagnosis not present

## 2019-11-29 DIAGNOSIS — Z87891 Personal history of nicotine dependence: Secondary | ICD-10-CM | POA: Insufficient documentation

## 2019-11-29 DIAGNOSIS — E785 Hyperlipidemia, unspecified: Secondary | ICD-10-CM | POA: Insufficient documentation

## 2019-11-29 DIAGNOSIS — Z20822 Contact with and (suspected) exposure to covid-19: Secondary | ICD-10-CM | POA: Diagnosis not present

## 2019-11-29 DIAGNOSIS — Z79899 Other long term (current) drug therapy: Secondary | ICD-10-CM | POA: Diagnosis not present

## 2019-11-29 LAB — URINALYSIS, ROUTINE W REFLEX MICROSCOPIC
Bilirubin Urine: NEGATIVE
Glucose, UA: NEGATIVE mg/dL
Hgb urine dipstick: NEGATIVE
Ketones, ur: NEGATIVE mg/dL
Leukocytes,Ua: NEGATIVE
Nitrite: NEGATIVE
Protein, ur: NEGATIVE mg/dL
Specific Gravity, Urine: 1.004 — ABNORMAL LOW (ref 1.005–1.030)
pH: 7 (ref 5.0–8.0)

## 2019-11-29 LAB — CBC
HCT: 43.3 % (ref 36.0–46.0)
Hemoglobin: 13.9 g/dL (ref 12.0–15.0)
MCH: 28.5 pg (ref 26.0–34.0)
MCHC: 32.1 g/dL (ref 30.0–36.0)
MCV: 88.9 fL (ref 80.0–100.0)
Platelets: 232 10*3/uL (ref 150–400)
RBC: 4.87 MIL/uL (ref 3.87–5.11)
RDW: 13.2 % (ref 11.5–15.5)
WBC: 7.3 10*3/uL (ref 4.0–10.5)
nRBC: 0 % (ref 0.0–0.2)

## 2019-11-29 LAB — COMPREHENSIVE METABOLIC PANEL
ALT: 18 U/L (ref 0–44)
AST: 20 U/L (ref 15–41)
Albumin: 4 g/dL (ref 3.5–5.0)
Alkaline Phosphatase: 112 U/L (ref 38–126)
Anion gap: 9 (ref 5–15)
BUN: 14 mg/dL (ref 8–23)
CO2: 26 mmol/L (ref 22–32)
Calcium: 9.3 mg/dL (ref 8.9–10.3)
Chloride: 105 mmol/L (ref 98–111)
Creatinine, Ser: 1.26 mg/dL — ABNORMAL HIGH (ref 0.44–1.00)
GFR calc Af Amer: 51 mL/min — ABNORMAL LOW (ref >60)
GFR calc non Af Amer: 44 mL/min — ABNORMAL LOW (ref >60)
Glucose, Bld: 82 mg/dL (ref 70–99)
Potassium: 4.5 mmol/L (ref 3.5–5.1)
Sodium: 140 mmol/L (ref 135–145)
Total Bilirubin: 0.8 mg/dL (ref 0.3–1.2)
Total Protein: 6.7 g/dL (ref 6.5–8.1)

## 2019-11-29 LAB — RAPID URINE DRUG SCREEN, HOSP PERFORMED
Amphetamines: NOT DETECTED
Barbiturates: NOT DETECTED
Benzodiazepines: NOT DETECTED
Cocaine: NOT DETECTED
Opiates: NOT DETECTED
Tetrahydrocannabinol: NOT DETECTED

## 2019-11-29 LAB — DIFFERENTIAL
Abs Immature Granulocytes: 0.03 10*3/uL (ref 0.00–0.07)
Basophils Absolute: 0.1 10*3/uL (ref 0.0–0.1)
Basophils Relative: 1 %
Eosinophils Absolute: 0.1 10*3/uL (ref 0.0–0.5)
Eosinophils Relative: 1 %
Immature Granulocytes: 0 %
Lymphocytes Relative: 27 %
Lymphs Abs: 2 10*3/uL (ref 0.7–4.0)
Monocytes Absolute: 0.7 10*3/uL (ref 0.1–1.0)
Monocytes Relative: 9 %
Neutro Abs: 4.4 10*3/uL (ref 1.7–7.7)
Neutrophils Relative %: 62 %

## 2019-11-29 LAB — LIPID PANEL
Cholesterol: 249 mg/dL — ABNORMAL HIGH (ref 0–200)
HDL: 78 mg/dL (ref >40)
LDL Cholesterol: 155 mg/dL — ABNORMAL HIGH (ref 0–99)
Total CHOL/HDL Ratio: 3.2 RATIO
Triglycerides: 81 mg/dL (ref <150)
VLDL: 16 mg/dL (ref 0–40)

## 2019-11-29 LAB — HEMOGLOBIN A1C
Hgb A1c MFr Bld: 5.3 % (ref 4.8–5.6)
Mean Plasma Glucose: 105.41 mg/dL

## 2019-11-29 LAB — PROTIME-INR
INR: 1 (ref 0.8–1.2)
Prothrombin Time: 12.9 seconds (ref 11.4–15.2)

## 2019-11-29 LAB — SARS CORONAVIRUS 2 BY RT PCR (HOSPITAL ORDER, PERFORMED IN ~~LOC~~ HOSPITAL LAB): SARS Coronavirus 2: NEGATIVE

## 2019-11-29 LAB — ETHANOL: Alcohol, Ethyl (B): <10 mg/dL (ref <10)

## 2019-11-29 LAB — HIV ANTIBODY (ROUTINE TESTING W REFLEX): HIV Screen 4th Generation wRfx: NONREACTIVE

## 2019-11-29 LAB — APTT: aPTT: 26 seconds (ref 24–36)

## 2019-11-29 MED ORDER — ENOXAPARIN SODIUM 40 MG/0.4ML ~~LOC~~ SOLN
40.0000 mg | SUBCUTANEOUS | Status: DC
  Administered 2019-11-30: 40 mg via SUBCUTANEOUS
  Filled 2019-11-29: qty 0.4

## 2019-11-29 MED ORDER — ACETAMINOPHEN 325 MG PO TABS
650.0000 mg | ORAL_TABLET | ORAL | Status: DC | PRN
  Administered 2019-11-29: 650 mg via ORAL
  Filled 2019-11-29: qty 2

## 2019-11-29 MED ORDER — LEVOTHYROXINE SODIUM 50 MCG PO TABS
50.0000 ug | ORAL_TABLET | Freq: Every day | ORAL | Status: DC
  Administered 2019-11-30: 50 ug via ORAL
  Filled 2019-11-29: qty 1

## 2019-11-29 MED ORDER — ASPIRIN 325 MG PO TABS: 325.0000 mg | ORAL_TABLET | Freq: Every day | ORAL | Status: DC

## 2019-11-29 MED ORDER — ASPIRIN 300 MG RE SUPP: 300.0000 mg | Freq: Every day | RECTAL | Status: DC

## 2019-11-29 MED ORDER — STROKE: EARLY STAGES OF RECOVERY BOOK
Freq: Once | Status: AC
  Filled 2019-11-29 (×2): qty 1

## 2019-11-29 MED ORDER — ZOLPIDEM TARTRATE 5 MG PO TABS
5.0000 mg | ORAL_TABLET | Freq: Every evening | ORAL | Status: DC | PRN
  Administered 2019-11-30: 5 mg via ORAL
  Filled 2019-11-29: qty 1

## 2019-11-29 MED ORDER — MIRTAZAPINE 7.5 MG PO TABS
3.7500 mg | ORAL_TABLET | Freq: Every day | ORAL | Status: DC
  Administered 2019-11-29: 3.75 mg via ORAL
  Filled 2019-11-29 (×3): qty 1

## 2019-11-29 MED ORDER — BUPROPION HCL ER (XL) 150 MG PO TB24
150.0000 mg | ORAL_TABLET | Freq: Every morning | ORAL | Status: DC
  Administered 2019-11-30: 150 mg via ORAL
  Filled 2019-11-29: qty 1

## 2019-11-29 MED ORDER — ASPIRIN 81 MG PO CHEW
324.0000 mg | CHEWABLE_TABLET | Freq: Once | ORAL | Status: AC
  Administered 2019-11-29: 324 mg via ORAL
  Filled 2019-11-29: qty 4

## 2019-11-29 MED ORDER — ACETAMINOPHEN 650 MG RE SUPP: 650.0000 mg | RECTAL | Status: DC | PRN

## 2019-11-29 MED ORDER — FLUOXETINE HCL 20 MG PO CAPS
20.0000 mg | ORAL_CAPSULE | Freq: Every day | ORAL | Status: DC
  Administered 2019-11-30: 20 mg via ORAL
  Filled 2019-11-29: qty 1

## 2019-11-29 MED ORDER — ACETAMINOPHEN 160 MG/5ML PO SOLN: 650.0000 mg | ORAL | Status: DC | PRN

## 2019-11-29 MED ORDER — SODIUM CHLORIDE 0.9 % IV SOLN: Freq: Once | INTRAVENOUS | Status: AC

## 2019-11-29 NOTE — ED Provider Notes (Signed)
[facility] Emergency Department Provider Note MRN:  [id number]  Arrival date & time: 11/29/19     Chief Complaint   Facial Droop   History of Present Illness   Jaclyn Lopez is a 69 y.o. year-old female with a history of thyroid disease, IBS presenting to the ED with chief complaint of facial droop.  Facial droop since yesterday at 4 PM.  Involving the left side of the face, only the lower portion.  Explains that it happened briefly the day before while working out but went away.  Has been constant since yesterday 4 PM.  Also endorsing a numbness sensation to this side of the face as well, also endorsing slurred speech.  Denies visual change, no headache, no chest pain or shortness of breath, no numbness or weakness to the arms or legs.  Review of Systems  A complete 10 system review of systems was obtained and all systems are negative except as noted in the HPI and PMH.   Patient's Health History    Past Medical History:  Diagnosis Date  . Atypical glandular cells on Pap smear 2003  . Cancer ([place]) 2005   Breast-Ductal CIS Right breast-Radiation - no lymph nodes removed per pt  . CKD (chronic kidney disease)   . Depression   . GERD (gastroesophageal reflux disease)   . Herpes progenitalis   . 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 Hyperthyroidism   . Hypothyroidism   . IBS (irritable bowel syndrome)   . IC (interstitial cystitis)   . Insomnia   . 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 Subsequent LEEP showed CIN-2 with clear margins and negative ECC  . Osteopenia 11/2018   T score -1.5 FRAX 9% / 1% improved at both hips stable at spine  . Thyroid disease    Hyperthyroid    Past Surgical History:  Procedure Laterality Date  . Bladder stretch and Bx  1990  . BREAST LUMPECTOMY  2005   right; cancer  . BREAST LUMPECTOMY  2006   left;  pre cancer  . BREAST SURGERY     Reduction  . BUNIONECTOMY  2007, 2011  . CERVICAL CONE BIOPSY  2003  . CESAREAN SECTION  1983  . DILATION AND CURETTAGE OF UTERUS  2009  . ENDOMETRIAL ABLATION     Novasure  . GANGLION CYST EXCISION Left 1985  . HYSTEROSCOPY  2009  . LEEP  05/2015   CIN-2 with clear margins  . MASTECTOMY, PARTIAL    . MOUTH SURGERY  2012   implants  . TUBAL LIGATION  1984    Family History  Problem Relation Age of Onset  . COPD Mother   . Dementia Mother   . Bipolar disorder Mother   . Lung cancer Father   . Cancer - Lung Father   . Breast cancer Maternal Grandmother        Age 52's  . Colon cancer Neg Hx   . Stomach cancer Neg Hx   . Pancreatic cancer Neg Hx   . Esophageal cancer Neg Hx   . Rectal cancer Neg Hx     Social History   Socioeconomic History  . Marital status: Divorced    Spouse name: Not on file  . Number of children: 2  . Years of education: Not on file  . Highest education level: Not on file  Occupational History  . Occupation:  teacher    Comment: [employer]  Tobacco Use  . Smoking status: Former Smoker    Types: Cigarettes    Quit date: 05/17/1996    Years since quitting: 23.5  . Smokeless tobacco: Never Used  Vaping Use  . Vaping Use: Never used  Substance and Sexual Activity  . Alcohol use: Yes    Alcohol/week: 0.0 standard drinks    Comment: once a month  . Drug use: No  . Sexual activity: Never    Birth control/protection: Surgical    Comment: 1st intercourse 69 yo-More than 5 partners  Other Topics Concern  . Not on file  Social History Narrative   Divorced   rare EtOH, no tobacco, drugs   Teached preK at [employer] Strain:   . Difficulty of Paying Living Expenses:   Food Insecurity:   . Worried About Charity fundraiser in the Last Year:   . Arboriculturist in the Last Year:   Transportation Needs:   . Film/video editor (Medical):   Marland Kitchen Lack of  Transportation (Non-Medical):   Physical Activity:   . Days of Exercise per Week:   . Minutes of Exercise per Session:   Stress:   . Feeling of Stress :   Social Connections:   . Frequency of Communication with Friends and Family:   . Frequency of Social Gatherings with Friends and Family:   . Attends Religious Services:   . Active Member of Clubs or Organizations:   . Attends Archivist Meetings:   Marland Kitchen Marital Status:   Intimate Partner Violence:   . Fear of Current or Ex-Partner:   . Emotionally Abused:   Marland Kitchen Physically Abused:   . Sexually Abused:      Physical Exam   Vitals:   11/29/19 1210 11/29/19 1211  BP:    Pulse: 68 69  Resp: 14 16  Temp:    SpO2: 100% 100%    CONSTITUTIONAL: Well-appearing, NAD NEURO:  Alert and oriented x 3, left-sided facial droop with sparing of the left forehead and left periorbital region EYES:  eyes equal and reactive, normal extraocular movements, no nystagmus ENT/NECK:  no LAD, no JVD CARDIO: Regular rate, well-perfused, normal S1 and S2 PULM:  CTAB no wheezing or rhonchi GI/GU:  normal bowel sounds, non-distended, non-tender MSK/SPINE:  No gross deformities, no edema SKIN:  no rash, atraumatic PSYCH:  Appropriate speech and behavior  *Additional and/or pertinent findings included in MDM below  Diagnostic and Interventional Summary    EKG Interpretation  Date/Time:  Thursday November 29 2019 11:01:54 EDT Ventricular Rate:  72 PR Interval:    QRS Duration: 93 QT Interval:  415 QTC Calculation: 455 R Axis:   27 Text Interpretation: Sinus rhythm Consider left atrial enlargement RSR' in V1 or V2, probably normal variant Confirmed by Gerlene Fee [phone]) on 11/29/2019 12:34:44 PM      Labs Reviewed  COMPREHENSIVE METABOLIC PANEL - Abnormal; Notable for the following components:      Result Value   Creatinine, Ser 1.26 (*)    GFR calc non Af Amer 44 (*)    GFR calc Af Amer 51 (*)    All other components within normal  limits  URINALYSIS, ROUTINE W REFLEX MICROSCOPIC - Abnormal; Notable for the following components:   Color, Urine STRAW (*)    Specific Gravity, Urine 1.004 (*)    All other components within normal limits  SARS CORONAVIRUS  2 BY RT PCR (HOSPITAL ORDER, [facility])  ETHANOL  PROTIME-INR  APTT  CBC  DIFFERENTIAL  RAPID URINE DRUG SCREEN, HOSP PERFORMED    MR BRAIN WO CONTRAST  Final Result    CT Head Wo Contrast  Final Result      Medications  aspirin chewable tablet 324 mg (has no administration in time range)     Procedures  /  Critical Care .Critical Care Performed by: Maudie Flakes, MD Authorized by: Maudie Flakes, MD   Critical care provider statement:    Critical care time (minutes):  32   Critical care was necessary to treat or prevent imminent or life-threatening deterioration of the following conditions: Acute ischemic stroke.   Critical care was time spent personally by me on the following activities:  Discussions with consultants, evaluation of patient's response to treatment, examination of patient, ordering and performing treatments and interventions, ordering and review of laboratory studies, ordering and review of radiographic studies, pulse oximetry, re-evaluation of patient's condition, obtaining history from patient or surrogate and review of old charts    ED Course and Medical Decision Making  I have reviewed the triage vital signs, the nursing notes, and pertinent available records from the EMR.  Listed above are laboratory and imaging tests that I personally ordered, reviewed, and interpreted and then considered in my medical decision making (see below for details).      Concern for acute ischemic stroke, had also considered Bell's palsy but there is sparing of the forehead and periorbital region which raises suspicion for stroke.  Will obtain labs, CT head.  Symptoms occurring yesterday at 4 PM, outside of window for TPA.   There is no visual field cuts, no aphasia, no neglect, outside of window for LVO intervention.  CT head is unremarkable, MRI is positive for acute infarct, will admit to medicine and consult neurology.  Barth Kirks. Sedonia Small, [place] [email or web address]  Final Clinical Impressions(s) / ED Diagnoses     ICD-10-CM   1. Facial droop  R29.810   2. Acute ischemic stroke [facility])  I63.9     ED Discharge Orders    None       Discharge Instructions Discussed with and Provided to Patient:   Discharge Instructions   None       Maudie Flakes, MD 11/29/19 1456

## 2019-11-29 NOTE — H&P (Signed)
History and Physical    Jaclyn Lopez RDE:[id number] DOB: 02-17-1951 DOA: 11/29/2019  Referring MD/NP/PA: Voncille Lo, MD PCP: Deland Pretty, MD  Patient coming from: Home  Chief Complaint:    I have personally briefly reviewed patient's old medical records in [place]   HPI: Jaclyn Lopez is a 69 y.o. female with medical history significant of hypothyroidism, breast cancer s/p lumpectomy with radiation, IBS, CKD stage III presents with complaints of left side of her mouth drooping.  Patient feels that she was in her normal state of health up until around 4 PM yesterday.  She noticed that her speech was slurred and she had a little bit of facial droop on the left side of her mouth.  Notes that it feels like her tongue is thick on left side and she had associated symptoms of slurred speech.  Whenever she tried to drink anything she noted that it was dribbling out the side of her mouth.  Denies having any change in vision, headaches, palpitations, chest pain, nausea, vomiting, diarrhea, dysuria, or any other focal weakness.  She had just recently had her annual exam with her primary care provider where she was told everything was good.  She reports she is never required blood pressure work cholesterol-lowering medication.  Patient reports possibly smoking cigarettes 1-2 years over 25 years ago and only rarely drinks alcohol.  ED Course: Upon admission into the emergency department patient was noted to have relatively normal vital signs.  CT scan of the brain without contrast showed mild chronic small vessel disease.  Labs significant for creatinine 1.26.  Urine drug screen was negative.  Urinalysis did not show signs of infection.  Due to patient symptoms MRI of the brain was obtained revealing a acute infarction revealed an acute 1 cm infarct of the right posterior frontal region.  Patient had been given full dose aspirin.  Neurology was formally consulted.  TRH called to  admit  Review of Systems  Constitutional: Negative for fever and malaise/fatigue.  HENT: Negative for congestion and nosebleeds.   Eyes: Negative for photophobia and pain.  Respiratory: Negative for cough and shortness of breath.   Cardiovascular: Negative for chest pain, palpitations and leg swelling.  Gastrointestinal: Negative for abdominal pain, nausea and vomiting.  Genitourinary: Negative for dysuria and hematuria.  Musculoskeletal: Negative for falls and myalgias.  Skin: Negative for rash.  Neurological: Positive for speech change and focal weakness. Negative for headaches.  Psychiatric/Behavioral: Positive for memory loss. Negative for substance abuse.    Past Medical History:  Diagnosis Date  . Atypical glandular cells on Pap smear 2003  . Cancer ([place]) 2005   Breast-Ductal CIS Right breast-Radiation - no lymph nodes removed per pt  . CKD (chronic kidney disease)   . Depression   . GERD (gastroesophageal reflux disease)   . Herpes progenitalis   . 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 Hyperthyroidism   . Hypothyroidism   . IBS (irritable bowel syndrome)   . IC (interstitial cystitis)   . Insomnia   . 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 Subsequent LEEP showed CIN-2 with clear margins and negative ECC  . Osteopenia 11/2018   T score -1.5 FRAX 9% / 1% improved at both hips stable at spine  . Thyroid disease    Hyperthyroid    Past Surgical History:  Procedure Laterality Date  . Bladder stretch and  Bx  1990  . BREAST LUMPECTOMY  2005   right; cancer  . BREAST LUMPECTOMY  2006   left; pre cancer  . BREAST SURGERY     Reduction  . BUNIONECTOMY  2007, 2011  . CERVICAL CONE BIOPSY  2003  . CESAREAN SECTION  1983  . DILATION AND CURETTAGE OF UTERUS  2009  . ENDOMETRIAL ABLATION     Novasure  . GANGLION CYST EXCISION Left 1985  . HYSTEROSCOPY  2009  .  LEEP  05/2015   CIN-2 with clear margins  . MASTECTOMY, PARTIAL    . MOUTH SURGERY  2012   implants  . TUBAL LIGATION  1984     reports that she quit smoking about 23 years ago. Her smoking use included cigarettes. She has never used smokeless tobacco. She reports current alcohol use. She reports that she does not use drugs.  Allergies  Allergen Reactions  . Symmetrel [Amantadine Hcl] Hives  . Tamiflu [Oseltamivir Phosphate] Hives    Family History  Problem Relation Age of Onset  . COPD Mother   . Dementia Mother   . Bipolar disorder Mother   . Lung cancer Father   . Cancer - Lung Father   . Breast cancer Maternal Grandmother        Age 73's  . Colon cancer Neg Hx   . Stomach cancer Neg Hx   . Pancreatic cancer Neg Hx   . Esophageal cancer Neg Hx   . Rectal cancer Neg Hx     Prior to Admission medications   Medication Sig Start Date End Date Taking? Authorizing Provider  acyclovir (ZOVIRAX) 200 MG capsule Take 1 capsule (200 mg total) by mouth once a week. 11/21/19  Yes Juleen China, Tiffany A, NP  BIOTIN PO Take 1 tablet by mouth daily.    Yes [provider]  buPROPion (WELLBUTRIN XL) 150 MG 24 hr tablet Take 150 mg by mouth every morning. 11/28/19  Yes [provider]  Cholecalciferol (VITAMIN D PO) Take 1,000 Units by mouth daily.    Yes [provider]  FLUoxetine (PROZAC) 20 MG capsule Take 20 mg by mouth daily.    Yes [provider]  levothyroxine (SYNTHROID, LEVOTHROID) 50 MCG tablet Take 50 mcg by mouth daily.    Yes [provider]  mirtazapine (REMERON) 15 MG tablet Take 3.75 mg by mouth at bedtime.    Yes [provider]  zaleplon (SONATA) 10 MG capsule Take 10 mg by mouth at bedtime as needed for sleep. 11/28/19  Yes [provider]    Physical Exam:  Constitutional: Older female who appears to be in no acute distress Vitals:   11/29/19 1102 11/29/19 1209 11/29/19 1210 11/29/19 1211  BP: 124/66      Pulse: 71 74 68 69  Resp: 17 17 14 16   Temp:      TempSrc:      SpO2: 100% 100% 100% 100%   Eyes: PERRL, lids and conjunctivae normal ENMT: Mucous membranes are moist.  Drooping of the left side of the mouth. Neck: normal, supple, no masses, no thyromegaly Respiratory: clear to auscultation bilaterally, no wheezing, no crackles. Normal respiratory effort. No accessory muscle use.  Cardiovascular: Regular rate and rhythm, no murmurs / rubs / gallops. No extremity edema. 2+ pedal pulses. No carotid bruits.  Abdomen: no tenderness, no masses palpated. No hepatosplenomegaly. Bowel sounds positive.  Musculoskeletal: no clubbing / cyanosis. No joint deformity upper and lower extremities. Good ROM, no contractures. Normal  muscle tone.  Skin: no rashes, lesions, ulcers. No induration Neurologic: CN 2-12 grossly intact. Strength 5/5 in all 4.  Psychiatric: Normal judgment and insight. Alert and oriented x 3. Normal mood.     Labs on Admission: I have personally reviewed following labs and imaging studies  CBC: Recent Labs  Lab 11/29/19 1034  WBC 7.3  NEUTROABS 4.4  HGB 13.9  HCT 43.3  MCV 88.9  PLT 765   Basic Metabolic Panel: Recent Labs  Lab 11/29/19 1034  NA 140  K 4.5  CL 105  CO2 26  GLUCOSE 82  BUN 14  CREATININE 1.26*  CALCIUM 9.3   GFR: Estimated Creatinine Clearance: 40.6 mL/min (A) (by C-G formula based on SCr of 1.26 mg/dL (H)). Liver Function Tests: Recent Labs  Lab 11/29/19 1034  AST 20  ALT 18  ALKPHOS 112  BILITOT 0.8  PROT 6.7  ALBUMIN 4.0   No results for input(s): LIPASE, AMYLASE in the last 168 hours. No results for input(s): AMMONIA in the last 168 hours. Coagulation Profile: Recent Labs  Lab 11/29/19 1034  INR 1.0   Cardiac Enzymes: No results for input(s): CKTOTAL, CKMB, CKMBINDEX, TROPONINI in the last 168 hours. BNP (last 3 results) No results for input(s): PROBNP in the last 8760 hours. HbA1C: No results for input(s): HGBA1C in  the last 72 hours. CBG: No results for input(s): GLUCAP in the last 168 hours. Lipid Profile: No results for input(s): CHOL, HDL, LDLCALC, TRIG, CHOLHDL, LDLDIRECT in the last 72 hours. Thyroid Function Tests: No results for input(s): TSH, T4TOTAL, FREET4, T3FREE, THYROIDAB in the last 72 hours. Anemia Panel: No results for input(s): VITAMINB12, FOLATE, FERRITIN, TIBC, IRON, RETICCTPCT in the last 72 hours. Urine analysis:    Component Value Date/Time   COLORURINE STRAW (A) 11/29/2019 1140   APPEARANCEUR CLEAR 11/29/2019 1140   LABSPEC 1.004 (L) 11/29/2019 1140   PHURINE 7.0 11/29/2019 1140   GLUCOSEU NEGATIVE 11/29/2019 1140   HGBUR NEGATIVE 11/29/2019 1140   BILIRUBINUR NEGATIVE 11/29/2019 1140   KETONESUR NEGATIVE 11/29/2019 1140   PROTEINUR NEGATIVE 11/29/2019 1140   UROBILINOGEN 0.2 08/30/2014 1122   NITRITE NEGATIVE 11/29/2019 1140   LEUKOCYTESUR NEGATIVE 11/29/2019 1140   Sepsis Labs: No results found for this or any previous visit (from the past 240 hour(s)).   Radiological Exams on Admission: CT Head Wo Contrast  Result Date: 11/29/2019 CLINICAL DATA:  Left facial droop EXAM: CT HEAD WITHOUT CONTRAST TECHNIQUE: Contiguous axial images were obtained from the base of the skull through the vertex without intravenous contrast. COMPARISON:  None. FINDINGS: Brain: Mild chronic small vessel disease throughout the deep white matter. No acute intracranial abnormality. Specifically, no hemorrhage, hydrocephalus, mass lesion, acute infarction, or significant intracranial injury. Vascular: No hyperdense vessel or unexpected calcification. Skull: No acute calvarial abnormality. Sinuses/Orbits: Visualized paranasal sinuses and mastoids clear. Orbital soft tissues unremarkable. Other: None IMPRESSION: Mild chronic small vessel disease. No acute intracranial abnormality. Electronically Signed   By: Rolm Baptise M.D.   On: 11/29/2019 11:25   MR BRAIN WO CONTRAST  Result Date:  11/29/2019 CLINICAL DATA:  Left facial weakness. EXAM: MRI HEAD WITHOUT CONTRAST TECHNIQUE: Multiplanar, multiecho pulse sequences of the brain and surrounding structures were obtained without intravenous contrast. COMPARISON:  Head CT same day FINDINGS: Brain: Diffusion imaging shows acute infarction in the subcortical white matter of the right posterior frontal region. No other acute infarction. No abnormality affects the brainstem or cerebellum. Moderate chronic small-vessel ischemic changes elsewhere within the cerebral  hemispheric white matter. Old small left frontal cortical infarction. No mass lesion, hemorrhage, hydrocephalus or extra-axial collection. Vascular: Major vessels at the base of the brain show flow. Skull and upper cervical spine: Negative Sinuses/Orbits: Clear/normal Other: None IMPRESSION: Approximately 1 cm acute infarction in the subcortical white matter of the right posterior frontal region. No mass effect or hemorrhage. Chronic small-vessel ischemic changes elsewhere affecting the hemispheric white matter. Old small left frontal cortical infarction. Electronically Signed   By: Nelson Chimes M.D.   On: 11/29/2019 14:15    EKG: Independently reviewed.  Sinus rhythm at 72 bpm  Assessment/Plan CVA: Acute/subacute.  Patient presents with left-sided facial droop since yesterday.  Urine drug screen negative for any acute abnormalities.  MRI significant for approximately 1 cm acute infarction of the subcortical white matter in the right posterior frontal region. -Admit to telemetry bed -Stroke order set initiated -Neuro checks -Add-on Hemoglobin A1c and lipid panel in a.m. -Check CT angio of the head and neck -PT/OT/Speech to eval and treat -Check echocardiogram -Allow for permissive hypertension  -ASA -Follow-up telemetry overnight -Appreciate neurology consultative services, will follow-up   Chronic kidney disease stage IIIb: Stable.  Creatinine on admission noted to be 1.26 to  which the patient reports this is near her baseline.  Creatinine had been 1.1 from 2012 scanned in records. -Gentle IV fluids of normal saline at 75 mL/h x1 L -Recheck creatinine in a.m.  Hypothyroidism: Home medications include levothyroxine 50 mcg daily -Continue levothyroxine  Depression -Continue Prozac and Wellbutrin  COVID-19 screening: Pending.  DVT prophylaxis: lovenox Code Status: Full Family Communication: Patient's) updated at bedside Disposition Plan: Likely discharge home once medically stable Consults called: Neurology Admission status: inpatient   Norval Morton MD [facility] Pager [phone]   If 7PM-7AM, please contact night-coverage [email or web address] Password [email or web address]  11/29/2019, 2:59 PM

## 2019-11-29 NOTE — ED Triage Notes (Addendum)
Pt reports L sided facial droop that started yesterday afternoon.  States she has been drooling when trying to drink liquids.  No arm drift or weakness.  Pt reports slurred speech but speech sounds clear at this time.  Dr. Sedonia Small at triage to assess pt.

## 2019-11-29 NOTE — ED Notes (Signed)
Pt passed swallow screen before water and food was given

## 2019-11-29 NOTE — Consult Note (Addendum)
Neurology Consultation  Reason for Consult: Stroke Referring Physician: Dr. Gerlene Fee  CC: Stroke  History is obtained from: Patient  HPI: ROMEKA Lopez is a 69 y.o. female with history of chronic kidney disease, breast cancer, thyroid disease. yesterday she did go to her PCP for general checkup and at that time she was told everything was normal. Later at approximately 4:00 she noticed that she was slurring her speech and she had a left facial droop.  Patient went to sleep and upon waking noted that she was still slurring her speech when she was talking on the phone with a friend.  Patient did not come to the emergency department as she did not think it was urgent.  Today because her symptoms continue to occur at approximately 10 AM she did come to the emergency department.  And continues to have the same symptoms.  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  She has no other neurological complaints.  Of note patient does not take aspirin, or statin at this point time.  LKW: 1600 hrs. on 11/28/2019 tpa given?: no, out of window Premorbid modified Rankin scale (mRS): 0 NIH stroke score: 2, 1 for facial droop and 1 for dysarthria   Past Medical History:  Diagnosis Date  . Atypical glandular cells on Pap smear 2003  . 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 CKD (chronic kidney disease)   . Depression   . GERD (gastroesophageal reflux disease)   . Herpes progenitalis   . 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 Hyperthyroidism   . Hypothyroidism   . IBS (irritable bowel syndrome)   . IC (interstitial cystitis)   . Insomnia   . 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 Subsequent LEEP showed CIN-2 with clear margins and negative ECC  . Osteopenia 11/2018   T  score -1.5 FRAX 9% / 1% improved at both hips stable at spine  . Thyroid disease    Hyperthyroid    Family History  Problem Relation Age of Onset  . COPD Mother   . Dementia Mother   . Bipolar disorder Mother   . Lung cancer Father   . Cancer - Lung Father   . Breast cancer Maternal Grandmother        Age 54's  . Colon cancer Neg Hx   . Stomach cancer Neg Hx   . Pancreatic cancer Neg Hx   . Esophageal cancer Neg Hx   . Rectal cancer Neg Hx    Social History:   reports that she quit smoking about 23 years ago. Her smoking use included cigarettes. She has never used smokeless tobacco. She reports current alcohol use. She reports that she does not use drugs.  Medications  Current Facility-Administered Medications:  .   stroke: mapping our early stages of recovery book, , Does not apply, Once, Smith, Rondell A, MD .  0.9 %  sodium chloride infusion, , Intravenous, Once, Smith, Rondell A, MD .  acetaminophen (TYLENOL) tablet 650 mg, 650 mg, Oral, Q4H PRN **OR** acetaminophen (TYLENOL) 160 MG/5ML solution 650 mg, 650 mg, Per Tube, Q4H PRN **OR** acetaminophen (TYLENOL) suppository 650 mg, 650 mg, Rectal, Q4H PRN, Smith, Rondell A, MD .  aspirin chewable tablet 324 mg, 324 mg, Oral, Once, Smith, Rondell A,  MD .  Derrill Memo ON 11/30/2019] aspirin suppository 300 mg, 300 mg, Rectal, Daily **OR** [START ON 11/30/2019] aspirin tablet 325 mg, 325 mg, Oral, Daily, Smith, Rondell A, MD .  Derrill Memo ON 11/30/2019] buPROPion (WELLBUTRIN XL) 24 hr tablet 150 mg, 150 mg, Oral, q morning - 10a, Smith, Rondell A, MD .  enoxaparin (LOVENOX) injection 40 mg, 40 mg, Subcutaneous, Q24H, Smith, Rondell A, MD .  FLUoxetine (PROZAC) capsule 20 mg, 20 mg, Oral, Daily, Tamala Julian, Rondell A, MD .  Derrill Memo ON 11/30/2019] levothyroxine (SYNTHROID) tablet 50 mcg, 50 mcg, Oral, Daily, Smith, Rondell A, MD .  mirtazapine (REMERON) tablet 3.75 mg, 3.75 mg, Oral, QHS, Smith, Rondell A, MD .  zolpidem (AMBIEN) tablet 5 mg, 5 mg,  Oral, QHS PRN,MR X 1, Smith, Rondell A, MD  Current Outpatient Medications:  .  acyclovir (ZOVIRAX) 200 MG capsule, Take 1 capsule (200 mg total) by mouth once a week., Disp: 30 capsule, Rfl: 1 .  BIOTIN PO, Take 1 tablet by mouth daily. , Disp: , Rfl:  .  buPROPion (WELLBUTRIN XL) 150 MG 24 hr tablet, Take 150 mg by mouth every morning., Disp: , Rfl:  .  Cholecalciferol (VITAMIN D PO), Take 1,000 Units by mouth daily. , Disp: , Rfl:  .  FLUoxetine (PROZAC) 20 MG capsule, Take 20 mg by mouth daily. , Disp: , Rfl:  .  levothyroxine (SYNTHROID, LEVOTHROID) 50 MCG tablet, Take 50 mcg by mouth daily. , Disp: , Rfl:  .  mirtazapine (REMERON) 15 MG tablet, Take 3.75 mg by mouth at bedtime. , Disp: , Rfl:  .  zaleplon (SONATA) 10 MG capsule, Take 10 mg by mouth at bedtime as needed for sleep., Disp: , Rfl:   ROS:   General ROS: negative for - chills, fatigue, fever, night sweats, weight gain or weight loss Psychological ROS: negative for - behavioral disorder, hallucinations, memory difficulties, mood swings or suicidal ideation Ophthalmic ROS: negative for - blurry vision, double vision, eye pain or loss of vision ENT ROS: negative for - epistaxis, nasal discharge, oral lesions, sore throat, tinnitus or vertigo Allergy and Immunology ROS: negative for - hives or itchy/watery eyes Hematological and Lymphatic ROS: negative for - bleeding problems, bruising or swollen lymph nodes Endocrine ROS: negative for - galactorrhea, hair pattern changes, polydipsia/polyuria or temperature intolerance Respiratory ROS: negative for - cough, hemoptysis, shortness of breath or wheezing Cardiovascular ROS: negative for - chest pain, dyspnea on exertion, edema or irregular heartbeat Gastrointestinal ROS: negative for - abdominal pain, diarrhea, hematemesis, nausea/vomiting or stool incontinence Genito-Urinary ROS: Positive for -  muscular weakness Neurological ROS: as noted in HPI Dermatological ROS: negative for  rash and skin lesion changes  Exam: Current vital signs: BP 124/66   Pulse 69   Temp 98.5 F (36.9 C) (Oral)   Resp 16   SpO2 100%  Vital signs in last 24 hours: Temp:  [98.5 F (36.9 C)] 98.5 F (36.9 C) (07/15 1017) Pulse Rate:  [68-78] 69 (07/15 1211) Resp:  [14-20] 16 (07/15 1211) BP: (124-144)/(64-66) 124/66 (07/15 1102) SpO2:  [100 %] 100 % (07/15 1514) FiO2 (%):  [21 %] 21 % (07/15 1514)   Constitutional: Appears well-developed and well-nourished.  Psych: Affect appropriate to situation Eyes: No scleral injection HENT: No OP obstrucion Head: Normocephalic.  Cardiovascular: Normal rate and regular rhythm.  Respiratory: Effort normal, non-labored breathing GI: Soft.  No distension. There is no tenderness.  Skin: WDI  Neuro: Mental Status: Patient is awake, alert, oriented to person,  place, month, year, and situation.  Patient speech is slightly dysarthric however naming, repeating, repetition is intact there is no aphasia.  Patient is able to follow commands with no difficulty.  Patient is able to give a good clear history.  Cranial Nerves: II: Visual Fields are full.  III,IV, VI: EOMI without ptosis or diploplia. Pupils equal, round and reactive to light V: Facial sensation is symmetric to temperature VII: Left facial droop VIII: hearing is intact to voice X: Palat elevates symmetrically XI: Shoulder shrug is symmetric. XII: tongue is midline without atrophy or fasciculations.  Motor: Left arm and leg 4/5 and right arm and leg 5/5 No drift Sensory: Sensation is symmetric to light touch and temperature in the arms and legs.  Deep Tendon Reflexes: 2+ and symmetric in the biceps and patellae, however the left patella has 2 beats of clonus.  In addition with ankle jerk left ankle has 2 beats of clonus while the right ankle does not. Plantars: Toes are downgoing bilaterally.  Cerebellar: FNF and HKS are intact bilaterally  Labs I have reviewed labs in epic  and the results pertinent to this consultation are:   CBC    Component Value Date/Time   WBC 7.3 11/29/2019 1034   RBC 4.87 11/29/2019 1034   HGB 13.9 11/29/2019 1034   HCT 43.3 11/29/2019 1034   PLT 232 11/29/2019 1034   MCV 88.9 11/29/2019 1034   MCH 28.5 11/29/2019 1034   MCHC 32.1 11/29/2019 1034   RDW 13.2 11/29/2019 1034   LYMPHSABS 2.0 11/29/2019 1034   MONOABS 0.7 11/29/2019 1034   EOSABS 0.1 11/29/2019 1034   BASOSABS 0.1 11/29/2019 1034    CMP     Component Value Date/Time   NA 140 11/29/2019 1034   K 4.5 11/29/2019 1034   CL 105 11/29/2019 1034   CO2 26 11/29/2019 1034   GLUCOSE 82 11/29/2019 1034   BUN 14 11/29/2019 1034   CREATININE 1.26 (H) 11/29/2019 1034   CALCIUM 9.3 11/29/2019 1034   PROT 6.7 11/29/2019 1034   ALBUMIN 4.0 11/29/2019 1034   AST 20 11/29/2019 1034   ALT 18 11/29/2019 1034   ALKPHOS 112 11/29/2019 1034   BILITOT 0.8 11/29/2019 1034   GFRNONAA 44 (L) 11/29/2019 1034   GFRAA 51 (L) 11/29/2019 1034    Lipid Panel  No results found for: CHOL, TRIG, HDL, CHOLHDL, VLDL, LDLCALC, LDLDIRECT   Imaging I have reviewed the images obtained:  CT-scan of the brain-mild chronic small vessel disease  MRI examination of the brain-approximately 1 cm acute infarct in right subcortical white matter of the posterior frontal lobe.  No mass-effect or hemorrhage.  Old small left frontal cortical infarction.  Etta Quill PA-C [facility] [phone]  M-F  (9:00 am- 5:00 PM)  11/29/2019, 5:11 PM     Assessment:  See 69 year old female presenting with left-sided weakness, left facial droop and dysarthria with MRI that reveals a right acute infarct in the subcortical white matter of the posterior frontal lobe.  Impression: -Stroke  Recommend -MRA head and neck w/o due to CKD -Transthoracic Echo  -Start patient on ASA 325mg  daily, Plavix 75 mg daily x 3 weeks  -Start or continue Atorvastatin 80 mg/other high intensity statin -BP  goal: permissive HTN upto 220/120 mmHg -HBAIC and Lipid profile -Telemetry monitoring -Frequent neuro checks -NPO until passes stroke swallow screen -PT/OT # please page stroke NP  Or  PA  Or MD from 8am -4 pm  as this patient from this  time will be  followed by the stroke.   You can look them up on [email or web address]  [place]   Attending Neurohospitalist Addendum Patient seen and examined with APP/Resident. Agree with the history and physical as documented above. Agree with the plan as documented, which I helped formulate. I have independently reviewed the chart, obtained history, review of systems and examined the patient.I have personally reviewed pertinent head/neck/spine imaging (CT/MRI). Please feel free to call with any questions. --- Amie Portland, MD [facility] Pager: [phone]  If 7pm to 7am, please call on call as listed on AMION.

## 2019-11-30 ENCOUNTER — Other Ambulatory Visit: Payer: Self-pay | Admitting: Cardiology

## 2019-11-30 ENCOUNTER — Other Ambulatory Visit: Payer: Self-pay | Admitting: Neurology

## 2019-11-30 ENCOUNTER — Inpatient Hospital Stay (HOSPITAL_BASED_OUTPATIENT_CLINIC_OR_DEPARTMENT_OTHER): Payer: Medicare PPO

## 2019-11-30 DIAGNOSIS — I6389 Other cerebral infarction: Secondary | ICD-10-CM | POA: Diagnosis not present

## 2019-11-30 DIAGNOSIS — I63311 Cerebral infarction due to thrombosis of right middle cerebral artery: Secondary | ICD-10-CM | POA: Diagnosis not present

## 2019-11-30 DIAGNOSIS — E039 Hypothyroidism, unspecified: Secondary | ICD-10-CM | POA: Diagnosis not present

## 2019-11-30 DIAGNOSIS — I639 Cerebral infarction, unspecified: Secondary | ICD-10-CM | POA: Diagnosis not present

## 2019-11-30 DIAGNOSIS — F329 Major depressive disorder, single episode, unspecified: Secondary | ICD-10-CM | POA: Diagnosis not present

## 2019-11-30 DIAGNOSIS — N1832 Chronic kidney disease, stage 3b: Secondary | ICD-10-CM | POA: Diagnosis not present

## 2019-11-30 LAB — ECHOCARDIOGRAM COMPLETE BUBBLE STUDY
Area-P 1/2: 2.56 cm2
Calc EF: 59.7 %
S' Lateral: 2.7 cm
Single Plane A2C EF: 60.4 %
Single Plane A4C EF: 58.6 %
Weight: 2232.82 oz

## 2019-11-30 LAB — CREATININE, SERUM
Creatinine, Ser: 1.23 mg/dL — ABNORMAL HIGH (ref 0.44–1.00)
GFR calc Af Amer: 53 mL/min — ABNORMAL LOW (ref >60)
GFR calc non Af Amer: 45 mL/min — ABNORMAL LOW (ref >60)

## 2019-11-30 MED ORDER — CLOPIDOGREL BISULFATE 75 MG PO TABS
75.0000 mg | ORAL_TABLET | Freq: Every day | ORAL | Status: DC
  Administered 2019-11-30: 75 mg via ORAL
  Filled 2019-11-30: qty 1

## 2019-11-30 MED ORDER — ASPIRIN 81 MG PO TBEC: 81.0000 mg | DELAYED_RELEASE_TABLET | Freq: Every day | ORAL | 3 refills | Status: AC

## 2019-11-30 MED ORDER — ASPIRIN EC 81 MG PO TBEC
81.0000 mg | DELAYED_RELEASE_TABLET | Freq: Every day | ORAL | Status: DC
  Administered 2019-11-30: 81 mg via ORAL
  Filled 2019-11-30: qty 1

## 2019-11-30 MED ORDER — ATORVASTATIN CALCIUM 80 MG PO TABS: 80.0000 mg | ORAL_TABLET | Freq: Every day | ORAL | 0 refills | Status: DC

## 2019-11-30 MED ORDER — CLOPIDOGREL BISULFATE 75 MG PO TABS: 75.0000 mg | ORAL_TABLET | Freq: Every day | ORAL | 0 refills | Status: AC

## 2019-11-30 MED ORDER — ATORVASTATIN CALCIUM 80 MG PO TABS
80.0000 mg | ORAL_TABLET | Freq: Every day | ORAL | Status: DC
  Administered 2019-11-30: 80 mg via ORAL
  Filled 2019-11-30: qty 1

## 2019-11-30 NOTE — Evaluation (Signed)
Occupational Therapy Evaluation and Discharge Patient Details Name: Jaclyn Lopez MRN: [id number] DOB: [birth year] Today's Date: 11/30/2019    History of Present Illness Jaclyn Lopez is a 69 y.o. female with PMH including CKD, breast cancer, thyroid disease. Pt experienced L sided weakness, slurred speech and left facial droop. MRI revealed a 1 cm acute infarction in the subcortical white matter of the posterior frontal region.   Clinical Impression   PTA Pt independent in ADL and mobility. Very active, lives alone but has son and family close by for assist if needed. Today was able to demonstrate all aspects of UB and LB ADL at mod I level. Strength in LUE 4/5 and slight fine motor deficits - but completely functional. Able to open all containers for grooming, toilet etc. Pt with good balance and dexterity for LB ADL. Reviewed signs and symptoms of stroke. Pt's biggest complaint is that her left side of her face feels like it has "novicane" and during oral care she did drip toothpaste and water down (she was aware and compensated for it). Answered all questions and Left Pt in bed, chatting with friends/visitors and all needs met. OT to sign off at this time.     Follow Up Recommendations  No OT follow up    Equipment Recommendations  None recommended by OT    Recommendations for Other Services Speech consult     Precautions / Restrictions Precautions Precautions: Fall Restrictions Weight Bearing Restrictions: No      Mobility Bed Mobility Overal bed mobility: Independent                Transfers Overall transfer level: Independent Equipment used: None             General transfer comment: able to complete sit to stand independently, light min guard provided for safety but not needed    Balance Overall balance assessment: Mild deficits observed, not formally tested                               Standardized Balance Assessment Standardized  Balance Assessment : Dynamic Gait Index         ADL either performed or assessed with clinical judgement   ADL Overall ADL's : Modified independent                                       General ADL Comments: able to don/doff LB dressing, sink level grooming, toilet transfer, Pt with no cognitive or visual deficits. Noted "dribble" during oral care due to decreased sensation and motor control in L face     Vision Baseline Vision/History: Wears glasses Wears Glasses: Reading only Patient Visual Report: No change from baseline Vision Assessment?: No apparent visual deficits     Perception     Praxis      Pertinent Vitals/Pain Pain Assessment: No/denies pain     Hand Dominance Right   Extremity/Trunk Assessment Upper Extremity Assessment Upper Extremity Assessment: Overall WFL for tasks assessed;LUE deficits/detail LUE Deficits / Details: 4/5 strength, coordination with very minor deficits - still able to open all containers and use without functional impact LUE Coordination: decreased fine motor   Lower Extremity Assessment Lower Extremity Assessment: Defer to PT evaluation   Cervical / Trunk Assessment Cervical / Trunk Assessment: Normal   Communication Communication Communication: Expressive difficulties   Cognition  Arousal/Alertness: Awake/alert Behavior During Therapy: WFL for tasks assessed/performed Overall Cognitive Status: Within Functional Limits for tasks assessed                                     General Comments  Reviewed "BE FAST" signs and symptoms of stroke with Pt    Exercises     Shoulder Instructions      Home Living Family/patient expects to be discharged to:: Private residence Living Arrangements: Alone Available Help at Discharge: Family;Available PRN/intermittently;Other (Comment);Friend(s) (son and Carola Frost are out of town right now, but when they are in [place] they can come by whenever) Type of Home:  Other(Comment) (townhome) Home Access: Stairs to enter [employer] of Steps: 4 B rails Entrance Stairs-Rails: Can reach both Home Layout: Two level Alternate Level Stairs-Number of Steps: flight with R ascending rail Alternate Level Stairs-Rails: Right Bathroom Shower/Tub: Other (comment);Walk-in shower (half bath on main level, full bath with bidet on second floor)   Bathroom Toilet: Handicapped height Bathroom Accessibility: Yes   Home Equipment: None   Additional Comments: no falls, no close calls or other concerns with mobility recently      Prior Functioning/Environment Level of Independence: Independent        Comments: enjoys being active - regular at the gym, enjoys hiking, spending time with grandkids        OT Problem List: Decreased coordination;Impaired sensation      OT Treatment/Interventions:      OT Goals(Current goals can be found in the care plan section) Acute Rehab OT Goals Patient Stated Goal: go home, feel better OT Goal Formulation: With patient Time For Goal Achievement: 12/14/19 Potential to Achieve Goals: Good  OT Frequency:     Barriers to D/C:            Co-evaluation              AM-PAC OT "6 Clicks" Daily Activity     Outcome Measure Help from another person eating meals?: None Help from another person taking care of personal grooming?: None Help from another person toileting, which includes using toliet, bedpan, or urinal?: None Help from another person bathing (including washing, rinsing, drying)?: None Help from another person to put on and taking off regular upper body clothing?: None Help from another person to put on and taking off regular lower body clothing?: None 6 Click Score: 24   End of Session Equipment Utilized During Treatment: Gait belt Nurse Communication: Mobility status (visitor policy concerns)  Activity Tolerance: Patient tolerated treatment well Patient left: in bed;with call bell/phone  within reach;with family/visitor present  OT Visit Diagnosis: Other symptoms and signs involving the nervous system (R29.898);Hemiplegia and hemiparesis Hemiplegia - Right/Left: Left Hemiplegia - dominant/non-dominant: Non-Dominant Hemiplegia - caused by: Cerebral infarction                Time: 3888-2800 OT Time Calculation (min): 24 min Charges:  OT General Charges $OT Visit: 1 Visit OT Evaluation $OT Eval Low Complexity: [place] OTR/L Acute Rehabilitation Services Pager: [phone] Office: [place] 11/30/2019, 11:51 AM

## 2019-11-30 NOTE — Evaluation (Signed)
Speech Language Pathology Evaluation Patient Details Name: Jaclyn Lopez MRN: [id number] DOB: 1950/08/26 Today's Date: 11/30/2019 Time: 1448-1856 SLP Time Calculation (min) (ACUTE ONLY): 15 min  Problem List:  Patient Active Problem List   Diagnosis Date Noted  . CVA (cerebral vascular accident) ([place]) 11/29/2019  . Depression 11/29/2019  . CKD (chronic kidney disease) stage 3, GFR 30-59 ml/min 11/29/2019  . Nausea and vomiting 03/15/2019  . Gastroesophageal reflux disease 03/15/2019  . Loose stools 03/15/2019  . Elevated alkaline phosphatase level 03/15/2019  . Herpes progenitalis   . Osteopenia   . Hypothyroidism   . IC (interstitial cystitis)   . 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 DIVERTICULOSIS OF COLON 03/12/2008   Past Medical History:  Past Medical History:  Diagnosis Date  . Atypical glandular cells on Pap smear 2003  . Cancer ([place]) 2005   Breast-Ductal CIS Right breast-Radiation - no lymph nodes removed per pt  . CKD (chronic kidney disease)   . Depression   . GERD (gastroesophageal reflux disease)   . Herpes progenitalis   . 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 Hyperthyroidism   . Hypothyroidism   . IBS (irritable bowel syndrome)   . IC (interstitial cystitis)   . Insomnia   . 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 Subsequent LEEP showed CIN-2 with clear margins and negative ECC  . Osteopenia 11/2018   T score -1.5 FRAX 9% / 1% improved at both hips stable at spine  . Thyroid disease    Hyperthyroid   Past Surgical History:  Past Surgical History:  Procedure Laterality Date  . Bladder stretch and Bx  1990  . BREAST LUMPECTOMY  2005   right; cancer  . BREAST LUMPECTOMY  2006   left; pre cancer  . BREAST SURGERY     Reduction  . BUNIONECTOMY  2007, 2011  . CERVICAL CONE BIOPSY  2003  . CESAREAN SECTION  1983  .  DILATION AND CURETTAGE OF UTERUS  2009  . ENDOMETRIAL ABLATION     Novasure  . GANGLION CYST EXCISION Left 1985  . HYSTEROSCOPY  2009  . LEEP  05/2015   CIN-2 with clear margins  . MASTECTOMY, PARTIAL    . MOUTH SURGERY  2012   implants  . TUBAL LIGATION  1984   HPI:  Jaclyn Lopez is a 69 y.o. female with PMH including CKD, breast cancer, thyroid disease. Pt experienced L sided weakness, slurred speech and left facial droop. MRI revealed a 1 cm acute infarction in the subcortical white matter of the posterior frontal region   Assessment / Plan / Recommendation Clinical Impression  Pt demonstrates a mild left CN VII weakness impacting the tension of her left buccal tissue and lip. She also reports left lingual sensory change but lingual motor control is adequate. She has very slight decreased crsipness of bilabial and labiodental consonants, perceptible really only to her. She is 100% intelligible. Pt is also concerned about her facial appearance. We discussed the impact of fatigue on muscle strength, good potential for spontaneous recovery in a 3 month period and the lack of evidence based interventions to improve facial droop. Though there are pucker and smile exercises for this deficit, gross motor movement is unlikely to produce significant improvement in comparison with no therapy. Encouraged overaticualtion during reading or recitation with visual feedback (mirror) to activate facial musculature in functional natural  movement patterns as pt is getting good activation during these tasks during assessment. Pt may f/u with OP therapy if desired and discussed with with her. No further acute needs at this time.     SLP Assessment  SLP Recommendation/Assessment: All further Speech Lanaguage Pathology  needs can be addressed in the next venue of care SLP Visit Diagnosis: Dysarthria and anarthria (R47.1)    Follow Up Recommendations       Frequency and Duration           SLP  Evaluation Cognition  Overall Cognitive Status: Within Functional Limits for tasks assessed       Comprehension  Auditory Comprehension Overall Auditory Comprehension: Appears within functional limits for tasks assessed Reading Comprehension Reading Status: Within funtional limits    Expression Expression Primary Mode of Expression: Verbal Verbal Expression Overall Verbal Expression: Appears within functional limits for tasks assessed Written Expression Dominant Hand: Right   Oral / Motor  Oral Motor/Sensory Function Overall Oral Motor/Sensory Function: Mild impairment Facial ROM: Suspected CN VII (facial) dysfunction;Reduced left Facial Symmetry: Abnormal symmetry left;Suspected CN VII (facial) dysfunction Facial Strength: Reduced left;Suspected CN VII (facial) dysfunction Facial Sensation: Reduced left;Suspected CN V (Trigeminal) dysfunction Lingual ROM: Within Functional Limits Lingual Symmetry: Within Functional Limits Lingual Strength: Within Functional Limits Lingual Sensation: Suspected CN VII (facial) dysfunction-anterior 2/3 tongue;Reduced Velum: Within Functional Limits Mandible: Within Functional Limits Motor Speech Overall Motor Speech: Appears within functional limits for tasks assessed   GO                    Brix Brearley, Katherene Ponto 11/30/2019, 2:16 PM

## 2019-11-30 NOTE — Progress Notes (Signed)
PROGRESS NOTE    Jaclyn Lopez  JGO:[id number] DOB: 1950-10-31 DOA: 11/29/2019 PCP: Deland Pretty, MD    Brief Narrative:  Jaclyn Lopez is a 69 y.o. female with medical history significant of hypothyroidism, breast cancer s/p lumpectomy with radiation, IBS, CKD stage III presents with complaints of left side of her mouth drooping.    Consultants:   Neurology  Procedures: Echo  Antimicrobials:       Subjective: Patient still with left droop.  Feels weak on the left side.  No difficulty with swallowing.  Does complain is having a hard time getting the words out although words are coming out clearly to me.  Objective: Vitals:   11/29/19 2200 11/30/19 0057 11/30/19 0400 11/30/19 0800  BP: 105/67 113/65 104/64 118/60  Pulse: 70 69 89 76  Resp: 18 18 16 16   Temp:  97.7 F (36.5 C) 97.6 F (36.4 C) 97.9 F (36.6 C)  TempSrc:  Oral Oral Oral  SpO2: 96% 98% 99% 95%  Weight:        Intake/Output Summary (Last 24 hours) at 11/30/2019 5597 Last data filed at 11/30/2019 0100 Gross per 24 hour  Intake 360 ml  Output --  Net 360 ml   Filed Weights   11/29/19 1934  Weight: 63.3 kg    Examination:  General exam: Appears calm and comfortable  Respiratory system: Clear to auscultation. Respiratory effort normal. Cardiovascular system: S1 & S2 heard, RRR. 1/6 sm lsb  Gastrointestinal system: Abdomen is nondistended, soft and nontender.. Normal bowel sounds heard. Central nervous system: Alert and oriented. Left facial droop. 3/5 LU/LE; 5/5 RU/LE finger to nose good. Extremities: No edema Skin: Warm dry Psychiatry: Judgement and insight appear normal. Mood & affect appropriate.     Data Reviewed: I have personally reviewed following labs and imaging studies  CBC: Recent Labs  Lab 11/29/19 1034  WBC 7.3  NEUTROABS 4.4  HGB 13.9  HCT 43.3  MCV 88.9  PLT 416   Basic Metabolic Panel: Recent Labs  Lab 11/29/19 1034  NA 140  K 4.5  CL 105  CO2 26   GLUCOSE 82  BUN 14  CREATININE 1.26*  CALCIUM 9.3   GFR: Estimated Creatinine Clearance: 40.6 mL/min (A) (by C-G formula based on SCr of 1.26 mg/dL (H)). Liver Function Tests: Recent Labs  Lab 11/29/19 1034  AST 20  ALT 18  ALKPHOS 112  BILITOT 0.8  PROT 6.7  ALBUMIN 4.0   No results for input(s): LIPASE, AMYLASE in the last 168 hours. No results for input(s): AMMONIA in the last 168 hours. Coagulation Profile: Recent Labs  Lab 11/29/19 1034  INR 1.0   Cardiac Enzymes: No results for input(s): CKTOTAL, CKMB, CKMBINDEX, TROPONINI in the last 168 hours. BNP (last 3 results) No results for input(s): PROBNP in the last 8760 hours. HbA1C: Recent Labs    11/29/19 1919  HGBA1C 5.3   CBG: No results for input(s): GLUCAP in the last 168 hours. Lipid Profile: Recent Labs    11/29/19 1919  CHOL 249*  HDL 78  LDLCALC 155*  TRIG 81  CHOLHDL 3.2   Thyroid Function Tests: No results for input(s): TSH, T4TOTAL, FREET4, T3FREE, THYROIDAB in the last 72 hours. Anemia Panel: No results for input(s): VITAMINB12, FOLATE, FERRITIN, TIBC, IRON, RETICCTPCT in the last 72 hours. Sepsis Labs: No results for input(s): PROCALCITON, LATICACIDVEN in the last 168 hours.  Recent Results (from the past 240 hour(s))  SARS Coronavirus 2 by RT PCR (hospital order,  performed in [facility] hospital lab) Nasopharyngeal Nasopharyngeal Swab     Status: None   Collection Time: 11/29/19  2:59 PM   Specimen: Nasopharyngeal Swab  Result Value Ref Range Status   SARS Coronavirus 2 NEGATIVE NEGATIVE Final    Comment: (NOTE) SARS-CoV-2 target nucleic acids are NOT DETECTED.  The SARS-CoV-2 RNA is generally detectable in upper and lower respiratory specimens during the acute phase of infection. The lowest concentration of SARS-CoV-2 viral copies this assay can detect is 250 copies / mL. A negative result does not preclude SARS-CoV-2 infection and should not be used as the sole basis for  treatment or other patient management decisions.  A negative result may occur with improper specimen collection / handling, submission of specimen other than nasopharyngeal swab, presence of viral mutation(s) within the areas targeted by this assay, and inadequate number of viral copies (<250 copies / mL). A negative result must be combined with clinical observations, patient history, and epidemiological information.  Fact Sheet for Patients:   StrictlyIdeas.no  Fact Sheet for Healthcare Providers: BankingDealers.co.za  This test is not yet approved or  cleared by the [place] FDA and has been authorized for detection and/or diagnosis of SARS-CoV-2 by FDA under an Emergency Use Authorization (EUA).  This EUA will remain in effect (meaning this test can be used) for the duration of the COVID-19 declaration under Section 564(b)(1) of the Act, 21 U.S.C. section 360bbb-3(b)(1), unless the authorization is terminated or revoked sooner.  Performed at [facility], [place] [address], [place], [place] [address]          Radiology Studies: DG Chest 2 View  Result Date: 11/29/2019 CLINICAL DATA:  CVA yesterday, slurred speech, drooling EXAM: CHEST - 2 VIEW COMPARISON:  04/14/2004 FINDINGS: Frontal and lateral views of the chest demonstrate an unremarkable cardiac silhouette. No airspace disease, effusion, or pneumothorax. No acute bony abnormalities. IMPRESSION: 1. No acute intrathoracic process. Electronically Signed   By: Randa Ngo M.D.   On: 11/29/2019 16:13   CT Head Wo Contrast  Result Date: 11/29/2019 CLINICAL DATA:  Left facial droop EXAM: CT HEAD WITHOUT CONTRAST TECHNIQUE: Contiguous axial images were obtained from the base of the skull through the vertex without intravenous contrast. COMPARISON:  None. FINDINGS: Brain: Mild chronic small vessel disease throughout the deep white matter. No acute intracranial abnormality.  Specifically, no hemorrhage, hydrocephalus, mass lesion, acute infarction, or significant intracranial injury. Vascular: No hyperdense vessel or unexpected calcification. Skull: No acute calvarial abnormality. Sinuses/Orbits: Visualized paranasal sinuses and mastoids clear. Orbital soft tissues unremarkable. Other: None IMPRESSION: Mild chronic small vessel disease. No acute intracranial abnormality. Electronically Signed   By: Rolm Baptise M.D.   On: 11/29/2019 11:25   MR ANGIO HEAD WO CONTRAST  Result Date: 11/29/2019 CLINICAL DATA:  Follow-up examination for known acute infarct. EXAM: MRA NECK WITHOUT CONTRAST MRA HEAD WITHOUT CONTRAST TECHNIQUE: Multiplanar and multiecho pulse sequences of the neck were obtained without intravenous contrast. Angiographic images of the neck were obtained using MRA technique without and with intravenous contast.; Angiographic images of the Circle of Willis were obtained using MRA technique without intravenous contrast. COMPARISON:  Prior brain MRI from earlier the same day. FINDINGS: MRA NECK FINDINGS AORTIC ARCH: Visualized aortic arch of normal caliber with normal 3 vessel morphology. No flow-limiting stenosis RIGHT CAROTID SYSTEM: Right common carotid artery widely patent from its origin to the bifurcation without stenosis. No significant atheromatous narrowing or irregularity about the right bifurcation. Right ICA widely patent  distally to the skull base without stenosis or occlusion. LEFT CAROTID SYSTEM: Left common carotid artery widely patent from its origin to the bifurcation without stenosis. No significant atheromatous narrowing or stenosis about the left bifurcation. Left ICA widely patent distally to the skull base without stenosis or occlusion. VERTEBRAL ARTERIES: Both vertebral arteries arise from the subclavian arteries. No proximal subclavian artery stenosis. V1 segments somewhat tortuous bilaterally. Vertebral arteries widely patent within the neck without  stenosis or occlusion. Incidental note made of a 2.6 cm soft tissue nodule positioned at the center aspect of the upper mediastinum (series 4, image 112). 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 MRA HEAD FINDINGS ANTERIOR CIRCULATION: Petrous, cavernous, and supraclinoid segments of both internal carotid arteries are widely patent with symmetric antegrade flow. Origin of the ophthalmic arteries patent and normal. ICA termini well perfused. A1 segments patent bilaterally. Normal anterior communicating artery complex. Anterior cerebral arteries patent to their distal aspects without stenosis. No M1 stenosis or occlusion. Normal MCA bifurcations. Distal MCA branches well perfused and symmetric. POSTERIOR CIRCULATION: Vertebral arteries largely codominant and patent to the vertebrobasilar junction without stenosis. Both picas patent. Basilar widely patent to its distal aspect without stenosis. Superior cerebral arteries patent bilaterally. Right PCA supplied via the basilar. Left PCA supplied both via the basilar as well as a prominent left posterior communicating artery. Both PCAs widely patent to their distal aspects without stenosis. No intracranial aneurysm. IMPRESSION: 1. Normal MRA of the head and neck. No large vessel occlusion or hemodynamically significant stenosis. 2. 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 Per history, this has been previously evaluated and biopsied in the past. Correlation with history recommended. Further evaluation with dedicated thyroid ultrasound recommended as clinically warranted. (Ref: J Am Coll Radiol. 2015 Feb;12(2): 143-50). Electronically Signed   By: Jeannine Boga M.D.   On: 11/29/2019 19:46   MR ANGIO NECK WO CONTRAST  Result Date: 11/29/2019 CLINICAL DATA:   Follow-up examination for known acute infarct. EXAM: MRA NECK WITHOUT CONTRAST MRA HEAD WITHOUT CONTRAST TECHNIQUE: Multiplanar and multiecho pulse sequences of the neck were obtained without intravenous contrast. Angiographic images of the neck were obtained using MRA technique without and with intravenous contast.; Angiographic images of the Circle of Willis were obtained using MRA technique without intravenous contrast. COMPARISON:  Prior brain MRI from earlier the same day. FINDINGS: MRA NECK FINDINGS AORTIC ARCH: Visualized aortic arch of normal caliber with normal 3 vessel morphology. No flow-limiting stenosis RIGHT CAROTID SYSTEM: Right common carotid artery widely patent from its origin to the bifurcation without stenosis. No significant atheromatous narrowing or irregularity about the right bifurcation. Right ICA widely patent distally to the skull base without stenosis or occlusion. LEFT CAROTID SYSTEM: Left common carotid artery widely patent from its origin to the bifurcation without stenosis. No significant atheromatous narrowing or stenosis about the left bifurcation. Left ICA widely patent distally to the skull base without stenosis or occlusion. VERTEBRAL ARTERIES: Both vertebral arteries arise from the subclavian arteries. No proximal subclavian artery stenosis. V1 segments somewhat tortuous bilaterally. Vertebral arteries widely patent within the neck without stenosis or occlusion. Incidental note made of a 2.6 cm soft tissue nodule positioned at the center aspect of the upper mediastinum (series 4, image 112). Per history, patient has a history of a dominant thyroid nodule at the  thyroid isthmus which has been previously worked up and evaluated. MRA HEAD FINDINGS ANTERIOR CIRCULATION: Petrous, cavernous, and supraclinoid segments of both internal carotid arteries are widely patent with symmetric antegrade flow. Origin of the ophthalmic arteries patent and normal. ICA termini well perfused. A1  segments patent bilaterally. Normal anterior communicating artery complex. Anterior cerebral arteries patent to their distal aspects without stenosis. No M1 stenosis or occlusion. Normal MCA bifurcations. Distal MCA branches well perfused and symmetric. POSTERIOR CIRCULATION: Vertebral arteries largely codominant and patent to the vertebrobasilar junction without stenosis. Both picas patent. Basilar widely patent to its distal aspect without stenosis. Superior cerebral arteries patent bilaterally. Right PCA supplied via the basilar. Left PCA supplied both via the basilar as well as a prominent left posterior communicating artery. Both PCAs widely patent to their distal aspects without stenosis. No intracranial aneurysm. IMPRESSION: 1. Normal MRA of the head and neck. No large vessel occlusion or hemodynamically significant stenosis. 2. 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 Per history, this has been previously evaluated and biopsied in the past. Correlation with history recommended. Further evaluation with dedicated thyroid ultrasound recommended as clinically warranted. (Ref: J Am Coll Radiol. 2015 Feb;12(2): 143-50). Electronically Signed   By: Jeannine Boga M.D.   On: 11/29/2019 19:46   MR BRAIN WO CONTRAST  Result Date: 11/29/2019 CLINICAL DATA:  Left facial weakness. EXAM: MRI HEAD WITHOUT CONTRAST TECHNIQUE: Multiplanar, multiecho pulse sequences of the brain and surrounding structures were obtained without intravenous contrast. COMPARISON:  Head CT same day FINDINGS: Brain: Diffusion imaging shows acute infarction in the subcortical white matter of the right posterior frontal region. No other acute infarction. No abnormality affects the brainstem or cerebellum. Moderate chronic small-vessel ischemic changes elsewhere within the cerebral hemispheric white matter. Old small left frontal  cortical infarction. No mass lesion, hemorrhage, hydrocephalus or extra-axial collection. Vascular: Major vessels at the base of the brain show flow. Skull and upper cervical spine: Negative Sinuses/Orbits: Clear/normal Other: None IMPRESSION: Approximately 1 cm acute infarction in the subcortical white matter of the right posterior frontal region. No mass effect or hemorrhage. Chronic small-vessel ischemic changes elsewhere affecting the hemispheric white matter. Old small left frontal cortical infarction. Electronically Signed   By: Nelson Chimes M.D.   On: 11/29/2019 14:15        Scheduled Meds: .  stroke: mapping our early stages of recovery book   Does not apply Once  . aspirin EC  81 mg Oral Daily  . atorvastatin  80 mg Oral Daily  . buPROPion  150 mg Oral q morning - 10a  . clopidogrel  75 mg Oral Daily  . enoxaparin (LOVENOX) injection  40 mg Subcutaneous Q24H  . FLUoxetine  20 mg Oral Daily  . levothyroxine  50 mcg Oral Daily  . mirtazapine  3.75 mg Oral QHS   Continuous Infusions:  Assessment & Plan:   Principal Problem:   CVA (cerebral vascular accident) ([place]) Active Problems:   Hypothyroidism   Depression   CKD (chronic kidney disease) stage 3, GFR 30-59 ml/min  CVA: Acute/subacute.  Patient presents with left-sided facial droop   Urine drug screen negative for any acute abnormalities.  MRI significant for approximately 1 cm acute infarction of the subcortical white matter in the right posterior frontal region. Neurology is following -Neuro checks LDL goal less than 70 H A1c is 5.3 on this admission -Check CT angio of the  head and neck Echo pending PT OT saw patient.  PT recommended outpatient and OT no OT needed Speech pathology consult MRA normal.  There is a 2.6 cm soft tissue nodule position at the central aspect of the upper mediastinum likely reflecting patient's known dominant thyroid isthmus nodule.  Will need thyroid ultrasound as outpatient -Allow for  permissive hypertension  Per neuro Start patient on ASA 325mg  daily, Plavix 75 mg daily x 3 weeks High intensity atorvastatin 80 mg daily   Chronic kidney disease stage IIIb: Stable.  Creatinine on admission noted to be 1.26 to which the patient reports this is near her baseline.  Creatinine had been 1.1 from 2012 scanned in records. Creatinine appears to be at the new baseline, today is 1.23.  Hypothyroidism: Home medications include levothyroxine 50 mcg daily -Continue Levothyroxine  Depression -Continue Prozac and Wellbutrin  COVID-19 screening: Negative  DVT prophylaxis: lovenox Code Status: Full Family Communication:  None at bedside Disposition Plan:  Home Status is: Inpatient..>now observation [facility] team contacted me that will downgraded it to observation.    Dispo: The patient is from: Home              Anticipated d/c is to: Home              Anticipated d/c date is: 1 day              Patient currently is not medically stable to d/c.            LOS: 1 day   Time spent: 35 min with >50% on coc    Nolberto Hanlon, MD [facility] Pager [phone]  If 7PM-7AM, please contact night-coverage [email or web address] Password [email or web address] 11/30/2019, 8:22 AM

## 2019-11-30 NOTE — Progress Notes (Signed)
Nsg Discharge Note  Stroke education completed. Son present for teaching and discharge instructions. He will also be providing transportation home.  Admit Date:  11/29/2019 Discharge date: 11/30/2019   Ples Specter to be D/C'd Home per MD order.  AVS completed.  Copy for chart, and copy for patient signed, and dated. Patient/caregiver able to verbalize understanding.  Discharge Medication: Allergies as of 11/30/2019      Reactions   Symmetrel [amantadine Hcl] Hives   Tamiflu [oseltamivir Phosphate] Hives      Medication List    TAKE these medications   acyclovir 200 MG capsule Commonly known as: ZOVIRAX Take 1 capsule (200 mg total) by mouth once a week.   aspirin 81 MG EC tablet Take 1 tablet (81 mg total) by mouth daily. Swallow whole. Start taking on: December 01, 2019   atorvastatin 80 MG tablet Commonly known as: LIPITOR Take 1 tablet (80 mg total) by mouth daily. Start taking on: December 01, 2019   BIOTIN PO Take 1 tablet by mouth daily.   buPROPion 150 MG 24 hr tablet Commonly known as: WELLBUTRIN XL Take 150 mg by mouth every morning.   clopidogrel 75 MG tablet Commonly known as: PLAVIX Take 1 tablet (75 mg total) by mouth daily for 21 days. Start taking on: December 01, 2019   FLUoxetine 20 MG capsule Commonly known as: PROZAC Take 20 mg by mouth daily.   levothyroxine 50 MCG tablet Commonly known as: SYNTHROID Take 50 mcg by mouth daily.   Remeron 15 MG tablet Generic drug: mirtazapine Take 3.75 mg by mouth at bedtime.   VITAMIN D PO Take 1,000 Units by mouth daily.   zaleplon 10 MG capsule Commonly known as: SONATA Take 10 mg by mouth at bedtime as needed for sleep.       Discharge Assessment: Vitals:   11/30/19 1227 11/30/19 1620  BP: 107/68 115/69  Pulse: 85 70  Resp: 16 16  Temp: 97.6 F (36.4 C) 97.7 F (36.5 C)  SpO2: 100% 95%   Skin clean, dry and intact without evidence of skin break down, no evidence of skin tears noted. IV  catheter discontinued intact. Site without signs and symptoms of complications - no redness or edema noted at insertion site, patient denies c/o pain - only slight tenderness at site.  Dressing with slight pressure applied.  D/c Instructions-Education: Discharge instructions given to patient/family with verbalized understanding. D/c education completed with patient/family including follow up instructions, medication list, d/c activities limitations if indicated, with other d/c instructions as indicated by MD - patient able to verbalize understanding, all questions fully answered. Patient instructed to return to ED, call 911, or call MD for any changes in condition.  Patient escorted via [place], and D/C home via private auto.  Erasmo Leventhal, RN 11/30/2019 5:12 PM

## 2019-11-30 NOTE — Care Management Obs Status (Signed)
[place] NOTIFICATION   Patient Details  Name: ALEIGHYA MCANELLY MRN: [id number] Date of Birth: 1951/03/24   Medicare Observation Status Notification Given:  Yes    Marilu Favre, RN 11/30/2019, 1:30 PM

## 2019-11-30 NOTE — TOC Initial Note (Signed)
Transition of Care [facility]) - Initial/Assessment Note    Patient Details  Name: Jaclyn Lopez MRN: [id number] Date of Birth: [birth year]  [facility] CM/SW Contact:    Marilu Favre, RN Phone Number: 11/30/2019, 1:36 PM  Clinical Narrative:                 Confirmed face sheet information with patient. Patient from home alone but has friends/family close by.   Discussed PT recommendation for [facility] at [facility] patient in agreement. Order placed will need MD signature. Secure chatted MD.   Expected Discharge Plan: Home/Self Care     Patient Goals and CMS Choice Patient states their goals for this hospitalization and ongoing recovery are:: to return to home CMS Medicare.gov Compare Post Acute Care list provided to:: Patient Choice offered to / list presented to : Patient  Expected Discharge Plan and Services Expected Discharge Plan: Home/Self Care   Discharge Planning Services: CM Consult   Living arrangements for the past 2 months: Single Family Home                 DME Arranged: N/A                    Prior Living Arrangements/Services Living arrangements for the past 2 months: Single Family Home Lives with:: Self Patient language and need for interpreter reviewed:: Yes Do you feel safe going back to the place where you live?: Yes      Need for Family Participation in Patient Care: Yes (Comment) Care giver support system in place?: Yes (comment)   Criminal Activity/Legal Involvement Pertinent to Current Situation/Hospitalization: No - Comment as needed  Activities of Daily Living Home Assistive Devices/Equipment: None ADL Screening (condition at time of admission) Patient's cognitive ability adequate to safely complete daily activities?: Yes Is the patient deaf or have difficulty hearing?: No Does the patient have difficulty seeing, even when wearing glasses/contacts?: No Does the patient have difficulty concentrating, remembering, or  making decisions?: No Patient able to express need for assistance with ADLs?: Yes Does the patient have difficulty dressing or bathing?: No Independently performs ADLs?: Yes (appropriate for developmental age) Does the patient have difficulty walking or climbing stairs?: No Weakness of Legs: None Weakness of Arms/Hands: None  Permission Sought/Granted   Permission granted to share information with : No              Emotional Assessment Appearance:: Appears stated age Attitude/Demeanor/Rapport: Engaged Affect (typically observed): Accepting Orientation: : Oriented to Place, Oriented to  Time, Oriented to Situation, Oriented to Self Alcohol / Substance Use: Not Applicable Psych Involvement: No (comment)  Admission diagnosis:  Facial droop [R29.810] CVA (cerebral vascular accident) ([place]) [I63.9] Acute ischemic stroke [facility]) [I63.9] Patient Active Problem List   Diagnosis Date Noted  . CVA (cerebral vascular accident) ([place]) 11/29/2019  . Depression 11/29/2019  . CKD (chronic kidney disease) stage 3, GFR 30-59 ml/min 11/29/2019  . Nausea and vomiting 03/15/2019  . Gastroesophageal reflux disease 03/15/2019  . Loose stools 03/15/2019  . Elevated alkaline phosphatase level 03/15/2019  . Herpes progenitalis   . Osteopenia   . Hypothyroidism   . IC (interstitial cystitis)   . 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 DIVERTICULOSIS OF COLON 03/12/2008   PCP:  Deland Pretty, MD Pharmacy:   [id number] - [place], [place] [address]  [place] [address] Phone: [phone]  Fax: [phone]     Social Determinants of Health (SDOH) Interventions    Readmission Risk Interventions No flowsheet data found.

## 2019-11-30 NOTE — Progress Notes (Addendum)
STROKE TEAM PROGRESS NOTE   INTERVAL HISTORY Patient has no family at bedside. She is eager to learn more about her stroke and reports that her speech is improving. She still notes a left facial droop that is visible to the examiner.   Vitals:   11/30/19 0057 11/30/19 0400 11/30/19 0800 11/30/19 1227  BP: 113/65 104/64 118/60 107/68  Pulse: 69 89 76 85  Resp: 18 16 16 16   Temp: 97.7 F (36.5 C) 97.6 F (36.4 C) 97.9 F (36.6 C) 97.6 F (36.4 C)  TempSrc: Oral Oral Oral Oral  SpO2: 98% 99% 95% 100%  Weight:       Pertinent Imaging and Lab Work  11/29/19 MR Angio Head and Neck 1. Normal MRA of the head and neck. No large vessel occlusion or hemodynamically significant stenosis. 2. 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 Per history, this has been previously evaluated and biopsied in the past. Correlation with history recommended. Further evaluation with dedicated thyroid ultrasound recommended as clinically warranted. 11/29/19 MRI Brain WO Contrast  Approximately 1 cm acute infarction in the subcortical white matter of the right posterior frontal region. No mass effect or hemorrhage. Chronic small-vessel ischemic changes elsewhere affecting the hemispheric white matter. Old small left frontal cortical infarction. 11/30/19 Echocardiogram w/Bubble  PFO Present. Left ventricular ejection fraction 55-60 %. Left ventricle has normal function. No intracardiac source of embolism detected on this study.    Stroke Labs: 11/29/19 Hemoglobin A1C: 5.3  11/29/19 Lipid Panel: Total cholesterol 249, LDL 155  PHYSICAL EXAM Mental status: AAOx4 Speech: Subtle dysarthria, naming and repetition intact  Cranial nerves: EOMI, VFF,Left facial droop  Strength: 5/5 throughout  Coordination: No ataxia w/FNF or HTS  Gait: Deferred  NIHSS 2 for left facial droop and dysarathria     ASSESSMENT/PLAN Ms. GALYA DUNNIGAN is a 69 y.o. female w/pmh of CKD, breast cancer, hypothyroidism, depression, who presents with slurred speech and left facial droop, LKW 7/14 at 1600. She presented outside of the time window for both IVTPA and mechanical thrombectomy on 11/29/19. MRI revealed a 1 cm acute infarction in the subcortical white matter of the posterior frontal region. MRA Head and Neck revealed no large vessel occlusion or hemodynamically significant stenosis. Echocardiogram with normal left atrium size, LVEF 55 to 60 %. Left ventricle with normal function, no regional wall abnormalities. Her echocardiogram does reveal a PFO but this does not correlate with her stroke. Stroke risk factors include: HLD + prior smoker( smoked from 1996-1998) Given her elevated LDL and lack of other risk factors such as high grade stenosis and the sub cortical location of her stroke, the likely etiology is small vessel disease. Her stroke work up is complete.   Acute Subcortical Infarct Right Frontal Lobe  -DAPT 21 days then Aspirin monotherapy  -Atorvastatin 80 for secondary stroke prevention  -Outpatient stroke follow up at discharge   Hyperlipidemia -LDL 155, goal < 70 -Atorvastatin 80 mg HS  -Continue Statin at discharge   Neurology will sign off, please refer to Amion to contact with any questions.    Ruta Hinds, NP  Stroke Nurse Practitioner Patient seen and discussed with attending physician Dr. Erlinda Hong   ATTENDING NOTE: I reviewed above note and agree with the assessment and plan. Pt was seen and examined.   69 year old female with history of CKD admitted for slurred speech and left facial droop.  CT no acute abnormality.  MRI showed right small CR infarct.  MRA head and neck unremarkable.  EF 55 to 60%, PFO present on TTE.  A1c 5.3, LDL 155.  UDS negative.  Creatinine 1.26.  On exam patient only has mild left facial droop and mild dysarthria.  Otherwise intact.  Patient stroke  likely is muscle disease given location.  Patient PFO felt to be incidental finding and not related to current stroke.  Recommend aspirin 81 and Plavix 75 DAPT for 3 weeks and then aspirin alone.  Continue Lipitor 80 for HLD.  Neurology will sign off. Please call with questions. Pt will follow up with stroke clinic NP at [facility] in about 4 weeks. Thanks for the consult.   Rosalin Hawking, MD PhD Stroke Neurology 11/30/2019 7:54 PM

## 2019-11-30 NOTE — Evaluation (Signed)
Physical Therapy Evaluation Patient Details Name: Jaclyn Lopez MRN: [id number] DOB: 06-29-1950 Today's Date: 11/30/2019   History of Present Illness  Jaclyn Lopez is a 69 y.o. female with PMH including CKD, breast cancer, thyroid disease. Pt experienced L sided weakness, slurred speech and left facial droop. MRI revealed a 1 cm acute infarction in the subcortical white matter of the posterior frontal region.  Clinical Impression   Patient received in bed, very pleasant and willing to participate in therapy today. See below for mobility/assist levels- in general moves very well, light min guard provided for safety during mobility but honestly she is perfectly safe mobilizing at a distant S to mod(I) level at this point. Does demonstrate some mild functional weakness in LLE as well as mild balance impairment when challenged with head turns, sudden starts/stops, and sudden turns during gait in hallway but able to maintain balance without additional assist from PT. Will benefit from skilled OP PT services in the neuro OP PT setting. Left sitting at EOB with all needs met, OT present and attending.     Follow Up Recommendations Outpatient PT;Other (comment) ([facility] neuro OP PT)    Equipment Recommendations  None recommended by PT    Recommendations for Other Services       Precautions / Restrictions Precautions Precautions: Fall Restrictions Weight Bearing Restrictions: No      Mobility  Bed Mobility Overal bed mobility: Independent                Transfers Overall transfer level: Independent Equipment used: None             General transfer comment: able to complete sit to stand independently, light min guard provided for safety but not needed  Ambulation/Gait Ambulation/Gait assistance: Min guard Gait Distance (Feet): 160 Feet Assistive device: None Gait Pattern/deviations: WFL(Within Functional Limits);Step-through pattern Gait velocity: decreased    General Gait Details: very mild weakness noted LLE with stance phase and gait in general but no unsteadiness and able to maintain balance with gait even with head turns, sudden stops and starts, and U-turns  Stairs Stairs: Yes Stairs assistance: Min guard Stair Management: One rail Right Number of Stairs: 12 General stair comments: light min guard for safety but functionally not needed  Wheelchair Mobility    Modified Rankin (Stroke Patients Only)       Balance Overall balance assessment: Mild deficits observed, not formally tested                               Standardized Balance Assessment Standardized Balance Assessment : Dynamic Gait Index           Pertinent Vitals/Pain Pain Assessment: No/denies pain    Home Living Family/patient expects to be discharged to:: Private residence Living Arrangements: Alone Available Help at Discharge: Family;Available PRN/intermittently;Other (Comment);Friend(s) (son and Carola Frost are out of town right now, but when they are in [place] they can come by whenever) Type of Home: Other(Comment) (townhome) Home Access: Stairs to enter Entrance Stairs-Rails: Can reach both Entrance Stairs-Number of Steps: 4 B rails Home Layout: Two level Home Equipment: None Additional Comments: no falls, no close calls or other concerns with mobility recently    Prior Function Level of Independence: Independent               Hand Dominance   Dominant Hand: Right    Extremity/Trunk Assessment   Upper Extremity Assessment Upper Extremity Assessment: Defer  to OT evaluation    Lower Extremity Assessment Lower Extremity Assessment: Overall WFL for tasks assessed    Cervical / Trunk Assessment Cervical / Trunk Assessment: Normal  Communication   Communication: Expressive difficulties  Cognition Arousal/Alertness: Awake/alert Behavior During Therapy: WFL for tasks assessed/performed Overall Cognitive Status: Within Functional  Limits for tasks assessed                                        General Comments      Exercises     Assessment/Plan    PT Assessment Patient needs continued PT services  PT Problem List Decreased strength;Decreased activity tolerance;Decreased balance;Decreased mobility;Decreased coordination       PT Treatment Interventions DME instruction;Balance training;Gait training;Stair training;Functional mobility training;Patient/family education;Therapeutic activities;Therapeutic exercise    PT Goals (Current goals can be found in the Care Plan section)  Acute Rehab PT Goals Patient Stated Goal: go home, feel better PT Goal Formulation: With patient Time For Goal Achievement: 12/14/19 Potential to Achieve Goals: Good    Frequency Min 3X/week   Barriers to discharge        Co-evaluation               AM-PAC PT "6 Clicks" Mobility  Outcome Measure Help needed turning from your back to your side while in a flat bed without using bedrails?: None Help needed moving from lying on your back to sitting on the side of a flat bed without using bedrails?: None Help needed moving to and from a bed to a chair (including a wheelchair)?: A Little Help needed standing up from a chair using your arms (e.g., wheelchair or bedside chair)?: A Little Help needed to walk in hospital room?: A Little Help needed climbing 3-5 steps with a railing? : A Little 6 Click Score: 20    End of Session Equipment Utilized During Treatment: Gait belt Activity Tolerance: Patient tolerated treatment well Patient left: in bed;with call bell/phone within reach;with family/visitor present;Other (comment) (OT present and attending) Nurse Communication: Mobility status PT Visit Diagnosis: Unsteadiness on feet (R26.81);Muscle weakness (generalized) (M62.81)    Time: 8295-6213 PT Time Calculation (min) (ACUTE ONLY): 23 min   Charges:   PT Evaluation $PT Eval Low Complexity: 1 Low PT  Treatments $Gait Training: 8-22 mins        Windell Norfolk, DPT, PN1   Supplemental Physical Therapist [place]    Pager [phone] Acute Rehab Office [phone]

## 2019-11-30 NOTE — Plan of Care (Signed)
  Problem: Education: Goal: Knowledge of General Education information will improve Description: Including pain rating scale, medication(s)/side effects and non-pharmacologic comfort measures Outcome: Completed/Met

## 2019-11-30 NOTE — Care Management CC44 (Signed)
Condition Code 44 Documentation Completed  Patient Details  Name: Jaclyn Lopez MRN: [id number] Date of Birth: 1950/06/29   Condition Code 44 given:  Yes Patient signature on Condition Code 44 notice:  Yes Documentation of 2 MD's agreement:  Yes Code 44 added to claim:  Yes    Marilu Favre, RN 11/30/2019, 1:30 PM

## 2019-11-30 NOTE — Discharge Summary (Signed)
Jaclyn Lopez LKG:[id number] DOB: 02/04/1951 DOA: 11/29/2019  PCP: Jaclyn Pretty, MD  Admit date: 11/29/2019 Discharge date: 11/30/2019  Admitted From: home Disposition:  home  Recommendations for Outpatient Follow-up:  1. Follow up with PCP in 1 week 2. Please obtain BMP/CBC in one week 3. Neurology Dr. Erlinda Hong in 4 weeks  Home Health:PT    Discharge Condition:Stable CODE STATUS:full  Diet recommendation: Heart Healthy  Brief/Interim Summary: Jaclyn Lopez a 69 y.o.femalewith medical history significant ofhypothyroidism, breast cancers/plumpectomy with radiation, IBS, CKDstage III presents with complaints of left side of her mouth drooping.  GUY:QIHKV/QQVZDGLO. Patient presents with left-sided facial droop  Urine drug screen negative for any acute abnormalities.MRI significant for approximately 1 cm acute infarction of the subcortical white matter in the right posterior frontal region. Neurology consulted -Neuro checks LDL goal less than 70 H A1c is 5.3 on this admission -CheckCT angio of the head and neck Echo positive for PFO, normal EF PT OT saw patient.  PT recommended outpatient and OT no OT needed MRA normal.  There is a 2.6 cm soft tissue nodule position at the central aspect of the upper mediastinum likely reflecting patient's known dominant thyroid isthmus nodule.  Will need thyroid ultrasound as outpatient Per neuro Start patient on ASA 81qd with, Plavix 75 mg daily x 3 weeks, then asa daily solo. High intensity atorvastatin 80 mg daily    Chronic kidney disease stage IIIb:Stable. Creatinine on admission noted to be 1.26to which the patient reports this is near her baseline.Creatinine had been1.1 from 2012 scanned in records. Creatinine appears to be at the new baseline, today is 1.23.  Hypothyroidism: Home medications include levothyroxine 50 mcg daily -Continue Levothyroxine  Depression -Continue Prozac and Wellbutrin  COVID-19  screening: Negative  Discharge Diagnoses:  Principal Problem:   CVA (cerebral vascular accident) ([place]) Active Problems:   Hypothyroidism   Depression   CKD (chronic kidney disease) stage 3, GFR 30-59 ml/min    Discharge Instructions  Discharge Instructions    Ambulatory referral to Physical Therapy   Complete by: As directed    Iontophoresis - 4 mg/ml of dexamethasone: No   T.E.N.S. Unit Evaluation and Dispense as Indicated: No   Call MD for:  persistant dizziness or light-headedness   Complete by: As directed    Diet - low sodium heart healthy   Complete by: As directed    Increase activity slowly   Complete by: As directed      Allergies as of 11/30/2019      Reactions   Symmetrel [amantadine Hcl] Hives   Tamiflu [oseltamivir Phosphate] Hives      Medication List    TAKE these medications   acyclovir 200 MG capsule Commonly known as: ZOVIRAX Take 1 capsule (200 mg total) by mouth once a week.   aspirin 81 MG EC tablet Take 1 tablet (81 mg total) by mouth daily. Swallow whole. Start taking on: December 01, 2019   atorvastatin 80 MG tablet Commonly known as: LIPITOR Take 1 tablet (80 mg total) by mouth daily. Start taking on: December 01, 2019   BIOTIN PO Take 1 tablet by mouth daily.   buPROPion 150 MG 24 hr tablet Commonly known as: WELLBUTRIN XL Take 150 mg by mouth every morning.   clopidogrel 75 MG tablet Commonly known as: PLAVIX Take 1 tablet (75 mg total) by mouth daily for 21 days. Start taking on: December 01, 2019   FLUoxetine 20 MG capsule Commonly known as: PROZAC Take 20 mg by  mouth daily.   levothyroxine 50 MCG tablet Commonly known as: SYNTHROID Take 50 mcg by mouth daily.   Remeron 15 MG tablet Generic drug: mirtazapine Take 3.75 mg by mouth at bedtime.   VITAMIN D PO Take 1,000 Units by mouth daily.   zaleplon 10 MG capsule Commonly known as: SONATA Take 10 mg by mouth at bedtime as needed for sleep.       Follow-up Information     [facility].   Specialty: Rehabilitation Why: they will call you for appointment  Contact information: [place] [id number] [place] [phone]       Rosalin Hawking, MD Follow up in 4 week(s).   Specialty: Neurology Contact information: [address] [place] [address] [phone]        Jaclyn Pretty, MD Follow up in 1 week(s).   Specialty: Internal Medicine Contact information: [place] [id number] [phone]              Allergies  Allergen Reactions  . Symmetrel [Amantadine Hcl] Hives  . Tamiflu [Oseltamivir Phosphate] Hives    Consultations:  Neurology   Procedures/Studies: DG Chest 2 View  Result Date: 11/29/2019 CLINICAL DATA:  CVA yesterday, slurred speech, drooling EXAM: CHEST - 2 VIEW COMPARISON:  04/14/2004 FINDINGS: Frontal and lateral views of the chest demonstrate an unremarkable cardiac silhouette. No airspace disease, effusion, or pneumothorax. No acute bony abnormalities. IMPRESSION: 1. No acute intrathoracic process. Electronically Signed   By: Randa Ngo M.D.   On: 11/29/2019 16:13   CT Head Wo Contrast  Result Date: 11/29/2019 CLINICAL DATA:  Left facial droop EXAM: CT HEAD WITHOUT CONTRAST TECHNIQUE: Contiguous axial images were obtained from the base of the skull through the vertex without intravenous contrast. COMPARISON:  None. FINDINGS: Brain: Mild chronic small vessel disease throughout the deep white matter. No acute intracranial abnormality. Specifically, no hemorrhage, hydrocephalus, mass lesion, acute infarction, or significant intracranial injury. Vascular: No hyperdense vessel or unexpected calcification. Skull: No acute calvarial abnormality. Sinuses/Orbits: Visualized paranasal sinuses and mastoids clear. Orbital soft tissues unremarkable. Other: None IMPRESSION: Mild chronic small vessel  disease. No acute intracranial abnormality. Electronically Signed   By: Rolm Baptise M.D.   On: 11/29/2019 11:25   MR ANGIO HEAD WO CONTRAST  Result Date: 11/29/2019 CLINICAL DATA:  Follow-up examination for known acute infarct. EXAM: MRA NECK WITHOUT CONTRAST MRA HEAD WITHOUT CONTRAST TECHNIQUE: Multiplanar and multiecho pulse sequences of the neck were obtained without intravenous contrast. Angiographic images of the neck were obtained using MRA technique without and with intravenous contast.; Angiographic images of the Circle of Willis were obtained using MRA technique without intravenous contrast. COMPARISON:  Prior brain MRI from earlier the same day. FINDINGS: MRA NECK FINDINGS AORTIC ARCH: Visualized aortic arch of normal caliber with normal 3 vessel morphology. No flow-limiting stenosis RIGHT CAROTID SYSTEM: Right common carotid artery widely patent from its origin to the bifurcation without stenosis. No significant atheromatous narrowing or irregularity about the right bifurcation. Right ICA widely patent distally to the skull base without stenosis or occlusion. LEFT CAROTID SYSTEM: Left common carotid artery widely patent from its origin to the bifurcation without stenosis. No significant atheromatous narrowing or stenosis about the left bifurcation. Left ICA widely patent distally to the skull base without stenosis or occlusion. VERTEBRAL ARTERIES: Both vertebral arteries arise from the subclavian arteries. No proximal subclavian artery stenosis. V1 segments somewhat tortuous bilaterally. Vertebral arteries  widely patent within the neck without stenosis or occlusion. Incidental note made of a 2.6 cm soft tissue nodule positioned at the center aspect of the upper mediastinum (series 4, image 112). 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 MRA HEAD FINDINGS ANTERIOR CIRCULATION: Petrous, cavernous, and  supraclinoid segments of both internal carotid arteries are widely patent with symmetric antegrade flow. Origin of the ophthalmic arteries patent and normal. ICA termini well perfused. A1 segments patent bilaterally. Normal anterior communicating artery complex. Anterior cerebral arteries patent to their distal aspects without stenosis. No M1 stenosis or occlusion. Normal MCA bifurcations. Distal MCA branches well perfused and symmetric. POSTERIOR CIRCULATION: Vertebral arteries largely codominant and patent to the vertebrobasilar junction without stenosis. Both picas patent. Basilar widely patent to its distal aspect without stenosis. Superior cerebral arteries patent bilaterally. Right PCA supplied via the basilar. Left PCA supplied both via the basilar as well as a prominent left posterior communicating artery. Both PCAs widely patent to their distal aspects without stenosis. No intracranial aneurysm. IMPRESSION: 1. Normal MRA of the head and neck. No large vessel occlusion or hemodynamically significant stenosis. 2. 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 Per history, this has been previously evaluated and biopsied in the past. Correlation with history recommended. Further evaluation with dedicated thyroid ultrasound recommended as clinically warranted. (Ref: J Am Coll Radiol. 2015 Feb;12(2): 143-50). Electronically Signed   By: Jeannine Boga M.D.   On: 11/29/2019 19:46   MR ANGIO NECK WO CONTRAST  Result Date: 11/29/2019 CLINICAL DATA:  Follow-up examination for known acute infarct. EXAM: MRA NECK WITHOUT CONTRAST MRA HEAD WITHOUT CONTRAST TECHNIQUE: Multiplanar and multiecho pulse sequences of the neck were obtained without intravenous contrast. Angiographic images of the neck were obtained using MRA technique without and with intravenous contast.; Angiographic images of the Circle of  Willis were obtained using MRA technique without intravenous contrast. COMPARISON:  Prior brain MRI from earlier the same day. FINDINGS: MRA NECK FINDINGS AORTIC ARCH: Visualized aortic arch of normal caliber with normal 3 vessel morphology. No flow-limiting stenosis RIGHT CAROTID SYSTEM: Right common carotid artery widely patent from its origin to the bifurcation without stenosis. No significant atheromatous narrowing or irregularity about the right bifurcation. Right ICA widely patent distally to the skull base without stenosis or occlusion. LEFT CAROTID SYSTEM: Left common carotid artery widely patent from its origin to the bifurcation without stenosis. No significant atheromatous narrowing or stenosis about the left bifurcation. Left ICA widely patent distally to the skull base without stenosis or occlusion. VERTEBRAL ARTERIES: Both vertebral arteries arise from the subclavian arteries. No proximal subclavian artery stenosis. V1 segments somewhat tortuous bilaterally. Vertebral arteries widely patent within the neck without stenosis or occlusion. Incidental note made of a 2.6 cm soft tissue nodule positioned at the center aspect of the upper mediastinum (series 4, image 112). 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 MRA HEAD FINDINGS ANTERIOR CIRCULATION: Petrous, cavernous, and supraclinoid segments of both internal carotid arteries are widely patent with symmetric antegrade flow. Origin of the ophthalmic arteries patent and normal. ICA termini well perfused. A1 segments patent bilaterally. Normal anterior communicating artery complex. Anterior cerebral arteries patent to their distal aspects without stenosis. No M1 stenosis or occlusion. Normal MCA bifurcations.  Distal MCA branches well perfused and symmetric. POSTERIOR CIRCULATION: Vertebral arteries largely codominant and patent to the vertebrobasilar junction  without stenosis. Both picas patent. Basilar widely patent to its distal aspect without stenosis. Superior cerebral arteries patent bilaterally. Right PCA supplied via the basilar. Left PCA supplied both via the basilar as well as a prominent left posterior communicating artery. Both PCAs widely patent to their distal aspects without stenosis. No intracranial aneurysm. IMPRESSION: 1. Normal MRA of the head and neck. No large vessel occlusion or hemodynamically significant stenosis. 2. 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 Per history, this has been previously evaluated and biopsied in the past. Correlation with history recommended. Further evaluation with dedicated thyroid ultrasound recommended as clinically warranted. (Ref: J Am Coll Radiol. 2015 Feb;12(2): 143-50). Electronically Signed   By: Jeannine Boga M.D.   On: 11/29/2019 19:46   MR BRAIN WO CONTRAST  Result Date: 11/29/2019 CLINICAL DATA:  Left facial weakness. EXAM: MRI HEAD WITHOUT CONTRAST TECHNIQUE: Multiplanar, multiecho pulse sequences of the brain and surrounding structures were obtained without intravenous contrast. COMPARISON:  Head CT same day FINDINGS: Brain: Diffusion imaging shows acute infarction in the subcortical white matter of the right posterior frontal region. No other acute infarction. No abnormality affects the brainstem or cerebellum. Moderate chronic small-vessel ischemic changes elsewhere within the cerebral hemispheric white matter. Old small left frontal cortical infarction. No mass lesion, hemorrhage, hydrocephalus or extra-axial collection. Vascular: Major vessels at the base of the brain show flow. Skull and upper cervical spine: Negative Sinuses/Orbits: Clear/normal Other: None IMPRESSION: Approximately 1 cm acute infarction in the subcortical white matter of the right posterior frontal region. No mass  effect or hemorrhage. Chronic small-vessel ischemic changes elsewhere affecting the hemispheric white matter. Old small left frontal cortical infarction. Electronically Signed   By: Nelson Chimes M.D.   On: 11/29/2019 14:15   ECHOCARDIOGRAM COMPLETE BUBBLE STUDY  Result Date: 11/30/2019    ECHOCARDIOGRAM REPORT   Patient Name:   Jaclyn Lopez Date of Exam: 11/30/2019 Medical Rec #:  [id number]          Height:       66.0 in Accession #:    [id number]         Weight:       139.6 lb Date of Birth:  1950-07-19         BSA:          1.716 m Patient Age:    24 years           BP:           118/60 mmHg Patient Gender: F                  HR:           76 bpm. Exam Location:  Inpatient Procedure: 2D Echo, Saline Contrast Bubble Study, Strain Analysis, Cardiac            Doppler and Color Doppler Indications:    Stroke  History:        Patient has no prior history of Echocardiogram examinations.                 Stroke. Breast cancer.  Sonographer:    Roseanna Rainbow RDCS Referring Phys: [id number] RONDELL A SMITH  Sonographer Comments: Technically difficult study due to poor echo windows. Low parasternal view. IMPRESSIONS  1. PFO present.  2. Left ventricular ejection fraction, by estimation, is 55 to 60%. The left ventricle has normal function. The left ventricle has no regional wall motion abnormalities. Left ventricular diastolic parameters are consistent with Grade I diastolic dysfunction (impaired relaxation). The average left ventricular global longitudinal strain is -19.9 %. The global longitudinal strain is normal.  3. Right ventricular systolic function is normal. The right ventricular size is normal.  4. The mitral valve is myxomatous. No evidence of mitral valve regurgitation. No evidence of mitral stenosis.  5. The aortic valve is normal in structure. Aortic valve regurgitation is not visualized. No aortic stenosis is present.  6. The inferior vena cava is normal in size with greater than 50% respiratory  variability, suggesting right atrial pressure of 3 mmHg.  7. Agitated saline contrast bubble study was positive with shunting observed within 3-6 cardiac cycles suggestive of interatrial shunt. There is a small patent foramen ovale with predominantly right to left shunting across the atrial septum. Conclusion(s)/Recommendation(s): No intracardiac source of embolism detected on this transthoracic study. A transesophageal echocardiogram is recommended to exclude cardiac source of embolism if clinically indicated. FINDINGS  Left Ventricle: Left ventricular ejection fraction, by estimation, is 55 to 60%. The left ventricle has normal function. The left ventricle has no regional wall motion abnormalities. The average left ventricular global longitudinal strain is -19.9 %. The global longitudinal strain is normal. The left ventricular internal cavity size was normal in size. There is no left ventricular hypertrophy. Left ventricular diastolic parameters are consistent with Grade I diastolic dysfunction (impaired relaxation). Right Ventricle: The right ventricular size is normal. No increase in right ventricular wall thickness. Right ventricular systolic function is normal. Left Atrium: Left atrial size was normal in size. Right Atrium: Right atrial size was normal in size. Pericardium: There is no evidence of pericardial effusion. Mitral Valve: The mitral valve is myxomatous. There is moderate thickening of the mitral valve leaflet(s). Normal mobility of the mitral valve leaflets. No evidence of mitral valve regurgitation. No evidence of mitral valve stenosis. Tricuspid Valve: The tricuspid valve is normal in structure. Tricuspid valve regurgitation is not demonstrated. No evidence of tricuspid stenosis. Aortic Valve: The aortic valve is normal in structure. Aortic valve regurgitation is not visualized. No aortic stenosis is present. Pulmonic Valve: The pulmonic valve was normal in structure. Pulmonic valve regurgitation  is not visualized. No evidence of pulmonic stenosis. Aorta: The aortic root is normal in size and structure. Venous: The inferior vena cava is normal in size with greater than 50% respiratory variability, suggesting right atrial pressure of 3 mmHg. IAS/Shunts: No atrial level shunt detected by color flow Doppler. Agitated saline contrast was given intravenously to evaluate for intracardiac shunting. Agitated saline contrast bubble study was positive with shunting observed within 3-6 cardiac cycles suggestive of interatrial shunt. A small patent foramen ovale is detected with predominantly right to left shunting across the atrial septum. Additional Comments: PFO present.  LEFT VENTRICLE PLAX 2D LVIDd:         3.80 cm     Diastology LVIDs:         2.70 cm     LV e' lateral:   7.94 cm/s LV PW:         1.00 cm     LV E/e' lateral: 6.6 LV IVS:        0.90 cm     LV e' medial:    4.57 cm/s LVOT diam:     1.80 cm  LV E/e' medial:  11.5 LV SV:         50 LV SV Index:   29          2D Longitudinal Strain LVOT Area:     2.54 cm    2D Strain GLS Avg:     -19.9 %  LV Volumes (MOD) LV vol d, MOD A2C: 49.0 ml LV vol d, MOD A4C: 38.9 ml LV vol s, MOD A2C: 19.4 ml LV vol s, MOD A4C: 16.1 ml LV SV MOD A2C:     29.6 ml LV SV MOD A4C:     38.9 ml LV SV MOD BP:      28.4 ml RIGHT VENTRICLE            IVC RV S prime:     9.90 cm/s  IVC diam: 1.70 cm TAPSE (M-mode): 1.9 cm LEFT ATRIUM             Index       RIGHT ATRIUM           Index LA diam:        3.10 cm 1.81 cm/m  RA Area:     10.40 cm LA Vol (A2C):   19.4 ml 11.30 ml/m RA Volume:   21.70 ml  12.64 ml/m LA Vol (A4C):   17.3 ml 10.08 ml/m LA Biplane Vol: 19.6 ml 11.42 ml/m  AORTIC VALVE LVOT Vmax:   91.60 cm/s LVOT Vmean:  62.000 cm/s LVOT VTI:    0.198 m  AORTA Ao Root diam: 3.20 cm Ao Asc diam:  2.80 cm MITRAL VALVE MV Area (PHT): 2.56 cm    SHUNTS MV Decel Time: 296 msec    Systemic VTI:  0.20 m MV E velocity: 52.60 cm/s  Systemic Diam: 1.80 cm MV A velocity: 62.10  cm/s MV E/A ratio:  0.85 Candee Furbish MD Electronically signed by Candee Furbish MD Signature Date/Time: 11/30/2019/11:14:42 AM    Final        Subjective: Patient seen and examined today.  Progress treatment written.  Has no complaints other than her left side being weaker.  Also had difficulty thinking of the words getting him out but her speech is clear.  Discharge Exam: Vitals:   11/30/19 0800 11/30/19 1227  BP: 118/60 107/68  Pulse: 76 85  Resp: 16 16  Temp: 97.9 F (36.6 C) 97.6 F (36.4 C)  SpO2: 95% 100%   Vitals:   11/30/19 0057 11/30/19 0400 11/30/19 0800 11/30/19 1227  BP: 113/65 104/64 118/60 107/68  Pulse: 69 89 76 85  Resp: 18 16 16 16   Temp: 97.7 F (36.5 C) 97.6 F (36.4 C) 97.9 F (36.6 C) 97.6 F (36.4 C)  TempSrc: Oral Oral Oral Oral  SpO2: 98% 99% 95% 100%  Weight:        General: Pt is alert, awake, not in acute distress Cardiovascular: RRR, S1/S2 +, no rubs, no gallops Respiratory: CTA bilaterally, no wheezing, no rhonchi Abdominal: Soft, NT, ND, bowel sounds + Extremities: no edema, no cyanosis    The results of significant diagnostics from this hospitalization (including imaging, microbiology, ancillary and laboratory) are listed below for reference.     Microbiology: Recent Results (from the past 240 hour(s))  SARS Coronavirus 2 by RT PCR (hospital order, performed in [facility] hospital lab) Nasopharyngeal Nasopharyngeal Swab     Status: None   Collection Time: 11/29/19  2:59 PM   Specimen: Nasopharyngeal Swab  Result Value Ref Range Status  SARS Coronavirus 2 NEGATIVE NEGATIVE Final    Comment: (NOTE) SARS-CoV-2 target nucleic acids are NOT DETECTED.  The SARS-CoV-2 RNA is generally detectable in upper and lower respiratory specimens during the acute phase of infection. The lowest concentration of SARS-CoV-2 viral copies this assay can detect is 250 copies / mL. A negative result does not preclude SARS-CoV-2 infection and should not  be used as the sole basis for treatment or other patient management decisions.  A negative result may occur with improper specimen collection / handling, submission of specimen other than nasopharyngeal swab, presence of viral mutation(s) within the areas targeted by this assay, and inadequate number of viral copies (<250 copies / mL). A negative result must be combined with clinical observations, patient history, and epidemiological information.  Fact Sheet for Patients:   StrictlyIdeas.no  Fact Sheet for Healthcare Providers: BankingDealers.co.za  This test is not yet approved or  cleared by the [place] FDA and has been authorized for detection and/or diagnosis of SARS-CoV-2 by FDA under an Emergency Use Authorization (EUA).  This EUA will remain in effect (meaning this test can be used) for the duration of the COVID-19 declaration under Section 564(b)(1) of the Act, 21 U.S.C. section 360bbb-3(b)(1), unless the authorization is terminated or revoked sooner.  Performed at [facility], [place] [address], [place], [place] [address]      Labs: BNP (last 3 results) No results for input(s): BNP in the last 8760 hours. Basic Metabolic Panel: Recent Labs  Lab 11/29/19 1034 11/30/19 1052  NA 140  --   K 4.5  --   CL 105  --   CO2 26  --   GLUCOSE 82  --   BUN 14  --   CREATININE 1.26* 1.23*  CALCIUM 9.3  --    Liver Function Tests: Recent Labs  Lab 11/29/19 1034  AST 20  ALT 18  ALKPHOS 112  BILITOT 0.8  PROT 6.7  ALBUMIN 4.0   No results for input(s): LIPASE, AMYLASE in the last 168 hours. No results for input(s): AMMONIA in the last 168 hours. CBC: Recent Labs  Lab 11/29/19 1034  WBC 7.3  NEUTROABS 4.4  HGB 13.9  HCT 43.3  MCV 88.9  PLT 232   Cardiac Enzymes: No results for input(s): CKTOTAL, CKMB, CKMBINDEX, TROPONINI in the last 168 hours. BNP: Invalid input(s): POCBNP CBG: No results for  input(s): GLUCAP in the last 168 hours. D-Dimer No results for input(s): DDIMER in the last 72 hours. Hgb A1c Recent Labs    11/29/19 1919  HGBA1C 5.3   Lipid Profile Recent Labs    11/29/19 1919  CHOL 249*  HDL 78  LDLCALC 155*  TRIG 81  CHOLHDL 3.2   Thyroid function studies No results for input(s): TSH, T4TOTAL, T3FREE, THYROIDAB in the last 72 hours.  Invalid input(s): FREET3 Anemia work up No results for input(s): VITAMINB12, FOLATE, FERRITIN, TIBC, IRON, RETICCTPCT in the last 72 hours. Urinalysis    Component Value Date/Time   COLORURINE STRAW (A) 11/29/2019 1140   APPEARANCEUR CLEAR 11/29/2019 1140   LABSPEC 1.004 (L) 11/29/2019 1140   PHURINE 7.0 11/29/2019 1140   GLUCOSEU NEGATIVE 11/29/2019 1140   HGBUR NEGATIVE 11/29/2019 1140   BILIRUBINUR NEGATIVE 11/29/2019 1140   KETONESUR NEGATIVE 11/29/2019 1140   PROTEINUR NEGATIVE 11/29/2019 1140   UROBILINOGEN 0.2 08/30/2014 1122   NITRITE NEGATIVE 11/29/2019 1140   LEUKOCYTESUR NEGATIVE 11/29/2019 1140   Sepsis Labs Invalid input(s): PROCALCITONIN,  WBC,  [facility]  Microbiology Recent Results (from the past 240 hour(s))  SARS Coronavirus 2 by RT PCR (hospital order, performed in [facility] hospital lab) Nasopharyngeal Nasopharyngeal Swab     Status: None   Collection Time: 11/29/19  2:59 PM   Specimen: Nasopharyngeal Swab  Result Value Ref Range Status   SARS Coronavirus 2 NEGATIVE NEGATIVE Final    Comment: (NOTE) SARS-CoV-2 target nucleic acids are NOT DETECTED.  The SARS-CoV-2 RNA is generally detectable in upper and lower respiratory specimens during the acute phase of infection. The lowest concentration of SARS-CoV-2 viral copies this assay can detect is 250 copies / mL. A negative result does not preclude SARS-CoV-2 infection and should not be used as the sole basis for treatment or other patient management decisions.  A negative result may occur with improper specimen collection / handling,  submission of specimen other than nasopharyngeal swab, presence of viral mutation(s) within the areas targeted by this assay, and inadequate number of viral copies (<250 copies / mL). A negative result must be combined with clinical observations, patient history, and epidemiological information.  Fact Sheet for Patients:   StrictlyIdeas.no  Fact Sheet for Healthcare Providers: BankingDealers.co.za  This test is not yet approved or  cleared by the [place] FDA and has been authorized for detection and/or diagnosis of SARS-CoV-2 by FDA under an Emergency Use Authorization (EUA).  This EUA will remain in effect (meaning this test can be used) for the duration of the COVID-19 declaration under Section 564(b)(1) of the Act, 21 U.S.C. section 360bbb-3(b)(1), unless the authorization is terminated or revoked sooner.  Performed at [facility], [place] [address] [place], [place] [address]      Time coordinating discharge: Over 30 minutes  SIGNED:   Nolberto Hanlon, MD  [facility] 11/30/2019, 4:17 PM Pager   If 7PM-7AM, please contact night-coverage [email or web address] Password [email or web address]

## 2019-11-30 NOTE — Progress Notes (Signed)
  Echocardiogram 2D Echocardiogram has been performed.  Jaclyn Lopez 11/30/2019, 10:09 AM

## 2019-11-30 NOTE — Care Management Obs Status (Signed)
[place] NOTIFICATION   Patient Details  Name: Jaclyn Lopez MRN: [id number] Date of Birth: 04-17-1951   Medicare Observation Status Notification Given:  Yes    Marilu Favre, RN 11/30/2019, 1:32 PM

## 2019-12-31 ENCOUNTER — Encounter (INDEPENDENT_AMBULATORY_CARE_PROVIDER_SITE_OTHER): Payer: Medicare PPO | Admitting: Adult Health

## 2019-12-31 ENCOUNTER — Encounter: Payer: Self-pay | Admitting: Adult Health

## 2019-12-31 MED ORDER — ATORVASTATIN CALCIUM 80 MG PO TABS: 80.0000 mg | ORAL_TABLET | Freq: Every day | ORAL | 0 refills | Status: DC

## 2019-12-31 NOTE — Progress Notes (Signed)
[name] initially presented today regarding recent stroke with hospitalization follow-up.  While CMA Candace was rooming patient, she complained of 4-day onset of headache, nausea and fatigue.  Due to concern of Covid, obtain temperature which read at 100.38F and does report dose of Tylenol 3 hrs prior to visit due to headache.  She denies temperature at home.  She denies any breathing difficulty at this time.  BP 116/65, HR 87, Sp02 97%.  Appropriate COVID-19 precautions were taken by myself and Candace, CMA  Due to concern of Covid symptoms, she was advised that she will need to contact PCP and obtain Covid testing.  If testing negative, recommend rescheduling follow-up next week but if positive, recommend rescheduling visit once quarantine timeframe completed.  She requests refill for atorvastatin as she has not been able to follow-up with her PCP since discharge.  Short-term refill will be placed but advised ongoing prescribing will be completed by PCP.  She has no specific questions or concerns at this time.  She was advised to call office if any shall arise during the interval time.  Today's visit will be no charge due to need of rescheduling

## 2020-01-02 DIAGNOSIS — Z20828 Contact with and (suspected) exposure to other viral communicable diseases: Secondary | ICD-10-CM | POA: Diagnosis not present

## 2020-01-07 ENCOUNTER — Encounter: Payer: Self-pay | Admitting: Nurse Practitioner

## 2020-01-07 DIAGNOSIS — Z1231 Encounter for screening mammogram for malignant neoplasm of breast: Secondary | ICD-10-CM | POA: Diagnosis not present

## 2020-01-08 DIAGNOSIS — E041 Nontoxic single thyroid nodule: Secondary | ICD-10-CM | POA: Diagnosis not present

## 2020-01-08 DIAGNOSIS — R110 Nausea: Secondary | ICD-10-CM | POA: Diagnosis not present

## 2020-01-08 DIAGNOSIS — D72829 Elevated white blood cell count, unspecified: Secondary | ICD-10-CM | POA: Diagnosis not present

## 2020-01-08 DIAGNOSIS — Z8673 Personal history of transient ischemic attack (TIA), and cerebral infarction without residual deficits: Secondary | ICD-10-CM | POA: Diagnosis not present

## 2020-01-08 DIAGNOSIS — R748 Abnormal levels of other serum enzymes: Secondary | ICD-10-CM | POA: Diagnosis not present

## 2020-01-08 DIAGNOSIS — F330 Major depressive disorder, recurrent, mild: Secondary | ICD-10-CM | POA: Diagnosis not present

## 2020-01-09 ENCOUNTER — Other Ambulatory Visit: Payer: Self-pay | Admitting: Internal Medicine

## 2020-01-09 DIAGNOSIS — R748 Abnormal levels of other serum enzymes: Secondary | ICD-10-CM | POA: Diagnosis not present

## 2020-01-09 DIAGNOSIS — D72829 Elevated white blood cell count, unspecified: Secondary | ICD-10-CM | POA: Diagnosis not present

## 2020-01-09 DIAGNOSIS — E041 Nontoxic single thyroid nodule: Secondary | ICD-10-CM

## 2020-01-10 ENCOUNTER — Encounter: Payer: Self-pay | Admitting: Adult Health

## 2020-01-10 ENCOUNTER — Other Ambulatory Visit: Payer: Self-pay

## 2020-01-10 ENCOUNTER — Ambulatory Visit: Payer: Medicare PPO | Admitting: Adult Health

## 2020-01-10 VITALS — BP 103/58 | HR 80 | Ht 66.0 in | Wt 138.0 lb

## 2020-01-10 DIAGNOSIS — E785 Hyperlipidemia, unspecified: Secondary | ICD-10-CM

## 2020-01-10 DIAGNOSIS — I639 Cerebral infarction, unspecified: Secondary | ICD-10-CM | POA: Diagnosis not present

## 2020-01-10 DIAGNOSIS — Q211 Atrial septal defect: Secondary | ICD-10-CM

## 2020-01-10 DIAGNOSIS — Q2112 Patent foramen ovale: Secondary | ICD-10-CM

## 2020-01-10 NOTE — Patient Instructions (Addendum)
Continue aspirin 81 mg daily  and atorvastatin 80 mg daily for secondary stroke prevention  If your liver enzymes continue to be elevated, recommend decreasing atorvastatin to 40 mg daily and recheck in 2 to 4 weeks with your PCP.  If remains elevated despite lowering dosage, may consider decreasing dosage further, changing statin therapy, use of Zetia or possibly injectable type medication such as Repatha or Praluent  Continue to follow up with PCP regarding cholesterol management  Maintain strict control of hypertension with blood pressure goal below 130/90, diabetes with hemoglobin A1c goal below 6.5% and cholesterol with LDL cholesterol (bad cholesterol) goal below 70 mg/dL.       Followup in the future with me in 3 months or call earlier if needed       Thank you for coming to see [place] at [facility]. I hope we have been able to provide you high quality care today.  You may receive a patient satisfaction survey over the next few weeks. We would appreciate your feedback and comments so that we may continue to improve ourselves and the health of our patients.    Preventing High Cholesterol Cholesterol is a white, waxy substance similar to fat that the human body needs to help build cells. The liver makes all the cholesterol that a person's body needs. Having high cholesterol (hypercholesterolemia) increases a person's risk for heart disease and stroke. Extra (excess) cholesterol comes from the food the person eats. High cholesterol can often be prevented with diet and lifestyle changes. If you already have high cholesterol, you can control it with diet and lifestyle changes and with medicine. How can high cholesterol affect me? If you have high cholesterol, deposits (plaques) may build up on the walls of your arteries. The arteries are the blood vessels that carry blood away from your heart. Plaques make the arteries narrower and stiffer. This can limit or block blood  flow and cause blood clots to form. Blood clots:  Are tiny balls of cells that form in your blood.  Can move to the heart or brain, causing a heart attack or stroke. Plaques in arteries greatly increase your risk for heart attack and stroke.Making diet and lifestyle changes can reduce your risk for these conditions that may threaten your life. What can increase my risk? This condition is more likely to develop in people who:  Eat foods that are high in saturated fat or cholesterol. Saturated fat is mostly found in: ? Foods that contain animal fat, such as red meat and some dairy products. ? Certain fatty foods made from plants, such as tropical oils.  Are overweight.  Are not getting enough exercise.  Have a family history of high cholesterol. What actions can I take to prevent this? Nutrition   Eat less saturated fat.  Avoid trans fats (partially hydrogenated oils). These are often found in margarine and in some baked goods, fried foods, and snacks bought in packages.  Avoid precooked or cured meat, such as sausages or meat loaves.  Avoid foods and drinks that have added sugars.  Eat more fruits, vegetables, and whole grains.  Choose healthy sources of protein, such as fish, poultry, lean cuts of red meat, beans, peas, lentils, and nuts.  Choose healthy sources of fat, such as: ? Nuts. ? Vegetable oils, especially olive oil. ? Fish that have healthy fats (omega-3 fatty acids), such as mackerel or salmon. The items listed above may not be a complete list of recommended foods and beverages. Contact  a dietitian for more information. Lifestyle  Lose weight if you are overweight. Losing 5-10 lb (2.3-4.5 kg) can help prevent or control high cholesterol. It can also lower your risk for diabetes and high blood pressure. Ask your health care provider to help you with a diet and exercise plan to lose weight safely.  Do not use any products that contain nicotine or tobacco, such as  cigarettes, e-cigarettes, and chewing tobacco. If you need help quitting, ask your health care provider.  Limit your alcohol intake. ? Do not drink alcohol if:  Your health care provider tells you not to drink.  You are pregnant, may be pregnant, or are planning to become pregnant. ? If you drink alcohol:  Limit how much you use to:  0-1 drink a day for women.  0-2 drinks a day for men.  Be aware of how much alcohol is in your drink. In the [place] one drink equals one 12 oz bottle of beer (355 mL), one 5 oz glass of wine (148 mL), or one 1 oz glass of hard liquor (44 mL). Activity   Get enough exercise. Each week, do at least 150 minutes of exercise that takes a medium level of effort (moderate-intensity exercise). ? This is exercise that:  Makes your heart beat faster and makes you breathe harder than usual.  Allows you to still be able to talk. ? You could exercise in short sessions several times a day or longer sessions a few times a week. For example, on 5 days each week, you could walk fast or ride your bike 3 times a day for 10 minutes each time.  Do exercises as told by your health care provider. Medicines  In addition to diet and lifestyle changes, your health care provider may recommend medicines to help lower cholesterol. This may be a medicine to lower the amount of cholesterol your liver makes. You may need medicine if: ? Diet and lifestyle changes do not lower your cholesterol enough. ? You have high cholesterol and other risk factors for heart disease or stroke.  Take over-the-counter and prescription medicines only as told by your health care provider. General information  Manage your risk factors for high cholesterol. Talk with your health care provider about all your risk factors and how to lower your risk.  Manage other conditions that you have, such as diabetes or high blood pressure (hypertension).  Have blood tests to check your cholesterol levels at  regular points in time as told by your health care provider.  Keep all follow-up visits as told by your health care provider. This is important. Where to find more information  American Heart Association: www.heart.org  National Heart, Lung, and Blood Institute: https://wilson-eaton.com/ Summary  High cholesterol increases your risk for heart disease and stroke. By keeping your cholesterol level low, you can reduce your risk for these conditions.  High cholesterol can often be prevented with diet and lifestyle changes.  Work with your health care provider to manage your risk factors, and have your blood tested regularly. This information is not intended to replace advice given to you by your health care provider. Make sure you discuss any questions you have with your health care provider. Document Revised: 08/25/2018 Document Reviewed: 01/10/2016 Elsevier Patient Education  [place].   Cholesterol Content in Foods Cholesterol is a waxy, fat-like substance that helps to carry fat in the blood. The body needs cholesterol in small amounts, but too much cholesterol can cause damage to the  arteries and heart. Most people should eat less than 200 milligrams (mg) of cholesterol a day. Foods with cholesterol  Cholesterol is found in animal-based foods, such as meat, seafood, and dairy. Generally, low-fat dairy and lean meats have less cholesterol than full-fat dairy and fatty meats. The milligrams of cholesterol per serving (mg per serving) of common cholesterol-containing foods are listed below. Meat and other proteins  Egg -- one large whole egg has 186 mg.  Veal shank -- 4 oz has 141 mg.  Lean ground [place] (93% lean) -- 4 oz has 118 mg.  Fat-trimmed lamb loin -- 4 oz has 106 mg.  Lean ground beef (90% lean) -- 4 oz has 100 mg.  Lobster -- 3.5 oz has 90 mg.  Pork loin chops -- 4 oz has 86 mg.  Canned salmon -- 3.5 oz has 83 mg.  Fat-trimmed beef top loin -- 4 oz has 78  mg.  Frankfurter -- 1 frank (3.5 oz) has 77 mg.  Crab -- 3.5 oz has 71 mg.  Roasted chicken without skin, white meat -- 4 oz has 66 mg.  Light bologna -- 2 oz has 45 mg.  Deli-cut [place] -- 2 oz has 31 mg.  Canned tuna -- 3.5 oz has 31 mg.  Berniece Salines -- 1 oz has 29 mg.  Oysters and mussels (raw) -- 3.5 oz has 25 mg.  Mackerel -- 1 oz has 22 mg.  Trout -- 1 oz has 20 mg.  Pork sausage -- 1 link (1 oz) has 17 mg.  Salmon -- 1 oz has 16 mg.  Tilapia -- 1 oz has 14 mg. Dairy  Soft-serve ice cream --  cup (4 oz) has 103 mg.  Whole-milk yogurt -- 1 cup (8 oz) has 29 mg.  Cheddar cheese -- 1 oz has 28 mg.  American cheese -- 1 oz has 28 mg.  Whole milk -- 1 cup (8 oz) has 23 mg.  2% milk -- 1 cup (8 oz) has 18 mg.  Cream cheese -- 1 tablespoon (Tbsp) has 15 mg.  Cottage cheese --  cup (4 oz) has 14 mg.  Low-fat (1%) milk -- 1 cup (8 oz) has 10 mg.  Sour cream -- 1 Tbsp has 8.5 mg.  Low-fat yogurt -- 1 cup (8 oz) has 8 mg.  Nonfat Greek yogurt -- 1 cup (8 oz) has 7 mg.  Half-and-half cream -- 1 Tbsp has 5 mg. Fats and oils  Cod liver oil -- 1 tablespoon (Tbsp) has 82 mg.  Butter -- 1 Tbsp has 15 mg.  Lard -- 1 Tbsp has 14 mg.  Bacon grease -- 1 Tbsp has 14 mg.  Mayonnaise -- 1 Tbsp has 5-10 mg.  Margarine -- 1 Tbsp has 3-10 mg. Exact amounts of cholesterol in these foods may vary depending on specific ingredients and brands. Foods without cholesterol Most plant-based foods do not have cholesterol unless you combine them with a food that has cholesterol. Foods without cholesterol include:  Grains and cereals.  Vegetables.  Fruits.  Vegetable oils, such as olive, canola, and sunflower oil.  Legumes, such as peas, beans, and lentils.  Nuts and seeds.  Egg whites. Summary  The body needs cholesterol in small amounts, but too much cholesterol can cause damage to the arteries and heart.  Most people should eat less than 200 milligrams (mg) of  cholesterol a day. This information is not intended to replace advice given to you by your health care provider. Make sure you discuss  any questions you have with your health care provider. Document Revised: 04/15/2017 Document Reviewed: 12/28/2016 Elsevier Patient Education  [place].

## 2020-01-10 NOTE — Progress Notes (Signed)
[facility] [address] [place]. [place] [address] [phone]       HOSPITAL FOLLOW UP NOTE  [name] Date of Birth:  [birth year] Medical Record Number:  [id number]   Reason for Referral:  hospital stroke follow up    SUBJECTIVE:   CHIEF COMPLAINT:  Chief Complaint  Patient presents with  . Hospitalization Follow-up    HFU after stroke. States she is still recovering from a stomach virus, but denies any new stroke symptoms.    . treatment room    with son    HPI:   [name] is a 69 y.o. female w/pmh of CKD, breast cancer, hypothyroidism, depression, who presented on 11/29/2019 with slurred speech and left facial droop.  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  MRA head/neck unremarkable.  2D echo EF of 55 to 60%.  PFO present but likely incidental finding and not related to her stroke.  Recommended DAPT for 3 weeks and aspirin alone.  LDL 155 and initiate atorvastatin 80 mg daily.  No history of HTN.  No history or evidence of DM with A1c 5.3.  Other stroke risk factors include prior stroke on imaging (L frontal cortical infarct).  Evaluated by therapy without therapy needs and discharged home in stable condition   Today, 01/10/2020, [name] is being seen for hospital follow-up accompanied by her son  Residual deficits of occasional dysarthria and possible slight left-sided weakness but otherwise greatly improved She has returned back to all prior activities without difficulty Denies new or worsening stroke/TIA symptoms  Completed 3 weeks DAPT and remains on aspirin alone without bleeding or bruising Continues on atorvastatin 80 mg daily without myalgias. Per pt, PCP recently obtained lab work which showed elevated LFTs and had repeat LFTs yesterday but currently awaiting results Blood pressure today 103/58 -no history of HTN HLD managed by PCP  No  further concerns   ROS:   14 system review of systems performed and negative with exception of slurred speech and weakness  PMH:  Past Medical History:  Diagnosis Date  . Atypical glandular cells on Pap smear 2003  . 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 CKD (chronic kidney disease)   . Depression   . GERD (gastroesophageal reflux disease)   . Herpes progenitalis   . 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 Hyperthyroidism   . Hypothyroidism   . IBS (irritable bowel syndrome)   . IC (interstitial cystitis)   . Insomnia   . 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 Subsequent LEEP showed CIN-2 with clear margins and negative ECC  . Osteopenia 11/2018   T score -1.5 FRAX 9% / 1% improved at both hips stable at spine  . Thyroid disease    Hyperthyroid    PSH:  Past Surgical History:  Procedure Laterality Date  . Bladder stretch and Bx  1990  . BREAST LUMPECTOMY  2005   right; cancer  . BREAST LUMPECTOMY  2006   left; pre cancer  . BREAST SURGERY     Reduction  . BUNIONECTOMY  2007, 2011  . CERVICAL CONE BIOPSY  2003  . CESAREAN SECTION  1983  . DILATION AND CURETTAGE OF UTERUS  2009  . ENDOMETRIAL ABLATION     Novasure  .  GANGLION CYST EXCISION Left 1985  . HYSTEROSCOPY  2009  . LEEP  05/2015   CIN-2 with clear margins  . MASTECTOMY, PARTIAL    . MOUTH SURGERY  2012   implants  . TUBAL LIGATION  1984    Social History:  Social History   Socioeconomic History  . Marital status: Divorced    Spouse name: Not on file  . Number of children: 2  . Years of education: Not on file  . Highest education level: Not on file  Occupational History  . Occupation: Pharmacist, hospital    Comment: [employer]  Tobacco Use  . Smoking status: Former Smoker    Types: Cigarettes    Quit date: 05/17/1996    Years since  quitting: 23.6  . Smokeless tobacco: Never Used  Vaping Use  . Vaping Use: Never used  Substance and Sexual Activity  . Alcohol use: Yes    Alcohol/week: 0.0 standard drinks    Comment: once a month  . Drug use: No  . Sexual activity: Never    Birth control/protection: Surgical    Comment: 1st intercourse 69 yo-More than 5 partners  Other Topics Concern  . Not on file  Social History Narrative   Divorced   rare EtOH, no tobacco, drugs   Teached preK at [employer] Strain:   . Difficulty of Paying Living Expenses: Not on file  Food Insecurity:   . Worried About Charity fundraiser in the Last Year: Not on file  . Ran Out of Food in the Last Year: Not on file  Transportation Needs:   . Lack of Transportation (Medical): Not on file  . Lack of Transportation (Non-Medical): Not on file  Physical Activity:   . Days of Exercise per Week: Not on file  . Minutes of Exercise per Session: Not on file  Stress:   . Feeling of Stress : Not on file  Social Connections:   . Frequency of Communication with Friends and Family: Not on file  . Frequency of Social Gatherings with Friends and Family: Not on file  . Attends Religious Services: Not on file  . Active Member of Clubs or Organizations: Not on file  . Attends Archivist Meetings: Not on file  . Marital Status: Not on file  Intimate Partner Violence:   . Fear of Current or Ex-Partner: Not on file  . Emotionally Abused: Not on file  . Physically Abused: Not on file  . Sexually Abused: Not on file    Family History:  Family History  Problem Relation Age of Onset  . COPD Mother   . Dementia Mother   . Bipolar disorder Mother   . Lung cancer Father   . Cancer - Lung Father   . Breast cancer Maternal Grandmother        Age 36's  . Colon cancer Neg Hx   . Stomach cancer Neg Hx   . Pancreatic cancer Neg Hx   . Esophageal cancer Neg Hx   . Rectal cancer Neg Hx      Medications:   Current Outpatient Medications on File Prior to Visit  Medication Sig Dispense Refill  . aspirin EC 81 MG EC tablet Take 1 tablet (81 mg total) by mouth daily. Swallow whole. 30 tablet 3  . atorvastatin (LIPITOR) 80 MG tablet Take 1 tablet (80 mg total) by mouth daily. 90 tablet 0  . BIOTIN PO Take 1 tablet by mouth  daily.     . buPROPion (WELLBUTRIN XL) 150 MG 24 hr tablet Take 150 mg by mouth every morning.    . Cholecalciferol (VITAMIN D PO) Take 1,000 Units by mouth daily.     Marland Kitchen FLUoxetine (PROZAC) 20 MG capsule Take 20 mg by mouth daily.     Marland Kitchen levothyroxine (SYNTHROID, LEVOTHROID) 50 MCG tablet Take 50 mcg by mouth daily.     . mirtazapine (REMERON) 15 MG tablet Take 3.75 mg by mouth at bedtime.     . ondansetron (ZOFRAN-ODT) 8 MG disintegrating tablet     . zaleplon (SONATA) 10 MG capsule Take 10 mg by mouth at bedtime as needed for sleep.     No current facility-administered medications on file prior to visit.    Allergies:   Allergies  Allergen Reactions  . Symmetrel [Amantadine Hcl] Hives  . Tamiflu [Oseltamivir Phosphate] Hives      OBJECTIVE:  Physical Exam  Vitals:   01/10/20 1000  BP: (!) 103/58  Pulse: 80  Weight: 138 lb (62.6 kg)  Height: 5\' 6"  (1.676 m)   Body mass index is 22.27 kg/m. No exam data present  No flowsheet data found.   General: well developed, well nourished, pleasant middle-age Caucasian female, seated, in no evident distress Head: head normocephalic and atraumatic.   Neck: supple with no carotid or supraclavicular bruits Cardiovascular: regular rate and rhythm, no murmurs Musculoskeletal: no deformity Skin:  no rash/petichiae Vascular:  Normal pulses all extremities   Neurologic Exam Mental Status: Awake and fully alert.   Unable to appreciate speech deficit during visit.  Oriented to place and time. Recent and remote memory intact. Attention span, concentration and fund of knowledge appropriate. Mood and affect  appropriate.  Cranial Nerves: Fundoscopic exam reveals sharp disc margins. Pupils equal, briskly reactive to light. Extraocular movements full without nystagmus. Visual fields full to confrontation. Hearing intact. Facial sensation intact.  Left lower facial weakness.  Tongue, and palate moves normally and symmetrically.  Motor: Normal bulk and tone. Normal strength in all tested extremity muscles except slightly decreased left hand dexterity and hip flexor weakness. Sensory.: intact to touch , pinprick , position and vibratory sensation.  Coordination: Rapid alternating movements normal in all extremities. Finger-to-nose and heel-to-shin performed accurately bilaterally.  Orbits right arm over left arm Gait and Station: Arises from chair without difficulty. Stance is normal. Gait demonstrates normal stride length and balance Reflexes: 1+ and symmetric. Toes downgoing.     NIHSS  1 (NIHSS 2 upon admission) Modified [place]  1     ASSESSMENT: Jaclyn Lopez is a 69 y.o. year old female presented with left facial droop and dysarthria on 11/29/2019 with stroke work-up revealing right frontal lobe acute infarct secondary to small vessel disease. Vascular risk factors include prior stroke on imaging, HLD and PFO.      PLAN:  1. R frontal stroke :  a. Residual deficit: Decreased left hand dexterity, mild hip flexor weakness, left facial weakness and occasional dysarthria.  Has returned back to all prior activities without difficulty b. Continue aspirin 81 mg daily  and atorvastatin 80 mg daily for secondary stroke prevention.  c. Close PCP follow up for aggressive stroke risk factor management  2. HLD: LDL goal <70. Recent LDL 155.  Concern for elevated LFTs with repeat lab work yesterday currently awaiting results.  Advise if LFTs remain elevated, would recommend decreasing dosage or changing therapy.  F/u with PCP for management as well as prescribing of statin 3.  PFO: Incidental finding  not correlating to current stroke     Follow up in 3 months or call earlier if needed   I spent 45 minutes of face-to-face and non-face-to-face time with patient and son.  This included previsit chart review, lab review, study review, order entry, electronic health record documentation, patient education regarding recent stroke, residual deficits, importance of managing stroke risk factors and answered all questions to patient satisfaction     Frann Rider, [facility]  [facility] [address] [place],  [address]  Phone [phone] Fax [phone] Note: This document was prepared with digital dictation and possible smart phrase technology. Any transcriptional errors that result from this process are unintentional.

## 2020-01-15 ENCOUNTER — Ambulatory Visit
Admission: RE | Admit: 2020-01-15 | Discharge: 2020-01-15 | Disposition: A | Discharge status: Home / Self Care | Payer: Medicare PPO | Source: Ambulatory Visit | Attending: Internal Medicine | Admitting: Internal Medicine

## 2020-01-15 DIAGNOSIS — R748 Abnormal levels of other serum enzymes: Secondary | ICD-10-CM | POA: Diagnosis not present

## 2020-01-15 DIAGNOSIS — E041 Nontoxic single thyroid nodule: Secondary | ICD-10-CM

## 2020-01-15 NOTE — Progress Notes (Signed)
I agree with the above plan 

## 2020-01-22 DIAGNOSIS — R748 Abnormal levels of other serum enzymes: Secondary | ICD-10-CM | POA: Diagnosis not present

## 2020-01-30 DIAGNOSIS — Z8673 Personal history of transient ischemic attack (TIA), and cerebral infarction without residual deficits: Secondary | ICD-10-CM | POA: Diagnosis not present

## 2020-01-30 DIAGNOSIS — R7989 Other specified abnormal findings of blood chemistry: Secondary | ICD-10-CM | POA: Diagnosis not present

## 2020-01-30 DIAGNOSIS — R748 Abnormal levels of other serum enzymes: Secondary | ICD-10-CM | POA: Diagnosis not present

## 2020-01-31 ENCOUNTER — Telehealth: Payer: Self-pay | Admitting: Adult Health

## 2020-01-31 ENCOUNTER — Other Ambulatory Visit (HOSPITAL_COMMUNITY): Payer: Self-pay | Admitting: Internal Medicine

## 2020-01-31 ENCOUNTER — Other Ambulatory Visit: Payer: Self-pay | Admitting: Internal Medicine

## 2020-01-31 DIAGNOSIS — R748 Abnormal levels of other serum enzymes: Secondary | ICD-10-CM

## 2020-02-01 ENCOUNTER — Ambulatory Visit (HOSPITAL_COMMUNITY)
Admission: RE | Admit: 2020-02-01 | Discharge: 2020-02-01 | Disposition: A | Discharge status: Home / Self Care | Payer: Medicare PPO | Source: Ambulatory Visit | Attending: Internal Medicine | Admitting: Internal Medicine

## 2020-02-01 ENCOUNTER — Other Ambulatory Visit: Payer: Self-pay

## 2020-02-01 DIAGNOSIS — R748 Abnormal levels of other serum enzymes: Secondary | ICD-10-CM | POA: Diagnosis not present

## 2020-02-01 DIAGNOSIS — K8020 Calculus of gallbladder without cholecystitis without obstruction: Secondary | ICD-10-CM | POA: Diagnosis not present

## 2020-02-01 NOTE — Telephone Encounter (Signed)
error 

## 2020-02-14 ENCOUNTER — Telehealth: Payer: Self-pay | Admitting: Adult Health

## 2020-02-14 NOTE — Telephone Encounter (Signed)
error 

## 2020-03-03 DIAGNOSIS — D72829 Elevated white blood cell count, unspecified: Secondary | ICD-10-CM | POA: Diagnosis not present

## 2020-03-03 DIAGNOSIS — Z13220 Encounter for screening for lipoid disorders: Secondary | ICD-10-CM | POA: Diagnosis not present

## 2020-03-10 DIAGNOSIS — Z8673 Personal history of transient ischemic attack (TIA), and cerebral infarction without residual deficits: Secondary | ICD-10-CM | POA: Diagnosis not present

## 2020-03-10 DIAGNOSIS — R748 Abnormal levels of other serum enzymes: Secondary | ICD-10-CM | POA: Diagnosis not present

## 2020-03-10 DIAGNOSIS — Z23 Encounter for immunization: Secondary | ICD-10-CM | POA: Diagnosis not present

## 2020-03-18 ENCOUNTER — Other Ambulatory Visit (INDEPENDENT_AMBULATORY_CARE_PROVIDER_SITE_OTHER): Payer: Medicare PPO

## 2020-03-18 ENCOUNTER — Encounter: Payer: Self-pay | Admitting: Internal Medicine

## 2020-03-18 ENCOUNTER — Ambulatory Visit: Payer: Medicare PPO | Admitting: Internal Medicine

## 2020-03-18 VITALS — BP 116/62 | HR 72 | Ht 65.5 in | Wt 143.5 lb

## 2020-03-18 DIAGNOSIS — D721 Eosinophilia, unspecified: Secondary | ICD-10-CM

## 2020-03-18 DIAGNOSIS — K8020 Calculus of gallbladder without cholecystitis without obstruction: Secondary | ICD-10-CM | POA: Diagnosis not present

## 2020-03-18 DIAGNOSIS — R748 Abnormal levels of other serum enzymes: Secondary | ICD-10-CM

## 2020-03-18 LAB — CBC WITH DIFFERENTIAL/PLATELET
Basophils Absolute: 0.1 10*3/uL (ref 0.0–0.1)
Basophils Relative: 1.2 % (ref 0.0–3.0)
Eosinophils Absolute: 0.2 10*3/uL (ref 0.0–0.7)
Eosinophils Relative: 3.2 % (ref 0.0–5.0)
HCT: 39.8 % (ref 36.0–46.0)
Hemoglobin: 13.3 g/dL (ref 12.0–15.0)
Lymphocytes Relative: 34.2 % (ref 12.0–46.0)
Lymphs Abs: 2.4 10*3/uL (ref 0.7–4.0)
MCHC: 33.3 g/dL (ref 30.0–36.0)
MCV: 87.2 fl (ref 78.0–100.0)
Monocytes Absolute: 0.6 10*3/uL (ref 0.1–1.0)
Monocytes Relative: 8.1 % (ref 3.0–12.0)
Neutro Abs: 3.8 10*3/uL (ref 1.4–7.7)
Neutrophils Relative %: 53.3 % (ref 43.0–77.0)
Platelets: 197 10*3/uL (ref 150.0–400.0)
RBC: 4.56 Mil/uL (ref 3.87–5.11)
RDW: 13.6 % (ref 11.5–15.5)
WBC: 7.2 10*3/uL (ref 4.0–10.5)

## 2020-03-18 LAB — HEPATIC FUNCTION PANEL
ALT: 54 U/L — ABNORMAL HIGH (ref 0–35)
AST: 37 U/L (ref 0–37)
Albumin: 4.3 g/dL (ref 3.5–5.2)
Alkaline Phosphatase: 151 U/L — ABNORMAL HIGH (ref 39–117)
Bilirubin, Direct: 0.1 mg/dL (ref 0.0–0.3)
Total Bilirubin: 0.5 mg/dL (ref 0.2–1.2)
Total Protein: 6.8 g/dL (ref 6.0–8.3)

## 2020-03-18 NOTE — Patient Instructions (Signed)

## 2020-03-18 NOTE — Progress Notes (Signed)
HAILEE HOLLICK 69 y.o. 05/01/1951 [id number]  Assessment & Plan:   Encounter Diagnoses  Name Primary?  . Abnormal alkaline phosphatase test Yes  . Abnormal transaminases   . Eosinophilia, unspecified type   . Asymptomatic gallstones     She has had recurrent issues with abnormal alkaline phosphatase and transaminases with a marked increase in alk phos this fall.  It is improving.  In the past she had a mildly positive ANA.  GGT is elevated which indicates alk phos is coming from the liver most likely.  Imaging has been relatively unrevealing other than increased echogenicity.  She had negative hepatitis C antibody in 2019 and she is vaccinated for hepatitis B. Celiac serology negative 2020   I am going to repeat her lab work-up to see if anything changes.  Add an anti-smooth muscle antibody this time as well.  I wonder if she might not have autoimmune hepatitis though typically would see more of a transaminase as opposed alkaline phosphatase predominance.  I do not think nor have I thought the gallstones were related.  Her prior GI symptoms sound more intestinal in origin or gastric.  Further plans pending these results.  Orders Placed This Encounter  Procedures  . Anti-smooth muscle antibody, IgG  . ANA  . Hepatic function panel  . IgG  . Mitochondrial antibodies  . CBC with Differential/Platelet   I appreciate the opportunity to care for this patient. CC: Deland Pretty, MD   Subjective:   Chief Complaint: Abnormal labs alk phos and transaminases  HPI Juliann Pulse returns at the request of Dr. Shelia Media related to abnormal alkaline phosphatase and transaminases.  She had this problem in 2019 and it resolved on its own and the work-up we did showed a mildly positive ANA and negative mitochondrial antibodies then.  She had increased hepatic echogenicity in 2020 on ultrasound and a large gallstone in the gallbladder but otherwise normal limited right upper quadrant  ultrasound.  She had a stroke this summer and had normal LFTs in July when hospitalized.  Lab results from August showed alk phos 745 on the 24th then 671 426 on the 25th in the 31st and then 298 on 7 September.  In June alk phos was 131 just slightly elevated and it was 155 in October 2020.  So the alk phos has been going up and down.  It has normalized at times as well.  Transaminases were not elevated in the past to my recollection and they were 238 191 and 82 for the ALT in late August and 78 and 65 and then 36 with a AST.  Her bilirubin has been normal.  Her GGT has gone up with these changes at 207 and 231 on September 7 and September 15.  TSH normal eosinophils were up at 17 and 19% on August 24 and 25   She also had an ultrasound in December 2018 because of abnormal LFTs it was negative   She does not have abdominal pain and really feels well overall.  Medication changes were addition of high-dose atorvastati or increase in that over the summer.  She was seen in December 2020 with some right upper quadrant pain nausea vomiting and loose stools went on to have an EGD and a colonoscopy.  5 cm hiatal hernia, negative random colon biopsies and 4 subcentimeter adenomas and a normal terminal ileum.  Also did have colonic diverticulosis.  Was given a trial of Creon at that time and omeprazole was helping heartburn.  The  thought was she had IBS.  Wt Readings from Last 3 Encounters:  03/18/20 143 lb 8 oz (65.1 kg)  01/10/20 138 lb (62.6 kg)  12/31/19 142 lb (64.4 kg)     Allergies  Allergen Reactions  . Symmetrel [Amantadine Hcl] Hives  . Tamiflu [Oseltamivir Phosphate] Hives   Current Meds  Medication Sig  . aspirin EC 81 MG EC tablet Take 1 tablet (81 mg total) by mouth daily. Swallow whole.  Marland Kitchen atorvastatin (LIPITOR) 80 MG tablet Take 1 tablet (80 mg total) by mouth daily. (Patient taking differently: Take 40 mg by mouth daily. )  . BIOTIN PO Take 1 tablet by mouth daily.   Marland Kitchen  buPROPion (WELLBUTRIN XL) 150 MG 24 hr tablet Take 150 mg by mouth every morning.  . Cholecalciferol (VITAMIN D PO) Take 1,000 Units by mouth daily.   Marland Kitchen FLUoxetine (PROZAC) 20 MG capsule Take 20 mg by mouth daily.   Marland Kitchen levothyroxine (SYNTHROID, LEVOTHROID) 50 MCG tablet Take 50 mcg by mouth daily.   . mirtazapine (REMERON) 15 MG tablet Take 3.75 mg by mouth at bedtime.   . ondansetron (ZOFRAN-ODT) 8 MG disintegrating tablet   . zaleplon (SONATA) 10 MG capsule Take 10 mg by mouth at bedtime as needed for sleep.   Past Medical History:  Diagnosis Date  . Atypical glandular cells on Pap smear 2003  . 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 CKD (chronic kidney disease)   . CVA (cerebral vascular accident) ([place])   . Depression   . GERD (gastroesophageal reflux disease)   . Herpes progenitalis   . 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 Hyperlipidemia   . Hyperthyroidism   . Hypothyroidism   . IBS (irritable bowel syndrome)   . IC (interstitial cystitis)   . Insomnia   . 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 Subsequent LEEP showed CIN-2 with clear margins and negative ECC  . Osteopenia 11/2018   T score -1.5 FRAX 9% / 1% improved at both hips stable at spine  . Stroke ([place]) 11/2019  . Thyroid disease    Hyperthyroid   Past Surgical History:  Procedure Laterality Date  . Bladder stretch and Bx  1990  . BREAST LUMPECTOMY  2005   right; cancer  . BREAST LUMPECTOMY  2006   left; pre cancer  . BREAST SURGERY     Reduction  . BUNIONECTOMY  2007, 2011  . CERVICAL CONE BIOPSY  2003  . CESAREAN SECTION  1983  . DILATION AND CURETTAGE OF UTERUS  2009  . ENDOMETRIAL ABLATION     Novasure  . GANGLION CYST EXCISION Left 1985  . HYSTEROSCOPY  2009  . LEEP  05/2015   CIN-2 with clear margins  . MASTECTOMY, PARTIAL    .  MOUTH SURGERY  2012   implants  . 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   Review of Systems As per HPI  Objective:   Physical Exam BP 116/62 (BP Location: Left Arm, Patient Position: Sitting,  Cuff Size: Normal)   Pulse 72   Ht 5' 5.5" (1.664 m) Comment: height measured without shoes  Wt 143 lb 8 oz (65.1 kg)   BMI 23.52 kg/m  Well-developed well-nourished no acute distress

## 2020-03-20 DIAGNOSIS — H2513 Age-related nuclear cataract, bilateral: Secondary | ICD-10-CM | POA: Diagnosis not present

## 2020-03-20 DIAGNOSIS — H43813 Vitreous degeneration, bilateral: Secondary | ICD-10-CM | POA: Diagnosis not present

## 2020-03-22 ENCOUNTER — Other Ambulatory Visit: Payer: Self-pay | Admitting: Adult Health

## 2020-03-22 LAB — ANTI-SMOOTH MUSCLE ANTIBODY, IGG: Actin (Smooth Muscle) Antibody (IGG): <20 U (ref <20)

## 2020-03-22 LAB — ANTI-NUCLEAR AB-TITER (ANA TITER): ANA Titer 1: 1:40 {titer} — ABNORMAL HIGH

## 2020-03-22 LAB — IGG: IgG (Immunoglobin G), Serum: 850 mg/dL (ref 600–1540)

## 2020-03-22 LAB — ANA: Anti Nuclear Antibody (ANA): POSITIVE — AB

## 2020-03-22 LAB — MITOCHONDRIAL ANTIBODIES: Mitochondrial M2 Ab, IgG: <=20 U

## 2020-03-24 ENCOUNTER — Other Ambulatory Visit: Payer: Self-pay

## 2020-03-24 DIAGNOSIS — R748 Abnormal levels of other serum enzymes: Secondary | ICD-10-CM

## 2020-03-27 ENCOUNTER — Other Ambulatory Visit: Payer: Self-pay | Admitting: Student

## 2020-03-28 ENCOUNTER — Encounter (HOSPITAL_COMMUNITY): Payer: Self-pay

## 2020-03-28 ENCOUNTER — Ambulatory Visit (HOSPITAL_COMMUNITY)
Admission: RE | Admit: 2020-03-28 | Discharge: 2020-03-28 | Disposition: A | Discharge status: Home / Self Care | Payer: Medicare PPO | Source: Ambulatory Visit | Attending: Internal Medicine | Admitting: Internal Medicine

## 2020-03-28 ENCOUNTER — Other Ambulatory Visit: Payer: Self-pay | Admitting: Physician Assistant

## 2020-03-28 ENCOUNTER — Other Ambulatory Visit: Payer: Self-pay

## 2020-03-28 DIAGNOSIS — Z79890 Hormone replacement therapy (postmenopausal): Secondary | ICD-10-CM | POA: Diagnosis not present

## 2020-03-28 DIAGNOSIS — Z801 Family history of malignant neoplasm of trachea, bronchus and lung: Secondary | ICD-10-CM | POA: Diagnosis not present

## 2020-03-28 DIAGNOSIS — R748 Abnormal levels of other serum enzymes: Secondary | ICD-10-CM | POA: Diagnosis not present

## 2020-03-28 DIAGNOSIS — Z7982 Long term (current) use of aspirin: Secondary | ICD-10-CM | POA: Diagnosis not present

## 2020-03-28 DIAGNOSIS — Z8673 Personal history of transient ischemic attack (TIA), and cerebral infarction without residual deficits: Secondary | ICD-10-CM | POA: Insufficient documentation

## 2020-03-28 DIAGNOSIS — Z803 Family history of malignant neoplasm of breast: Secondary | ICD-10-CM | POA: Insufficient documentation

## 2020-03-28 DIAGNOSIS — Z87891 Personal history of nicotine dependence: Secondary | ICD-10-CM | POA: Insufficient documentation

## 2020-03-28 DIAGNOSIS — K7689 Other specified diseases of liver: Secondary | ICD-10-CM | POA: Diagnosis not present

## 2020-03-28 DIAGNOSIS — K759 Inflammatory liver disease, unspecified: Secondary | ICD-10-CM | POA: Diagnosis not present

## 2020-03-28 DIAGNOSIS — R945 Abnormal results of liver function studies: Secondary | ICD-10-CM | POA: Diagnosis not present

## 2020-03-28 DIAGNOSIS — Z79899 Other long term (current) drug therapy: Secondary | ICD-10-CM | POA: Insufficient documentation

## 2020-03-28 LAB — CBC
HCT: 40.7 % (ref 36.0–46.0)
Hemoglobin: 13.1 g/dL (ref 12.0–15.0)
MCH: 29.1 pg (ref 26.0–34.0)
MCHC: 32.2 g/dL (ref 30.0–36.0)
MCV: 90.4 fL (ref 80.0–100.0)
Platelets: 208 10*3/uL (ref 150–400)
RBC: 4.5 MIL/uL (ref 3.87–5.11)
RDW: 12.9 % (ref 11.5–15.5)
WBC: 7.3 10*3/uL (ref 4.0–10.5)
nRBC: 0 % (ref 0.0–0.2)

## 2020-03-28 LAB — PROTIME-INR
INR: 1 (ref 0.8–1.2)
Prothrombin Time: 12.6 s (ref 11.4–15.2)

## 2020-03-28 MED ORDER — LIDOCAINE HCL (PF) 1 % IJ SOLN
INTRAMUSCULAR | Status: AC
  Filled 2020-03-28: qty 30

## 2020-03-28 MED ORDER — ONDANSETRON HCL 4 MG/2ML IJ SOLN: 4.0000 mg | Freq: Once | INTRAMUSCULAR | Status: AC

## 2020-03-28 MED ORDER — FENTANYL CITRATE (PF) 100 MCG/2ML IJ SOLN
INTRAMUSCULAR | Status: AC | PRN
Start: 2020-03-28 — End: 2020-03-28
  Administered 2020-03-28 (×2): 50 ug via INTRAVENOUS

## 2020-03-28 MED ORDER — MIDAZOLAM HCL 2 MG/2ML IJ SOLN
INTRAMUSCULAR | Status: AC | PRN
  Administered 2020-03-28 (×2): 1 mg via INTRAVENOUS

## 2020-03-28 MED ORDER — MIDAZOLAM HCL 2 MG/2ML IJ SOLN
INTRAMUSCULAR | Status: AC
  Filled 2020-03-28: qty 2

## 2020-03-28 MED ORDER — GELATIN ABSORBABLE 12-7 MM EX MISC
CUTANEOUS | Status: AC
  Filled 2020-03-28: qty 1

## 2020-03-28 MED ORDER — FENTANYL CITRATE (PF) 100 MCG/2ML IJ SOLN
INTRAMUSCULAR | Status: AC
  Filled 2020-03-28: qty 2

## 2020-03-28 MED ORDER — ONDANSETRON HCL 4 MG/2ML IJ SOLN
INTRAMUSCULAR | Status: AC
  Administered 2020-03-28: 4 mg via INTRAVENOUS
  Filled 2020-03-28: qty 2

## 2020-03-28 NOTE — Procedures (Signed)
Interventional Radiology Procedure Note  Procedure: US Guided Biopsy of liver  Complications: None  Estimated Blood Loss: < 10 mL  Findings: 18 G core biopsy of liver performed under US guidance.  Two core samples obtained and sent to Pathology.  Cevin Rubinstein T. Lynsay Fesperman, M.D Pager:  [phone]    

## 2020-03-28 NOTE — H&P (Addendum)
Chief Complaint: Patient was seen in consultation today for liver biopsy at the request of Gessner,Carl E  Referring Physician(s): Silvano Rusk E  Supervising Physician: Aletta Edouard  Patient Status: [facility] - Out-pt  History of Present Illness: Jaclyn Lopez is a 69 y.o. female being worked up for elevated liver enzymes. She is referred for image guided random liver biopsy. PMHx, meds, labs, imaging, allergies reviewed. Feels well, no recent fevers, chills, illness. Has been NPO today as directed.   Past Medical History:  Diagnosis Date  . Atypical glandular cells on Pap smear 2003  . 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 CKD (chronic kidney disease)   . CVA (cerebral vascular accident) ([place])   . Depression   . GERD (gastroesophageal reflux disease)   . Herpes progenitalis   . 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 Hyperlipidemia   . Hyperthyroidism   . Hypothyroidism   . IBS (irritable bowel syndrome)   . IC (interstitial cystitis)   . Insomnia   . 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 Subsequent LEEP showed CIN-2 with clear margins and negative ECC  . Osteopenia 11/2018   T score -1.5 FRAX 9% / 1% improved at both hips stable at spine  . Stroke ([place]) 11/2019  . Thyroid disease    Hyperthyroid    Past Surgical History:  Procedure Laterality Date  . Bladder stretch and Bx  1990  . BREAST LUMPECTOMY  2005   right; cancer  . BREAST LUMPECTOMY  2006   left; pre cancer  . BREAST SURGERY     Reduction  . BUNIONECTOMY  2007, 2011  . CERVICAL CONE BIOPSY  2003  . CESAREAN SECTION  1983  . DILATION AND CURETTAGE OF UTERUS  2009  . ENDOMETRIAL ABLATION     Novasure  . GANGLION CYST EXCISION Left 1985  . HYSTEROSCOPY  2009  . LEEP  05/2015   CIN-2 with clear margins  . MASTECTOMY,  PARTIAL    . MOUTH SURGERY  2012   implants  . TUBAL LIGATION  1984    Allergies: Symmetrel [amantadine hcl] and Tamiflu [oseltamivir phosphate]  Medications: Prior to Admission medications   Medication Sig Start Date End Date Taking? Authorizing Provider  ARTIFICIAL TEAR SOLUTION OP Place 1 drop into both eyes daily as needed (dry eyes).   Yes [provider]  aspirin EC 81 MG EC tablet Take 1 tablet (81 mg total) by mouth daily. Swallow whole. 12/01/19  Yes Nolberto Hanlon, MD  atorvastatin (LIPITOR) 80 MG tablet TAKE 1 TABLET BY MOUTH EVERY DAY Patient taking differently: Take 80 mg by mouth daily.  03/25/20  Yes McCue, Janett Billow, NP  Biotin (BIOTIN 5000) 5 MG CAPS Take 5 mg by mouth daily.   Yes [provider]  buPROPion (WELLBUTRIN XL) 150 MG 24 hr tablet Take 150 mg by mouth every morning. 11/28/19  Yes [provider]  calcium carbonate (TUMS - DOSED IN MG ELEMENTAL CALCIUM) 500 MG chewable tablet Chew 1 tablet by mouth daily as needed for indigestion or heartburn.   Yes [provider]  cholecalciferol (VITAMIN D3) 25 MCG (1000 UNIT) tablet Take 1,000 Units by mouth daily.   Yes [provider]  FLUoxetine (PROZAC) 20 MG capsule Take 20 mg by mouth daily.    Yes [provider]  ibuprofen (  ADVIL) 200 MG tablet Take 200 mg by mouth every 6 (six) hours as needed for headache or moderate pain.   Yes [provider]  levothyroxine (SYNTHROID, LEVOTHROID) 50 MCG tablet Take 50 mcg by mouth daily.    Yes [provider]  mirtazapine (REMERON) 15 MG tablet Take 3.75 mg by mouth at bedtime.    Yes [provider]  omeprazole (PRILOSEC) 40 MG capsule Take 40 mg by mouth daily as needed (acid reflux).   Yes [provider]  zaleplon (SONATA) 10 MG capsule Take 10 mg by mouth at bedtime as needed for sleep. 11/28/19  Yes [provider]  Methen-Hyosc-Meth Blue-Na Phos (ME/NAPHOS/MB/HYO1) 81.6 MG TABS  Take 1 tablet by mouth daily as needed (urinary pain).    [provider]     Family History  Problem Relation Age of Onset  . COPD Mother   . Dementia Mother   . Bipolar disorder Mother   . Lung cancer Father   . Cancer - Lung Father   . Breast cancer Maternal Grandmother        Age 54's  . Colon cancer Neg Hx   . Stomach cancer Neg Hx   . Pancreatic cancer Neg Hx   . Esophageal cancer Neg Hx   . Rectal cancer Neg Hx     Social History   Socioeconomic History  . Marital status: Divorced    Spouse name: Not on file  . Number of children: 2  . Years of education: Not on file  . Highest education level: Not on file  Occupational History  . Occupation: Pharmacist, hospital    Comment: [employer]  Tobacco Use  . Smoking status: Former Smoker    Types: Cigarettes    Quit date: 05/17/1996    Years since quitting: 23.8  . Smokeless tobacco: Never Used  Vaping Use  . Vaping Use: Never used  Substance and Sexual Activity  . Alcohol use: Yes    Alcohol/week: 0.0 standard drinks    Comment: once a month  . Drug use: No  . Sexual activity: Never    Birth control/protection: Surgical    Comment: 1st intercourse 69 yo-More than 5 partners  Other Topics Concern  . 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 Difficulty of Paying Living Expenses: Not on file  Food Insecurity:   . Worried About Charity fundraiser in the Last Year: Not on file  . Ran Out of Food in the Last Year: Not on file  Transportation Needs:   . Lack of Transportation (Medical): Not on file  . Lack of Transportation (Non-Medical): Not on file  Physical Activity:   . Days of Exercise per Week: Not on file  . Minutes of Exercise per Session: Not on file  Stress:   . Feeling of Stress : Not on file  Social Connections:   . Frequency of Communication with Friends  and Family: Not on file  . Frequency of Social Gatherings with Friends and Family: Not on file  . Attends Religious Services: Not on file  . Active Member of Clubs or Organizations: Not on file  . Attends Archivist Meetings: Not on file  . Marital Status: Not on file     Review of Systems: A 12 point ROS discussed and pertinent positives are  indicated in the HPI above.  All other systems are negative.  Review of Systems  Vital Signs: BP 118/65   Pulse 79   Temp 98 F (36.7 C) (Oral)   Ht 5' 5.5" (1.664 m)   Wt 63 kg   SpO2 100%   BMI 22.78 kg/m   Physical Exam Constitutional:      Appearance: Normal appearance.  HENT:     Mouth/Throat:     Mouth: Mucous membranes are moist.     Pharynx: Oropharynx is clear.  Cardiovascular:     Rate and Rhythm: Normal rate and regular rhythm.     Heart sounds: Normal heart sounds.  Pulmonary:     Effort: Pulmonary effort is normal. No respiratory distress.     Breath sounds: Normal breath sounds.  Skin:    General: Skin is warm and dry.  Neurological:     General: No focal deficit present.     Mental Status: She is alert and oriented to person, place, and time.  Psychiatric:        Mood and Affect: Mood normal.        Thought Content: Thought content normal.        Judgment: Judgment normal.       Imaging: No results found.  Labs:  CBC: Recent Labs    11/29/19 1034 03/18/20 1518 03/28/20 1121  WBC 7.3 7.2 7.3  HGB 13.9 13.3 13.1  HCT 43.3 39.8 40.7  PLT 232 197.0 208    COAGS: Recent Labs    11/29/19 1034 03/28/20 1121  INR 1.0 1.0  APTT 26  --     Assessment and Plan: Elevated liver enzymes For US guided random liver biopsy Labs ok Risks and benefits of liver biopsy was discussed with the patient and/or patient's family including, but not limited to bleeding, infection, damage to adjacent structures or low yield requiring additional tests.  All of the questions were answered and there is  agreement to proceed.  Consent signed and in chart.    Thank you for this interesting consult.  I greatly enjoyed meeting Jaclyn Lopez and look forward to participating in their care.  A copy of this report was sent to the requesting provider on this date.  Electronically Signed: Ascencion Dike, PA-C 03/28/2020, 12:18 PM   I spent a total of 20 minutes in face to face in clinical consultation, greater than 50% of which was counseling/coordinating care for liver biopsy

## 2020-03-28 NOTE — Discharge Instructions (Addendum)
Liver Biopsy, Care After These instructions give you information on caring for yourself after your procedure. Your doctor may also give you more specific instructions. Call your doctor if you have any problems or questions after your procedure. What can I expect after the procedure? After the procedure, it is common to have:  Pain and soreness where the biopsy was done.  Bruising around the area where the biopsy was done.  Sleepiness and be tired for a few days. Follow these instructions at home: Medicines  Take over-the-counter and prescription medicines only as told by your doctor.  If you were prescribed an antibiotic medicine, take it as told by your doctor. Do not stop taking the antibiotic even if you start to feel better.  Do not take medicines such as aspirin and ibuprofen. These medicines can thin your blood. Do not take these medicines unless your doctor tells you to take them.  If you are taking prescription pain medicine, take actions to prevent or treat constipation. Your doctor may recommend that you: ? Drink enough fluid to keep your pee (urine) clear or pale yellow. ? Take over-the-counter or prescription medicines. ? Eat foods that are high in fiber, such as fresh fruits and vegetables, whole grains, and beans. ? Limit foods that are high in fat and processed sugars, such as fried and sweet foods. Caring for your cut  Follow instructions from your doctor about how to take care of your cuts from surgery (incisions). Make sure you: ? Wash your hands with soap and water before you change your bandage (dressing). If you cannot use soap and water, use hand sanitizer. ? Change your bandage as told by your doctor. ? Leave stitches (sutures), skin glue, or skin tape (adhesive) strips in place. They may need to stay in place for 2 weeks or longer. If tape strips get loose and curl up, you may trim the loose edges. Do not remove tape strips completely unless your doctor says it is  okay.  Check your cuts every day for signs of infection. Check for: ? Redness, swelling, or more pain. ? Fluid or blood. ? Pus or a bad smell. ? Warmth.  Do not take baths, swim, or use a hot tub until your doctor says it is okay to do so. Activity   Rest at home for 1-2 days or as told by your doctor. ? Avoid sitting for a long time without moving. Get up to take short walks every 1-2 hours.  Return to your normal activities as told by your doctor. Ask what activities are safe for you.  Do not do these things in the first 24 hours: ? Drive. ? Use machinery. ? Take a bath or shower.  Do not lift more than 10 pounds (4.5 kg) or play contact sports for the first 2 weeks. General instructions   Do not drink alcohol in the first week after the procedure.  Have someone stay with you for at least 24 hours after the procedure.  Get your test results. Ask your doctor or the department that is doing the test: ? When will my results be ready? ? How will I get my results? ? What are my treatment options? ? What other tests do I need? ? What are my next steps?  Keep all follow-up visits as told by your doctor. This is important. Contact a doctor if:  A cut bleeds and leaves more than just a small spot of blood.  A cut is red, puffs up (  swells), or hurts more than before.  Fluid or something else comes from a cut.  A cut smells bad.  You have a fever or chills. Get help right away if:  You have swelling, bloating, or pain in your belly (abdomen).  You get dizzy or faint.  You have a rash.  You feel sick to your stomach (nauseous) or throw up (vomit).  You have trouble breathing, feel short of breath, or feel faint.  Your chest hurts.  You have problems talking or seeing.  You have trouble with your balance or moving your arms or legs. Summary  After the procedure, it is common to have pain, soreness, bruising, and tiredness.  Your doctor will tell you how to  take care of yourself at home. Change your bandage, take your medicines, and limit your activities as told by your doctor.  Call your doctor if you have symptoms of infection. Get help right away if your belly swells, your cut bleeds a lot, or you have trouble talking or breathing. This information is not intended to replace advice given to you by your health care provider. Make sure you discuss any questions you have with your health care provider. Document Revised: 05/13/2017 Document Reviewed: 05/13/2017 Elsevier Patient Education  2020 Elsevier Inc. Moderate Conscious Sedation, Adult Sedation is the use of medicines to promote relaxation and relieve discomfort and anxiety. Moderate conscious sedation is a type of sedation. Under moderate conscious sedation, you are less alert than normal, but you are still able to respond to instructions, touch, or both. Moderate conscious sedation is used during short medical and dental procedures. It is milder than deep sedation, which is a type of sedation under which you cannot be easily woken up. It is also milder than general anesthesia, which is the use of medicines to make you unconscious. Moderate conscious sedation allows you to return to your regular activities sooner. Tell a health care provider about:  Any allergies you have.  All medicines you are taking, including vitamins, herbs, eye drops, creams, and over-the-counter medicines.  Use of steroids (by mouth or creams).  Any problems you or family members have had with sedatives and anesthetic medicines.  Any blood disorders you have.  Any surgeries you have had.  Any medical conditions you have, such as sleep apnea.  Whether you are pregnant or may be pregnant.  Any use of cigarettes, alcohol, marijuana, or street drugs. What are the risks? Generally, this is a safe procedure. However, problems may occur, including:  Getting too much medicine (oversedation).  Nausea.  Allergic  reaction to medicines.  Trouble breathing. If this happens, a breathing tube may be used to help with breathing. It will be removed when you are awake and breathing on your own.  Heart trouble.  Lung trouble. What happens before the procedure? Staying hydrated Follow instructions from your health care provider about hydration, which may include:  Up to 2 hours before the procedure - you may continue to drink clear liquids, such as water, clear fruit juice, black coffee, and plain tea. Eating and drinking restrictions Follow instructions from your health care provider about eating and drinking, which may include:  8 hours before the procedure - stop eating heavy meals or foods such as meat, fried foods, or fatty foods.  6 hours before the procedure - stop eating light meals or foods, such as toast or cereal.  6 hours before the procedure - stop drinking milk or drinks that contain milk.  2   hours before the procedure - stop drinking clear liquids. Medicine Ask your health care provider about:  Changing or stopping your regular medicines. This is especially important if you are taking diabetes medicines or blood thinners.  Taking medicines such as aspirin and ibuprofen. These medicines can thin your blood. Do not take these medicines before your procedure if your health care provider instructs you not to.  Tests and exams  You will have a physical exam.  You may have blood tests done to show: ? How well your kidneys and liver are working. ? How well your blood can clot. General instructions  Plan to have someone take you home from the hospital or clinic.  If you will be going home right after the procedure, plan to have someone with you for 24 hours. What happens during the procedure?  An IV tube will be inserted into one of your veins.  Medicine to help you relax (sedative) will be given through the IV tube.  The medical or dental procedure will be performed. What  happens after the procedure?  Your blood pressure, heart rate, breathing rate, and blood oxygen level will be monitored often until the medicines you were given have worn off.  Do not drive for 24 hours. This information is not intended to replace advice given to you by your health care provider. Make sure you discuss any questions you have with your health care provider. Document Revised: 04/15/2017 Document Reviewed: 08/23/2015 Elsevier Patient Education  2020 Elsevier Inc.  

## 2020-04-01 LAB — SURGICAL PATHOLOGY

## 2020-04-03 ENCOUNTER — Telehealth: Payer: Self-pay

## 2020-04-03 ENCOUNTER — Other Ambulatory Visit: Payer: Self-pay | Admitting: Internal Medicine

## 2020-04-03 DIAGNOSIS — R748 Abnormal levels of other serum enzymes: Secondary | ICD-10-CM

## 2020-04-03 NOTE — Progress Notes (Signed)
lft

## 2020-04-03 NOTE — Telephone Encounter (Signed)
Received documentation from [employer] that Pt has requested a cancellation of monitoring service.   Order Cancelled...  

## 2020-04-14 ENCOUNTER — Encounter: Payer: Self-pay | Admitting: Adult Health

## 2020-04-14 ENCOUNTER — Ambulatory Visit: Payer: Medicare PPO | Admitting: Adult Health

## 2020-04-14 VITALS — BP 120/62 | HR 82 | Ht 65.0 in | Wt 145.0 lb

## 2020-04-14 DIAGNOSIS — Q211 Atrial septal defect: Secondary | ICD-10-CM | POA: Diagnosis not present

## 2020-04-14 DIAGNOSIS — E785 Hyperlipidemia, unspecified: Secondary | ICD-10-CM | POA: Diagnosis not present

## 2020-04-14 DIAGNOSIS — I639 Cerebral infarction, unspecified: Secondary | ICD-10-CM

## 2020-04-14 DIAGNOSIS — Q2112 Patent foramen ovale: Secondary | ICD-10-CM

## 2020-04-14 NOTE — Progress Notes (Signed)
I agree with the above plan 

## 2020-04-14 NOTE — Patient Instructions (Addendum)
Continue aspirin 81 mg daily  and atrovastatin  for secondary stroke prevention  Continue to follow up with PCP regarding cholesterol management  Maintain strict control of cholesterol with LDL cholesterol (bad cholesterol) goal below 70 mg/dL.   Continue to follow with GI for abnormal liver function tests  Continue to monitor your dizziness/lightheadedness episodes - sounds more related to blood pressure which can fluctuate when changing positions which can cause short duration of dizziness/lightheadedness. If symptoms persist or worsen, recommend further evaluation by your PCP     Followup in the future with me in 6 months or call earlier if needed       Thank you for coming to see [place] at [facility]. I hope we have been able to provide you high quality care today.  You may receive a patient satisfaction survey over the next few weeks. We would appreciate your feedback and comments so that we may continue to improve ourselves and the health of our patients.     Dizziness Dizziness is a common problem. It makes you feel unsteady or light-headed. You may feel like you are about to pass out (faint). Dizziness can lead to getting hurt if you stumble or fall. Dizziness can be caused by many things, including:  Medicines.  Not having enough water in your body (dehydration).  Illness. Follow these instructions at home: Eating and drinking   Drink enough fluid to keep your pee (urine) clear or pale yellow. This helps to keep you from getting dehydrated. Try to drink more clear fluids, such as water.  Do not drink alcohol.  Limit how much caffeine you drink or eat, if your doctor tells you to do that.  Limit how much salt (sodium) you drink or eat, if your doctor tells you to do that. Activity   Avoid making quick movements. ? When you stand up from sitting in a chair, steady yourself until you feel okay. ? In the morning, first sit up on the side of the  bed. When you feel okay, stand slowly while you hold onto something. Do this until you know that your balance is fine.  If you need to stand in one place for a long time, move your legs often. Tighten and relax the muscles in your legs while you are standing.  Do not drive or use heavy machinery if you feel dizzy.  Avoid bending down if you feel dizzy. Place items in your home so you can reach them easily without leaning over. Lifestyle  Do not use any products that contain nicotine or tobacco, such as cigarettes and e-cigarettes. If you need help quitting, ask your doctor.  Try to lower your stress level. You can do this by using methods such as yoga or meditation. Talk with your doctor if you need help. General instructions  Watch your dizziness for any changes.  Take over-the-counter and prescription medicines only as told by your doctor. Talk with your doctor if you think that you are dizzy because of a medicine that you are taking.  Tell a friend or a family member that you are feeling dizzy. If he or she notices any changes in your behavior, have this person call your doctor.  Keep all follow-up visits as told by your doctor. This is important. Contact a doctor if:  Your dizziness does not go away.  Your dizziness or light-headedness gets worse.  You feel sick to your stomach (nauseous).  You have trouble hearing.  You have new symptoms.  You are unsteady on your feet.  You feel like the room is spinning. Get help right away if:  You throw up (vomit) or have watery poop (diarrhea), and you cannot eat or drink anything.  You have trouble: ? Talking. ? Walking. ? Swallowing. ? Using your arms, hands, or legs.  You feel generally weak.  You are not thinking clearly, or you have trouble forming sentences. A friend or family member may notice this.  You have: ? Chest pain. ? Pain in your belly (abdomen). ? Shortness of breath. ? Sweating.  Your vision  changes.  You are bleeding.  You have a very bad headache.  You have neck pain or a stiff neck.  You have a fever. These symptoms may be an emergency. Do not wait to see if the symptoms will go away. Get medical help right away. Call your local emergency services (911 in the [place] Do not drive yourself to the hospital. Summary  Dizziness makes you feel unsteady or light-headed. You may feel like you are about to pass out (faint).  Drink enough fluid to keep your pee (urine) clear or pale yellow. Do not drink alcohol.  Avoid making quick movements if you feel dizzy.  Watch your dizziness for any changes. This information is not intended to replace advice given to you by your health care provider. Make sure you discuss any questions you have with your health care provider. Document Revised: 05/06/2017 Document Reviewed: 05/20/2016 Elsevier Patient Education  [place].

## 2020-04-14 NOTE — Progress Notes (Signed)
[facility] [address] [place]. [place] [address] [phone]       STROKE FOLLOW UP NOTE  Ms. [name] Date of Birth:  [birth year] Medical Record Number:  [id number]   Reason for Referral: stroke follow up    SUBJECTIVE:   CHIEF COMPLAINT:  Chief Complaint  Patient presents with  . Follow-up    RM 9  . Cerebrovascular Accident    Pt said she has no new sx since her last visit.    HPI:   [name] is a 69 y.o. female w/pmh of CKD, breast cancer, hypothyroidism, depression, who presented on 11/29/2019 with slurred speech and left facial droop.  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  MRA head/neck unremarkable.  2D echo EF of 55 to 60%.  Recommended DAPT for 3 weeks and aspirin alone.  LDL 155 and initiate atorvastatin 80 mg daily.  No history of HTN or DM with A1c 5.3.  Other stroke risk factors include prior stroke on imaging (L frontal cortical infarct).  Evaluated by therapy without therapy needs and discharged home in stable condition  Today, 04/14/2020, [name] returns for 81-month stroke follow-up.  Reports residual occasional mild speech difficulty greater when tired but almost back to baseline. Denies residual left sided weakness.  She does report occasional lightheadedness sensation typically occurring when standing up too quickly.  Denies any other new or worsening stroke/TIA symptoms.  Remains on aspirin without bleeding or bruising.  Remains on atorvastatin 40 mg daily without myalgias. Pt reports decreasing dose after recent satisfactory lipid panel (unable to personally view via epic).  Evaluated by GI for abnormal liver function tests with liver biopsy unremarkable. Plans on repeating lab work around 12/20.  No further concerns at this time.    ROS:   14 system review of systems performed and negative with exception of those listed in HPI  PMH:   Past Medical History:  Diagnosis Date  . Atypical glandular cells on Pap smear 2003  . 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 CKD (chronic kidney disease)   . CVA (cerebral vascular accident) ([place])   . Depression   . GERD (gastroesophageal reflux disease)   . Herpes progenitalis   . 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 Hyperlipidemia   . Hyperthyroidism   . Hypothyroidism   . IBS (irritable bowel syndrome)   . IC (interstitial cystitis)   . Insomnia   . 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 Subsequent LEEP showed CIN-2 with clear margins and negative ECC  . Osteopenia 11/2018   T score -1.5 FRAX 9% / 1% improved at both hips stable at spine  . Stroke ([place]) 11/2019  . Thyroid disease    Hyperthyroid    PSH:  Past Surgical History:  Procedure Laterality Date  . Bladder stretch and Bx  1990  . BREAST LUMPECTOMY  2005   right; cancer  . BREAST LUMPECTOMY  2006   left; pre cancer  . BREAST SURGERY     Reduction  . BUNIONECTOMY  2007, 2011  . CERVICAL CONE BIOPSY  2003  . CESAREAN SECTION  1983  . DILATION AND CURETTAGE OF UTERUS  2009  . ENDOMETRIAL ABLATION     Novasure  . GANGLION CYST EXCISION Left 1985  .  HYSTEROSCOPY  2009  . LEEP  05/2015   CIN-2 with clear margins  . MASTECTOMY, PARTIAL    . MOUTH SURGERY  2012   implants  . TUBAL LIGATION  1984    Social History:  Social History   Socioeconomic History  . Marital status: Divorced    Spouse name: Not on file  . Number of children: 2  . Years of education: Not on file  . Highest education level: Not on file  Occupational History  . Occupation: Pharmacist, hospital    Comment: [employer]  Tobacco Use  . Smoking status: Former Smoker    Types: Cigarettes    Quit date: 05/17/1996    Years since quitting: 23.9  . Smokeless tobacco: Never Used   Vaping Use  . Vaping Use: Never used  Substance and Sexual Activity  . Alcohol use: Yes    Alcohol/week: 0.0 standard drinks    Comment: once a month  . Drug use: No  . Sexual activity: Never    Birth control/protection: Surgical    Comment: 1st intercourse 69 yo-More than 5 partners  Other Topics Concern  . 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 Difficulty of Paying Living Expenses: Not on file  Food Insecurity:   . Worried About Charity fundraiser in the Last Year: Not on file  . Ran Out of Food in the Last Year: Not on file  Transportation Needs:   . Lack of Transportation (Medical): Not on file  . Lack of Transportation (Non-Medical): Not on file  Physical Activity:   . Days of Exercise per Week: Not on file  . Minutes of Exercise per Session: Not on file  Stress:   . Feeling of Stress : Not on file  Social Connections:   . Frequency of Communication with Friends and Family: Not on file  . Frequency of Social Gatherings with Friends and Family: Not on file  . Attends Religious Services: Not on file  . Active Member of Clubs or Organizations: Not on file  . Attends Archivist Meetings: Not on file  . Marital Status: Not on file  Intimate Partner Violence:   . Fear of Current or Ex-Partner: Not on file  . Emotionally Abused: Not on file  . Physically Abused: Not on file  . Sexually Abused: Not on file    Family History:  Family History  Problem Relation Age of Onset  . COPD Mother   . Dementia Mother   . Bipolar disorder Mother   . Lung cancer Father   . Cancer - Lung Father   . Breast cancer Maternal Grandmother        Age 16's  . Colon cancer Neg Hx   . Stomach cancer Neg Hx   . Pancreatic cancer Neg Hx   . Esophageal cancer Neg Hx   . Rectal cancer Neg Hx     Medications:   Current  Outpatient Medications on File Prior to Visit  Medication Sig Dispense Refill  . ARTIFICIAL TEAR SOLUTION OP Place 1 drop into both eyes daily as needed (dry eyes).    Marland Kitchen aspirin EC 81 MG EC tablet Take 1 tablet (81 mg total) by mouth daily. Swallow whole. 30 tablet 3  . atorvastatin (LIPITOR) 80 MG tablet TAKE 1 TABLET BY MOUTH EVERY DAY (Patient  taking differently: Take 40 mg by mouth daily. ) 90 tablet 0  . Biotin (BIOTIN 5000) 5 MG CAPS Take 5 mg by mouth daily.    Marland Kitchen buPROPion (WELLBUTRIN XL) 150 MG 24 hr tablet Take 150 mg by mouth every morning.    . calcium carbonate (TUMS - DOSED IN MG ELEMENTAL CALCIUM) 500 MG chewable tablet Chew 1 tablet by mouth daily as needed for indigestion or heartburn.    . cholecalciferol (VITAMIN D3) 25 MCG (1000 UNIT) tablet Take 1,000 Units by mouth daily.    Marland Kitchen FLUoxetine (PROZAC) 20 MG capsule Take 20 mg by mouth daily.     Marland Kitchen ibuprofen (ADVIL) 200 MG tablet Take 200 mg by mouth every 6 (six) hours as needed for headache or moderate pain.    Marland Kitchen levothyroxine (SYNTHROID, LEVOTHROID) 50 MCG tablet Take 50 mcg by mouth daily.     . Methen-Hyosc-Meth Blue-Na Phos (ME/NAPHOS/MB/HYO1) 81.6 MG TABS Take 1 tablet by mouth daily as needed (urinary pain).    . mirtazapine (REMERON) 15 MG tablet Take 3.75 mg by mouth at bedtime.     Marland Kitchen omeprazole (PRILOSEC) 40 MG capsule Take 40 mg by mouth daily as needed (acid reflux).    . zaleplon (SONATA) 10 MG capsule Take 10 mg by mouth at bedtime as needed for sleep.     No current facility-administered medications on file prior to visit.    Allergies:   Allergies  Allergen Reactions  . Symmetrel [Amantadine Hcl] Hives  . Tamiflu [Oseltamivir Phosphate] Hives      OBJECTIVE:  Physical Exam  Vitals:   04/14/20 1106  BP: 120/62  Pulse: 82  Weight: 145 lb (65.8 kg)  Height: 5\' 5"  (1.651 m)   Body mass index is 24.13 kg/m. No exam data present  General: well developed, well nourished, pleasant middle-age  Caucasian female, seated, in no evident distress Head: head normocephalic and atraumatic.   Neck: supple with no carotid or supraclavicular bruits Cardiovascular: regular rate and rhythm, no murmurs Musculoskeletal: no deformity Skin:  no rash/petichiae Vascular:  Normal pulses all extremities   Neurologic Exam Mental Status: Awake and fully alert.   Fluent speech and language. Oriented to place and time. Recent and remote memory intact. Attention span, concentration and fund of knowledge appropriate. Mood and affect appropriate.  Cranial Nerves: Pupils equal, briskly reactive to light. Extraocular movements full without nystagmus. Visual fields full to confrontation. Hearing intact. Facial sensation intact.  Mild Left lower facial weakness.  Tongue, and palate moves normally and symmetrically.  Motor: Normal bulk and tone. Normal strength in all tested extremity muscles except mild left hip flexor weakness. Sensory.: intact to touch , pinprick , position and vibratory sensation.  Coordination: Rapid alternating movements normal in all extremities. Finger-to-nose and heel-to-shin performed accurately bilaterally.  Gait and Station: Arises from chair without difficulty. Stance is normal. Gait demonstrates normal stride length and balance without use of assistive device Reflexes: 1+ and symmetric. Toes downgoing.        ASSESSMENT: Jaclyn Lopez is a 69 y.o. year old female presented with left facial droop and dysarthria on 11/29/2019 with stroke work-up revealing right frontal lobe acute infarct secondary to small vessel disease. Vascular risk factors include prior stroke on imaging, HLD and PFO.     PLAN:  1. R frontal stroke :  a. Residual deficit: Occasional dysarthria, mild hip flexor weakness, and mild left lower facial weakness.  Reports ongoing improvement since prior visit b. Continue aspirin 81 mg  daily  and atorvastatin 40 mg daily for secondary stroke prevention.   c. Discussed secondary stroke prevention measures and importance of close PCP follow up for aggressive stroke risk factor management  2. HLD: LDL goal <70.  Recent lipid panel satisfactory per patient (unable to view via epic). On atorvastatin 40 mg daily per PCP 3. PFO: Incidental finding not correlating to current stroke    Follow up in 6 months or call earlier if needed  CC:  [place] provider: Dr. Neil Crouch, Thayer Jew, MD    I spent 30 minutes of face-to-face and non-face-to-face time with patient.  This included previsit chart review, lab review, study review, order entry, electronic health record documentation, patient education regarding prior stroke, residual deficits, importance of managing stroke risk factors and answered all questions to patient satisfaction    Frann Rider, [facility]  [facility] [address] [place], [place] [address]  Phone [phone] Fax [phone] Note: This document was prepared with digital dictation and possible smart phrase technology. Any transcriptional errors that result from this process are unintentional.

## 2020-05-05 ENCOUNTER — Other Ambulatory Visit: Payer: Self-pay | Admitting: Internal Medicine

## 2020-05-05 ENCOUNTER — Other Ambulatory Visit (INDEPENDENT_AMBULATORY_CARE_PROVIDER_SITE_OTHER): Payer: Medicare PPO

## 2020-05-05 DIAGNOSIS — R748 Abnormal levels of other serum enzymes: Secondary | ICD-10-CM

## 2020-05-05 LAB — HEPATIC FUNCTION PANEL
ALT: 22 U/L (ref 0–35)
AST: 21 U/L (ref 0–37)
Albumin: 4.2 g/dL (ref 3.5–5.2)
Alkaline Phosphatase: 120 U/L — ABNORMAL HIGH (ref 39–117)
Bilirubin, Direct: 0.1 mg/dL (ref 0.0–0.3)
Total Bilirubin: 0.5 mg/dL (ref 0.2–1.2)
Total Protein: 6.8 g/dL (ref 6.0–8.3)

## 2020-05-14 DIAGNOSIS — Z8673 Personal history of transient ischemic attack (TIA), and cerebral infarction without residual deficits: Secondary | ICD-10-CM | POA: Diagnosis not present

## 2020-05-15 DIAGNOSIS — Z8673 Personal history of transient ischemic attack (TIA), and cerebral infarction without residual deficits: Secondary | ICD-10-CM | POA: Diagnosis not present

## 2020-05-26 DIAGNOSIS — H2513 Age-related nuclear cataract, bilateral: Secondary | ICD-10-CM | POA: Diagnosis not present

## 2020-05-26 DIAGNOSIS — H25043 Posterior subcapsular polar age-related cataract, bilateral: Secondary | ICD-10-CM | POA: Diagnosis not present

## 2020-05-26 DIAGNOSIS — H2511 Age-related nuclear cataract, right eye: Secondary | ICD-10-CM | POA: Diagnosis not present

## 2020-05-26 DIAGNOSIS — H25013 Cortical age-related cataract, bilateral: Secondary | ICD-10-CM | POA: Diagnosis not present

## 2020-05-29 ENCOUNTER — Telehealth: Payer: Self-pay | Admitting: *Deleted

## 2020-05-29 NOTE — Telephone Encounter (Signed)
Received clearance form for [facility]. To JM/NP for signature.

## 2020-05-29 NOTE — Telephone Encounter (Signed)
Signed and placed in outbox.  Thank you. ?

## 2020-06-09 NOTE — Telephone Encounter (Signed)
Fax confirmation received.  [phone].

## 2020-06-17 HISTORY — PX: CATARACT EXTRACTION: SUR2

## 2020-06-23 DIAGNOSIS — H2513 Age-related nuclear cataract, bilateral: Secondary | ICD-10-CM | POA: Diagnosis not present

## 2020-06-24 DIAGNOSIS — H2511 Age-related nuclear cataract, right eye: Secondary | ICD-10-CM | POA: Diagnosis not present

## 2020-06-24 DIAGNOSIS — H25013 Cortical age-related cataract, bilateral: Secondary | ICD-10-CM | POA: Diagnosis not present

## 2020-06-24 DIAGNOSIS — H2512 Age-related nuclear cataract, left eye: Secondary | ICD-10-CM | POA: Diagnosis not present

## 2020-06-27 ENCOUNTER — Other Ambulatory Visit: Payer: Self-pay | Admitting: Adult Health

## 2020-06-30 DIAGNOSIS — H2513 Age-related nuclear cataract, bilateral: Secondary | ICD-10-CM | POA: Diagnosis not present

## 2020-07-01 DIAGNOSIS — H2512 Age-related nuclear cataract, left eye: Secondary | ICD-10-CM | POA: Diagnosis not present

## 2020-07-11 ENCOUNTER — Ambulatory Visit (HOSPITAL_COMMUNITY)
Admission: EM | Admit: 2020-07-11 | Discharge: 2020-07-11 | Disposition: A | Discharge status: Home / Self Care | Payer: Medicare PPO | Attending: Emergency Medicine | Admitting: Emergency Medicine

## 2020-07-11 ENCOUNTER — Other Ambulatory Visit: Payer: Self-pay

## 2020-07-11 ENCOUNTER — Encounter (HOSPITAL_COMMUNITY): Payer: Self-pay | Admitting: Emergency Medicine

## 2020-07-11 DIAGNOSIS — S51812A Laceration without foreign body of left forearm, initial encounter: Secondary | ICD-10-CM | POA: Diagnosis not present

## 2020-07-11 DIAGNOSIS — W5501XA Bitten by cat, initial encounter: Secondary | ICD-10-CM

## 2020-07-11 MED ORDER — BACITRACIN ZINC 500 UNIT/GM EX OINT
TOPICAL_OINTMENT | CUTANEOUS | Status: AC
  Filled 2020-07-11: qty 0.9

## 2020-07-11 MED ORDER — AMOXICILLIN-POT CLAVULANATE 875-125 MG PO TABS: 1.0000 | ORAL_TABLET | Freq: Two times a day (BID) | ORAL | 0 refills | Status: DC

## 2020-07-11 NOTE — ED Provider Notes (Signed)
[place]    CSN: [id number] Arrival date & time: 07/11/20  1552      History   Chief Complaint Chief Complaint  Patient presents with  . Animal Bite    HPI Jaclyn Lopez is a 70 y.o. female.   Patient presents with cat bites and scratches on her left forearm which occurred earlier today.  Bleeding controlled with direct pressure.  She denies numbness, weakness, paresthesias.  Patient's last tetanus was 2 years ago she states.  The cat is hers and she states the cat is up-to-date on her vaccinations.  Her medical history includes breast CA, diverticulosis, hypothyroidism, GERD, CVA, CKD, depression.  The history is provided by the patient and medical records.    Past Medical History:  Diagnosis Date  . Atypical glandular cells on Pap smear 2003  . 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 CKD (chronic kidney disease)   . CVA (cerebral vascular accident) ([place])   . Depression   . GERD (gastroesophageal reflux disease)   . Herpes progenitalis   . 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 Hyperlipidemia   . Hyperthyroidism   . Hypothyroidism   . IBS (irritable bowel syndrome)   . IC (interstitial cystitis)   . Insomnia   . 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 Subsequent LEEP showed CIN-2 with clear margins and negative ECC  . Osteopenia 11/2018   T score -1.5 FRAX 9% / 1% improved at both hips stable at spine  . Stroke ([place]) 11/2019  . Thyroid disease    Hyperthyroid    Patient Active Problem List   Diagnosis Date Noted  . CVA (cerebral vascular accident) ([place]) 11/29/2019  . Depression 11/29/2019  . CKD (chronic kidney disease) stage 3, GFR 30-59 ml/min (HCC) 11/29/2019  . Nausea and vomiting 03/15/2019  . Gastroesophageal reflux disease 03/15/2019  . Loose stools 03/15/2019  . Elevated  alkaline phosphatase level 03/15/2019  . Herpes progenitalis   . Osteopenia   . Hypothyroidism   . IC (interstitial cystitis)   . 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 DIVERTICULOSIS OF COLON 03/12/2008    Past Surgical History:  Procedure Laterality Date  . Bladder stretch and Bx  1990  . BREAST LUMPECTOMY  2005   right; cancer  . BREAST LUMPECTOMY  2006   left; pre cancer  . BREAST SURGERY     Reduction  . BUNIONECTOMY  2007, 2011  . CERVICAL CONE BIOPSY  2003  . CESAREAN SECTION  1983  . DILATION AND CURETTAGE OF UTERUS  2009  . ENDOMETRIAL ABLATION     Novasure  . GANGLION CYST EXCISION Left 1985  . HYSTEROSCOPY  2009  . LEEP  05/2015   CIN-2 with clear margins  . MASTECTOMY, PARTIAL    . MOUTH SURGERY  2012   implants  . TUBAL LIGATION  1984    OB History    Gravida  2   Para  2   Term  2   Preterm      AB      Living  2     SAB      IAB      Ectopic      Multiple      Live Births  Home Medications    Prior to Admission medications   Medication Sig Start Date End Date Taking? Authorizing Provider  amoxicillin-clavulanate (AUGMENTIN) 875-125 MG tablet Take 1 tablet by mouth every 12 (twelve) hours. 07/11/20  Yes Sharion Balloon, NP  ARTIFICIAL TEAR SOLUTION OP Place 1 drop into both eyes daily as needed (dry eyes).    [provider]  aspirin EC 81 MG EC tablet Take 1 tablet (81 mg total) by mouth daily. Swallow whole. 12/01/19   Nolberto Hanlon, MD  atorvastatin (LIPITOR) 80 MG tablet TAKE 1 TABLET BY MOUTH EVERY DAY 06/30/20   Frann Rider, NP  Biotin (BIOTIN 5000) 5 MG CAPS Take 5 mg by mouth daily.    [provider]  buPROPion (WELLBUTRIN XL) 150 MG 24 hr tablet Take 150 mg by mouth every morning. 11/28/19   [provider]  calcium carbonate (TUMS - DOSED IN MG ELEMENTAL CALCIUM) 500 MG chewable tablet Chew 1 tablet by mouth daily as needed for indigestion or heartburn.    [provider]  cholecalciferol (VITAMIN D3) 25 MCG (1000 UNIT) tablet Take 1,000 Units by mouth daily.    [provider]  FLUoxetine (PROZAC) 20 MG capsule Take 20 mg by mouth daily.     [provider]  ibuprofen (ADVIL) 200 MG tablet Take 200 mg by mouth every 6 (six) hours as needed for headache or moderate pain.    [provider]  levothyroxine (SYNTHROID, LEVOTHROID) 50 MCG tablet Take 50 mcg by mouth daily.     [provider]  Methen-Hyosc-Meth Blue-Na Phos (ME/NAPHOS/MB/HYO1) 81.6 MG TABS Take 1 tablet by mouth daily as needed (urinary pain).    [provider]  mirtazapine (REMERON) 15 MG tablet Take 3.75 mg by mouth at bedtime.     [provider]  omeprazole (PRILOSEC) 40 MG capsule Take 40 mg by mouth daily as needed (acid reflux).    [provider]  zaleplon (SONATA) 10 MG capsule Take 10 mg by mouth at bedtime as needed for sleep. 11/28/19   [provider]    Family History Family History  Problem Relation Age of Onset  . COPD Mother   . Dementia Mother   . Bipolar disorder Mother   . Lung cancer Father   . Cancer - Lung Father   . Breast cancer Maternal Grandmother        Age 28's  . Colon cancer Neg Hx   . Stomach cancer Neg Hx   . Pancreatic cancer Neg Hx   . Esophageal cancer Neg Hx   . Rectal cancer Neg Hx     Social History Social History   Tobacco Use  . Smoking status: Former Smoker    Types: Cigarettes    Quit date: 05/17/1996    Years since quitting: 24.1  . Smokeless tobacco: Never Used  Vaping Use  . Vaping Use: Never used  Substance Use Topics  . Alcohol use: Yes    Alcohol/week: 0.0 standard drinks    Comment: once a month  . Drug use: No     Allergies   Symmetrel [amantadine hcl] and Tamiflu [oseltamivir phosphate]   Review of Systems Review of Systems  Constitutional: Negative for chills and fever.  HENT: Negative for ear pain and sore throat.   Eyes: Negative  for pain and visual disturbance.  Respiratory: Negative for cough and shortness of breath.   Cardiovascular: Negative for chest pain and palpitations.  Gastrointestinal: Negative for abdominal pain  and vomiting.  Genitourinary: Negative for dysuria and hematuria.  Musculoskeletal: Negative for arthralgias and back pain.  Skin: Positive for wound. Negative for color change.  Neurological: Negative for syncope, weakness and numbness.  All other systems reviewed and are negative.    Physical Exam Triage Vital Signs ED Triage Vitals  Enc Vitals Group     BP 07/11/20 1640 110/72     Pulse --      Resp 07/11/20 1640 18     Temp 07/11/20 1640 98 F (36.7 C)     Temp Source 07/11/20 1640 Oral     SpO2 07/11/20 1640 100 %     Weight --      Height --      Head Circumference --      Peak Flow --      Pain Score 07/11/20 1639 5     Pain Loc --      Pain Edu? --      Excl. in [place]? --    No data found.  Updated Vital Signs BP 110/72 (BP Location: Right Arm)   Temp 98 F (36.7 C) (Oral)   Resp 18   SpO2 100%   Visual Acuity Right Eye Distance:   Left Eye Distance:   Bilateral Distance:    Right Eye Near:   Left Eye Near:    Bilateral Near:     Physical Exam Vitals and nursing note reviewed.  Constitutional:      General: She is not in acute distress.    Appearance: She is well-developed and well-nourished.  HENT:     Head: Normocephalic and atraumatic.     Mouth/Throat:     Mouth: Mucous membranes are moist.  Eyes:     Conjunctiva/sclera: Conjunctivae normal.  Cardiovascular:     Rate and Rhythm: Normal rate and regular rhythm.     Heart sounds: Normal heart sounds.  Pulmonary:     Effort: Pulmonary effort is normal. No respiratory distress.     Breath sounds: Normal breath sounds.  Abdominal:     Palpations: Abdomen is soft.     Tenderness: There is no abdominal tenderness.  Musculoskeletal:        General: No edema. Normal range of motion.     Cervical back:  Neck supple.  Skin:    General: Skin is warm and dry.     Findings: Bruising and lesion present.     Comments: Multiple scratches and puncture wounds on left forearm.  2 larger bite wounds.  Ecchymosis noted around wounds.  Neurological:     General: No focal deficit present.     Mental Status: She is alert and oriented to person, place, and time.     Sensory: No sensory deficit.     Motor: No weakness.     Gait: Gait normal.  Psychiatric:        Mood and Affect: Mood and affect and mood normal.        Behavior: Behavior normal.      UC Treatments / Results  Labs (all labs ordered are listed, but only abnormal results are displayed) Labs Reviewed - No data to display  EKG   Radiology No results found.  Procedures Laceration Repair  Date/Time: 07/11/2020 6:19 PM Performed by: Sharion Balloon, NP Authorized by: Sharion Balloon, NP   Consent:    Consent obtained:  Verbal   Consent given by:  Patient   Risks discussed:  Infection, pain, poor cosmetic result and  poor wound healing Universal protocol:    Procedure explained and questions answered to patient or proxy's satisfaction: yes   Anesthesia:    Anesthesia method:  Local infiltration   Local anesthetic:  Lidocaine 2% w/o epi Laceration details:    Location:  Shoulder/arm   Shoulder/arm location:  L lower arm   Length (cm):  1   Depth (mm):  2 Pre-procedure details:    Preparation:  Patient was prepped and draped in usual sterile fashion Exploration:    Hemostasis achieved with:  Direct pressure   Imaging outcome: foreign body not noted     Wound exploration: wound explored through full range of motion and entire depth of wound visualized   Treatment:    Area cleansed with:  Povidone-iodine   Amount of cleaning:  Standard   Irrigation solution:  Sterile water   Irrigation method:  Syringe   Visualized foreign bodies/material removed: no   Skin repair:    Repair method:  Sutures   Suture size:  3-0   Suture  material:  Nylon   Suture technique:  Simple interrupted   Number of sutures:  2 Repair type:    Repair type:  Simple Post-procedure details:    Dressing:  Antibiotic ointment and sterile dressing   Procedure completion:  Tolerated well, no immediate complications Comments:     One suture placed in each of the two larger bite ones.    (including critical care time)  Medications Ordered in UC Medications - No data to display  Initial Impression / Assessment and Plan / UC Course  I have reviewed the triage vital signs and the nursing notes.  Pertinent labs & imaging results that were available during my care of the patient were reviewed by me and considered in my medical decision making (see chart for details).   Laceration of left forearm due to cat bites.  2 sutures (1 placed in each of the 2 larger bites).  Wounds cleaned and dressed with bacitracin.  Instructed patient to return here for removal of her sutures in 7 to 10 days.  Wound care instructions and signs of infection discussed.  Instructed patient to follow-up with her PCP or return here if she notes signs of infection.  She agrees to plan of care.   Final Clinical Impressions(s) / UC Diagnoses   Final diagnoses:  Cat bite, initial encounter  Laceration of left forearm, initial encounter     Discharge Instructions     Take the antibiotic Augmentin as directed.    Return here in 7 to 10 days for removal of your stitches.    Keep your wounds clean and dry.  Wash them gently twice a day with soap and water.  Apply an antibiotic cream and bandage twice a day.    Follow-up with your primary care provider or return here if you see signs of infection, such as increased pain, redness, pus-like drainage, warmth, fever, chills, or other concerning symptoms.         ED Prescriptions    Medication Sig Dispense Auth. Provider   amoxicillin-clavulanate (AUGMENTIN) 875-125 MG tablet Take 1 tablet by mouth every 12 (twelve)  hours. 14 tablet Sharion Balloon, NP     PDMP not reviewed this encounter.   Sharion Balloon, NP 07/11/20 [phone]

## 2020-07-11 NOTE — Discharge Instructions (Signed)
Take the antibiotic Augmentin as directed.    Return here in 7 to 10 days for removal of your stitches.    Keep your wounds clean and dry.  Wash them gently twice a day with soap and water.  Apply an antibiotic cream and bandage twice a day.    Follow-up with your primary care provider or return here if you see signs of infection, such as increased pain, redness, pus-like drainage, warmth, fever, chills, or other concerning symptoms.

## 2020-07-11 NOTE — ED Triage Notes (Signed)
Pt presents with animal bite from cat on left arm that happened earlier today. States cat is up to date on shots. Denies any numbness or tingling in arm or hand.

## 2020-07-17 ENCOUNTER — Other Ambulatory Visit: Payer: Self-pay

## 2020-07-17 ENCOUNTER — Ambulatory Visit (HOSPITAL_COMMUNITY)
Admission: EM | Admit: 2020-07-17 | Discharge: 2020-07-17 | Disposition: A | Discharge status: Home / Self Care | Payer: Medicare PPO | Attending: Family Medicine | Admitting: Family Medicine

## 2020-07-17 ENCOUNTER — Encounter (HOSPITAL_COMMUNITY): Payer: Self-pay | Admitting: Emergency Medicine

## 2020-07-17 DIAGNOSIS — W5501XD Bitten by cat, subsequent encounter: Secondary | ICD-10-CM

## 2020-07-17 DIAGNOSIS — T07XXXA Unspecified multiple injuries, initial encounter: Secondary | ICD-10-CM

## 2020-07-17 MED ORDER — AMOXICILLIN-POT CLAVULANATE 875-125 MG PO TABS: 1.0000 | ORAL_TABLET | Freq: Two times a day (BID) | ORAL | 0 refills | Status: DC

## 2020-07-17 NOTE — ED Triage Notes (Signed)
Pt presents for follow-up on animal bite on left arm from 07/11/20. States is much more painful now and had hard feeling under skin in multiple areas.   States has one more day of antibiotic prescribed at last visit.

## 2020-07-17 NOTE — ED Provider Notes (Signed)
[facility]   [id number] 07/17/20 Arrival Time: 8937  ASSESSMENT & PLAN:  1. Multiple puncture wounds   2. Cat bite, subsequent encounter    She prefers to complete 10 day course of Augmentin.  Meds ordered this encounter  Medications  . amoxicillin-clavulanate (AUGMENTIN) 875-125 MG tablet    Sig: Take 1 tablet by mouth every 12 (twelve) hours.    Dispense:  6 tablet    Refill:  0   No signs of active infection or abscess formation. Two Ethilon sutures removed without complication.  May f/u here as needed. Reviewed expectations re: course of current medical issues. Questions answered. Outlined signs and symptoms indicating need for more acute intervention. Patient verbalized understanding. After Visit Summary given.   SUBJECTIVE:  Jaclyn Lopez is a 70 y.o. female who presents s/p cat bite to L forearm; seen here; wounds cleaned and sutured. Still with mild surrounding erythema. Afebrile. Wounds are very tender. Tolerating Augmentin BID.   OBJECTIVE:  Vitals:   07/17/20 1250  BP: (!) 122/59  Pulse: 78  Resp: 16  Temp: 97.6 F (36.4 C)  TempSrc: Oral  SpO2: 100%     General appearance: alert; no distress LUE: four healing puncture wounds over inner LEFT forearm; sutures in two of these; appear to be healing well; no signs of infection; no bleeding; normal distal sensation and 2+ radial pulse Psychological: alert and cooperative; normal mood and affect   No results found.  Allergies  Allergen Reactions  . Symmetrel [Amantadine Hcl] Hives  . Tamiflu [Oseltamivir Phosphate] Hives    Past Medical History:  Diagnosis Date  . Atypical glandular cells on Pap smear 2003  . 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 CKD (chronic kidney disease)   . CVA (cerebral vascular accident) ([place])   . Depression   . GERD (gastroesophageal reflux disease)   . Herpes progenitalis   . HPV in female  2014/2015/2016   Normal cytology but positive HPV x3 normal colposcopy/negative the ECC  . Hyperlipidemia   . Hyperthyroidism   . Hypothyroidism   . IBS (irritable bowel syndrome)   . IC (interstitial cystitis)   . Insomnia   . 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 Subsequent LEEP showed CIN-2 with clear margins and negative ECC  . Osteopenia 11/2018   T score -1.5 FRAX 9% / 1% improved at both hips stable at spine  . Stroke ([place]) 11/2019  . Thyroid disease    Hyperthyroid   Social History   Socioeconomic History  . Marital status: Divorced    Spouse name: Not on file  . Number of children: 2  . Years of education: Not on file  . Highest education level: Not on file  Occupational History  . Occupation: Pharmacist, hospital    Comment: [employer]  Tobacco Use  . Smoking status: Former Smoker    Types: Cigarettes    Quit date: 05/17/1996    Years since quitting: 24.1  . Smokeless tobacco: Never Used  Vaping Use  . Vaping Use: Never used  Substance and Sexual Activity  . Alcohol use: Yes    Alcohol/week: 0.0 standard drinks    Comment: once a month  . Drug use: No  . Sexual activity: Never    Birth control/protection: Surgical    Comment: 1st intercourse 70 yo-More than 5 partners  Other Topics Concern  . 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

## 2020-08-19 ENCOUNTER — Other Ambulatory Visit (INDEPENDENT_AMBULATORY_CARE_PROVIDER_SITE_OTHER): Payer: Medicare PPO

## 2020-08-19 DIAGNOSIS — R748 Abnormal levels of other serum enzymes: Secondary | ICD-10-CM | POA: Diagnosis not present

## 2020-08-19 LAB — HEPATIC FUNCTION PANEL
ALT: 20 U/L (ref 0–35)
AST: 19 U/L (ref 0–37)
Albumin: 4.1 g/dL (ref 3.5–5.2)
Alkaline Phosphatase: 103 U/L (ref 39–117)
Bilirubin, Direct: 0.1 mg/dL (ref 0.0–0.3)
Total Bilirubin: 0.6 mg/dL (ref 0.2–1.2)
Total Protein: 6.6 g/dL (ref 6.0–8.3)

## 2020-08-22 ENCOUNTER — Other Ambulatory Visit: Payer: Self-pay | Admitting: Internal Medicine

## 2020-08-22 DIAGNOSIS — R748 Abnormal levels of other serum enzymes: Secondary | ICD-10-CM

## 2020-10-03 DIAGNOSIS — H16223 Keratoconjunctivitis sicca, not specified as Sjogren's, bilateral: Secondary | ICD-10-CM | POA: Diagnosis not present

## 2020-10-03 DIAGNOSIS — H0288B Meibomian gland dysfunction left eye, upper and lower eyelids: Secondary | ICD-10-CM | POA: Diagnosis not present

## 2020-10-03 DIAGNOSIS — H0288A Meibomian gland dysfunction right eye, upper and lower eyelids: Secondary | ICD-10-CM | POA: Diagnosis not present

## 2020-10-15 ENCOUNTER — Encounter: Payer: Self-pay | Admitting: Adult Health

## 2020-10-15 ENCOUNTER — Ambulatory Visit: Payer: Medicare PPO | Admitting: Adult Health

## 2020-10-15 VITALS — BP 105/72 | HR 67 | Ht 66.0 in | Wt 149.0 lb

## 2020-10-15 DIAGNOSIS — I639 Cerebral infarction, unspecified: Secondary | ICD-10-CM | POA: Diagnosis not present

## 2020-10-15 DIAGNOSIS — Q2112 Patent foramen ovale: Secondary | ICD-10-CM

## 2020-10-15 DIAGNOSIS — Q211 Atrial septal defect: Secondary | ICD-10-CM

## 2020-10-15 DIAGNOSIS — E785 Hyperlipidemia, unspecified: Secondary | ICD-10-CM

## 2020-10-15 DIAGNOSIS — Z8616 Personal history of COVID-19: Secondary | ICD-10-CM

## 2020-10-15 HISTORY — DX: Personal history of COVID-19: Z86.16

## 2020-10-15 NOTE — Progress Notes (Signed)
[facility] [address] [place]. [place] [address] [phone]       STROKE FOLLOW UP NOTE  [name] Date of Birth:  1950-12-11 Medical Record Number:  [id number]   Reason for Referral: stroke follow up    SUBJECTIVE:   CHIEF COMPLAINT:  Chief Complaint  Patient presents with  . Follow-up    Rm 14 alone Pt is doing well, occasional tingling/twitch in lips but no other symptoms or complications.     HPI:   [name] is a 70 y.o. female w/pmh of CKD, breast cancer, hypothyroidism, depression, who presented on 11/29/2019 with slurred speech and left facial droop.  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  MRA head/neck unremarkable.  2D echo EF of 55 to 60%.  Recommended DAPT for 3 weeks and aspirin alone.  LDL 155 and initiate atorvastatin 80 mg daily.  No history of HTN or DM with A1c 5.3.  Other stroke risk factors include prior stroke on imaging (L frontal cortical infarct).  Evaluated by therapy without therapy needs and discharged home in stable condition  Today, 10/15/2020, Jaclyn Lopez returns for 59-month stroke follow-up unaccompanied. She has been doing well since prior visit with occasional perioral numbness/twitching which has been present since her stroke and occasional speech difficulty but overall greatly improved.  Denies new stroke/TIA symptoms.  Compliant on aspirin and atorvastatin without associated side effects.  Blood pressure today 105/72.  No new concerns at this time.    ROS:   14 system review of systems performed and negative with exception of those listed in HPI  PMH:  Past Medical History:  Diagnosis Date  . Atypical glandular cells on Pap smear 2003  . 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 CKD (chronic kidney disease)   . CVA (cerebral vascular accident) ([place])   . Depression    . GERD (gastroesophageal reflux disease)   . Herpes progenitalis   . 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 Hyperlipidemia   . Hyperthyroidism   . Hypothyroidism   . IBS (irritable bowel syndrome)   . IC (interstitial cystitis)   . Insomnia   . 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 Subsequent LEEP showed CIN-2 with clear margins and negative ECC  . Osteopenia 11/2018   T score -1.5 FRAX 9% / 1% improved at both hips stable at spine  . Stroke ([place]) 11/2019  . Thyroid disease    Hyperthyroid    PSH:  Past Surgical History:  Procedure Laterality Date  . Bladder stretch and Bx  1990  . BREAST LUMPECTOMY  2005   right; cancer  . BREAST LUMPECTOMY  2006   left; pre cancer  . BREAST SURGERY     Reduction  . BUNIONECTOMY  2007, 2011  . CERVICAL CONE BIOPSY  2003  . CESAREAN SECTION  1983  . DILATION AND CURETTAGE OF UTERUS  2009  . ENDOMETRIAL ABLATION     Novasure  . GANGLION CYST EXCISION Left 1985  . HYSTEROSCOPY  2009  . LEEP  05/2015   CIN-2 with clear margins  . MASTECTOMY, PARTIAL    . MOUTH SURGERY  2012   implants  . TUBAL LIGATION  1984    Social History:  Social History   Socioeconomic History  .  Marital status: Divorced    Spouse name: Not on file  . Number of children: 2  . Years of education: Not on file  . Highest education level: Not on file  Occupational History  . Occupation: Pharmacist, hospital    Comment: [employer]  Tobacco Use  . Smoking status: Former Smoker    Types: Cigarettes    Quit date: 05/17/1996    Years since quitting: 24.4  . Smokeless tobacco: Never Used  Vaping Use  . Vaping Use: Never used  Substance and Sexual Activity  . Alcohol use: Yes    Alcohol/week: 0.0 standard drinks    Comment: once a month  . Drug use: No  . Sexual activity: Never    Birth control/protection: Surgical    Comment: 1st intercourse 70 yo-More than  5 partners  Other Topics Concern  . 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 COPD Mother   . Dementia Mother   . Bipolar disorder Mother   . Lung cancer Father   . Cancer - Lung Father   . Breast cancer Maternal Grandmother        Age 43's  . Colon cancer Neg Hx   . Stomach cancer Neg Hx   . Pancreatic cancer Neg Hx   . Esophageal cancer Neg Hx   . Rectal cancer Neg Hx     Medications:   Current Outpatient Medications on File Prior to Visit  Medication Sig Dispense Refill  . ARTIFICIAL TEAR SOLUTION OP Place 1 drop into both eyes daily as needed (dry eyes).    Marland Kitchen aspirin EC 81 MG EC tablet Take 1 tablet (81 mg total) by mouth daily. Swallow whole. 30 tablet 3  . atorvastatin (LIPITOR) 80 MG tablet TAKE 1 TABLET BY MOUTH EVERY DAY 90 tablet 0  . Biotin 5 MG CAPS Take 5 mg by mouth daily.    Marland Kitchen buPROPion (WELLBUTRIN XL) 150 MG 24 hr tablet Take 150 mg by mouth every morning.    . calcium carbonate (TUMS - DOSED IN MG ELEMENTAL CALCIUM) 500 MG chewable tablet Chew 1 tablet by mouth daily as needed for indigestion or heartburn.    . cholecalciferol (VITAMIN D3) 25 MCG (1000 UNIT) tablet Take 1,000 Units by mouth daily.    Marland Kitchen FLUoxetine (PROZAC) 20 MG capsule Take 20 mg by mouth daily.    Marland Kitchen ibuprofen (ADVIL) 200 MG tablet Take 200 mg by mouth every 6 (six) hours as needed for headache or moderate pain.    Marland Kitchen levothyroxine (SYNTHROID, LEVOTHROID) 50 MCG tablet Take 50 mcg by mouth daily.    . Methen-Hyosc-Meth Blue-Na Phos (ME/NAPHOS/MB/HYO1) 81.6 MG TABS Take 1 tablet by  mouth daily as needed (urinary pain).    . mirtazapine (REMERON) 15 MG tablet Take 3.75 mg by mouth at bedtime.     . Omega-3 Fatty Acids (FISH OIL) 1000 MG CAPS Take by mouth.    Marland Kitchen omeprazole (PRILOSEC) 40 MG capsule Take 40 mg by mouth daily as needed (acid reflux).    Marland Kitchen  zaleplon (SONATA) 10 MG capsule Take 10 mg by mouth at bedtime as needed for sleep.     No current facility-administered medications on file prior to visit.    Allergies:   Allergies  Allergen Reactions  . Symmetrel [Amantadine Hcl] Hives  . Tamiflu [Oseltamivir Phosphate] Hives      OBJECTIVE:  Physical Exam  Vitals:   10/15/20 1030  Weight: 149 lb (67.6 kg)  Height: 5\' 6"  (1.676 m)   Body mass index is 24.05 kg/m. No exam data present  General: well developed, well nourished, pleasant middle-age Caucasian female, seated, in no evident distress Head: head normocephalic and atraumatic.   Neck: supple with no carotid or supraclavicular bruits Cardiovascular: regular rate and rhythm, no murmurs Musculoskeletal: no deformity Skin:  no rash/petichiae Vascular:  Normal pulses all extremities   Neurologic Exam Mental Status: Awake and fully alert.   Fluent speech and language. Oriented to place and time. Recent and remote memory intact. Attention span, concentration and fund of knowledge appropriate. Mood and affect appropriate.  Cranial Nerves: Pupils equal, briskly reactive to light. Extraocular movements full without nystagmus. Visual fields full to confrontation. Hearing intact. Facial sensation intact.  Mild Left lower facial weakness when smiling.  Tongue, and palate moves normally and symmetrically.  Motor: Normal bulk and tone. Normal strength in all tested extremity muscles except mild left hip flexor weakness. Sensory.: intact to touch , pinprick , position and vibratory sensation.  Coordination: Rapid alternating movements normal in all extremities. Finger-to-nose and heel-to-shin performed  accurately bilaterally.  Gait and Station: Arises from chair without difficulty. Stance is normal. Gait demonstrates normal stride length and balance without use of assistive device Reflexes: 1+ and symmetric. Toes downgoing.        ASSESSMENT: Jaclyn Lopez is a 70 y.o. year old female presented with left facial droop and dysarthria on 11/29/2019 with stroke work-up revealing right frontal lobe acute infarct secondary to small vessel disease. Vascular risk factors include prior stroke on imaging, HLD and PFO.     PLAN:  1. R frontal stroke :  a. Minimal residual deficit of occasional dysarthria and left lower lip numbness/twitching but overall greatly improved. b. Continue aspirin 81 mg daily  and atorvastatin 40 mg daily for secondary stroke prevention.  c. Discussed secondary stroke prevention measures and importance of close PCP follow up for aggressive stroke risk factor management  2. HLD: LDL goal <70.  On atorvastatin 40 mg daily per PCP with routine monitoring of lipid panel which has been satisfactory 3. PFO: Incidental finding not correlating to current stroke.  No indication for intervention at this time    Overall stable from stroke standpoint and recommend follow-up on an as-needed basis  CC:  [facility] provider: Dr. Neil Crouch, Thayer Jew, MD    I spent 26 minutes of face-to-face and non-face-to-face time with patient.  This included previsit chart review and electronic health record documentation, as well as patient education and discussion regarding prior stroke, minimal occasional residual deficits, secondary stroke prevention management and importance of managing stroke risk factors, stroke recurrence and stroke warning signs and answered all other questions to patient satisfaction  Frann Rider, AGNP-BC  [facility] [address] [place], [place] [address]  Phone [phone] Fax [phone] Note: This document was prepared with  digital dictation and possible smart phrase technology. Any transcriptional errors that result from this process are unintentional.

## 2020-10-15 NOTE — Patient Instructions (Addendum)
Continue aspirin 81 mg daily  and atorvastatin for secondary stroke prevention  Continue to follow up with PCP regarding cholesterol management  Maintain strict control of cholesterol with LDL cholesterol (bad cholesterol) goal below 70 mg/dL.        Thank you for coming to see Jaclyn Lopez at [facility]. I hope we have been able to provide you high quality care today.  You may receive a patient satisfaction survey over the next few weeks. We would appreciate your feedback and comments so that we may continue to improve ourselves and the health of our patients.    Warning Signs of a Stroke A stroke is a medical emergency and should be treated right away--every second counts. A stroke is caused by a decrease or block in blood flow to the brain. When certain areas of the brain do not get enough oxygen, brain cells begin to die. A stroke can lead to brain damage and can sometimes be life-threatening. However, if a person gets medical treatment right away, he or she has a better chance of surviving and recovering from a stroke. It is very important to be able to recognize the symptoms of a stroke. Types of strokes There are two main types of strokes:  Ischemic stroke. This is the most common type. This stroke happens when a blood vessel that supplies blood to the brain is blocked.  Hemorrhagic stroke. This results from bleeding in the brain when a blood vessel leaks or bursts (ruptures). A transient ischemic attack (TIA) causes stroke-like symptoms that go away quickly. Unlike a stroke, a TIA does not cause permanent damage to the brain. However, the symptoms of a TIA are the same as a stroke. TIAs also require medical treatment right away. Having a TIA is a sign that you are at higher risk for a stroke. Warning signs of a stroke The symptoms of stroke may vary and will reflect the part of the brain that is involved. Symptoms usually happen suddenly. "BE FAST" is an easy way to  remember the main warning signs of a stroke:  B - Balance. Signs are dizziness, sudden trouble walking, or loss of balance.  E - Eyes. Signs are trouble seeing or a sudden change in vision.  F - Face. Signs are sudden weakness or numbness of the face, or the face or eyelid drooping on one side.  A - Arms. Signs are weakness or numbness in an arm. This happens suddenly and usually on one side of the body.  S - Speech. Signs are sudden trouble speaking, slurred speech, or trouble understanding what people say.  T - Time. Time to call emergency services. Write down what time symptoms started. Other signs of a stroke Some less common signs of a stroke include:  A sudden, severe headache with no known cause.  Nausea or vomiting.  Seizure. A stroke may be happening even if only one "BE FAST" symptom is present. These symptoms may represent a serious problem that is an emergency. Do not wait to see if the symptoms will go away. Get medical help right away. Call your local emergency services (911 in the [place] Do not drive yourself to the hospital.   Summary  A stroke is a medical emergency and should be treated right away--every second counts.  "BE FAST" is an easy way to remember the main warning signs of a stroke.  Call your local emergency services right away if you or someone else has any stroke symptoms, even if  the symptoms go away.  Make note of what time the first symptoms appeared. Emergency responders or emergency room staff will need to know this information.  Do not wait to see if symptoms will go away. Call 911 even if only one of the "BE FAST" symptoms appears. This information is not intended to replace advice given to you by your health care provider. Make sure you discuss any questions you have with your health care provider. Document Revised: 12/03/2019 Document Reviewed: 12/03/2019 Elsevier Patient Education  [place].

## 2020-10-17 DIAGNOSIS — Z1159 Encounter for screening for other viral diseases: Secondary | ICD-10-CM | POA: Diagnosis not present

## 2020-10-18 IMAGING — US US THYROID
1 series · 12 of 25 positions shown · non-contrast
Comparison: Neck MRA-11/29/2019;

CLINICAL DATA: Incidental on MRI. Thyroid nodule incidentally noted
on neck MRA performed 11/29/2019. History of previous
ultrasound-guided fine-needle aspiration of isthmic nodule on
10/12/2007 and previous radioactive iodine ablation

EXAM:
THYROID ULTRASOUND
TECHNIQUE: Ultrasound examination of the thyroid gland and adjacent soft
tissues was performed.

[Series 1: us thyroid · 0.04mm/px · 12 of 35 slices shown]
[im 2/35]
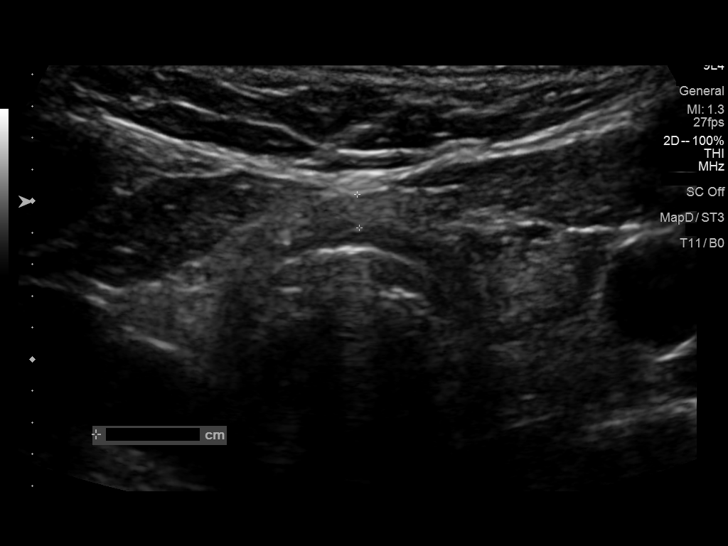
[im 5/35]
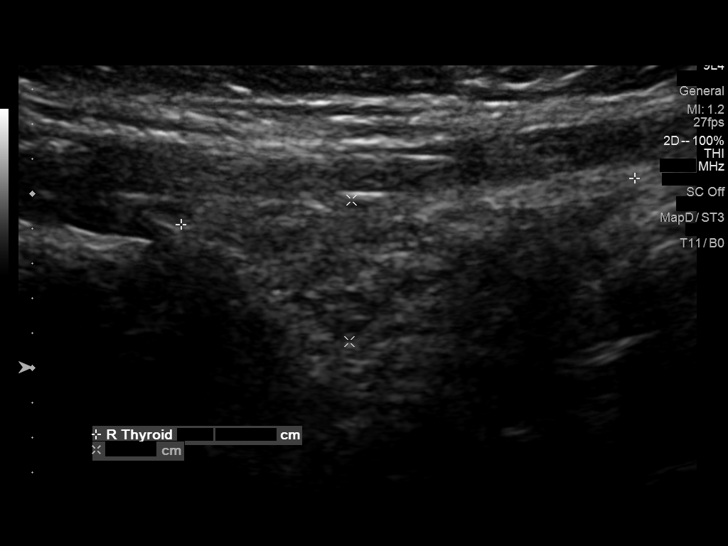
[im 8/35]
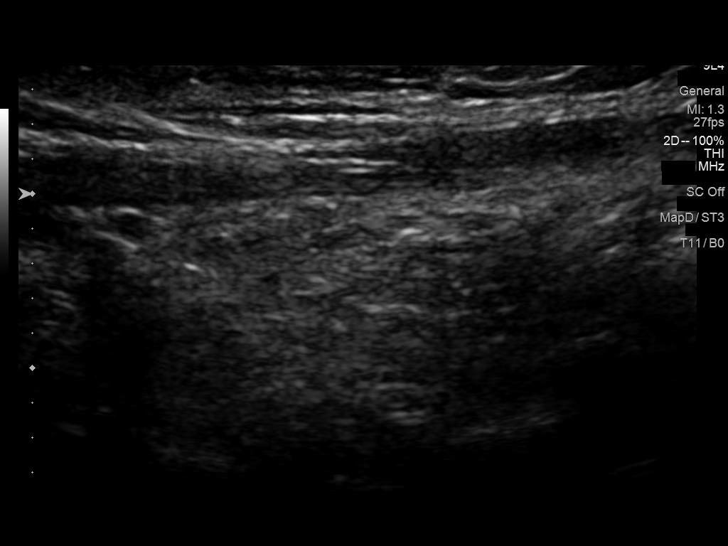
[im 10/35]
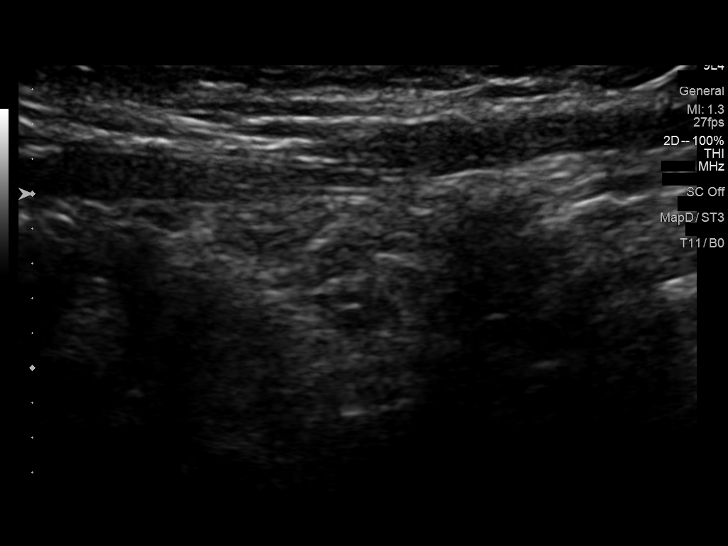
[im 13/35]
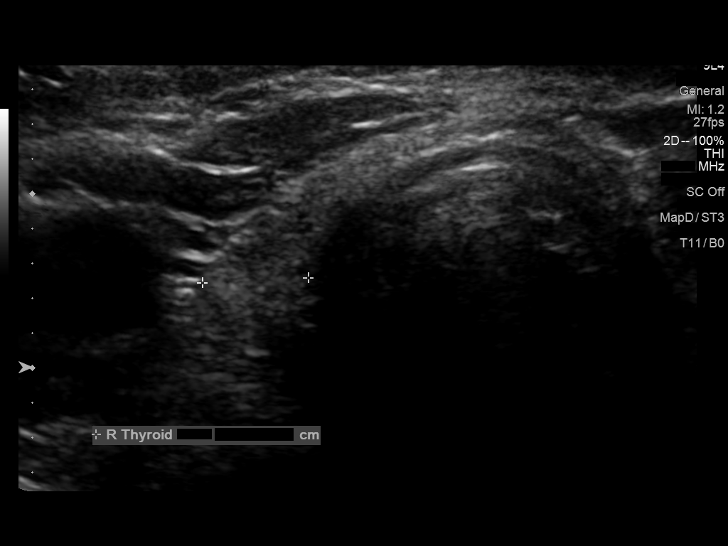
[im 16/35]
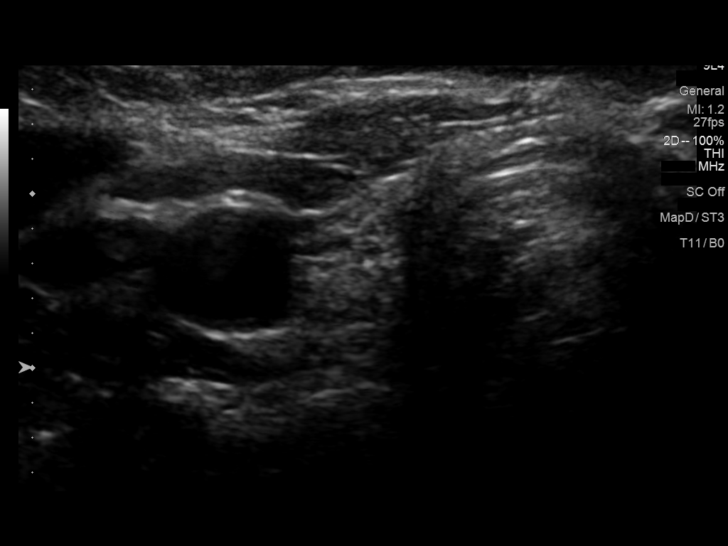
[im 19/35]
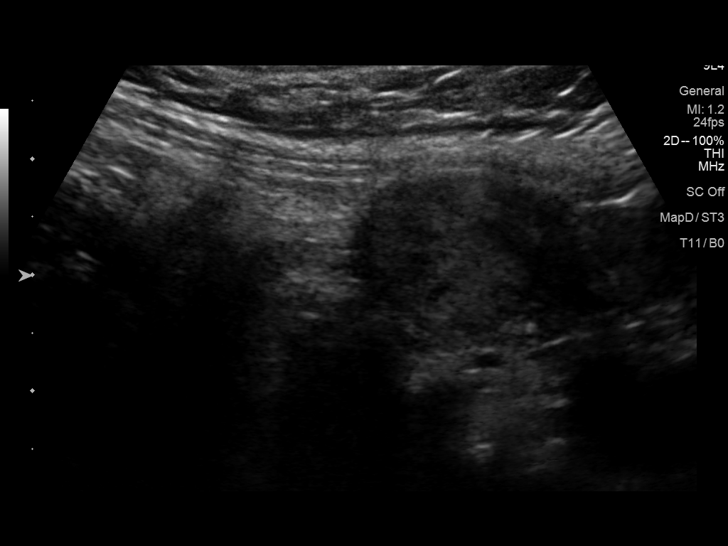
[im 22/35]
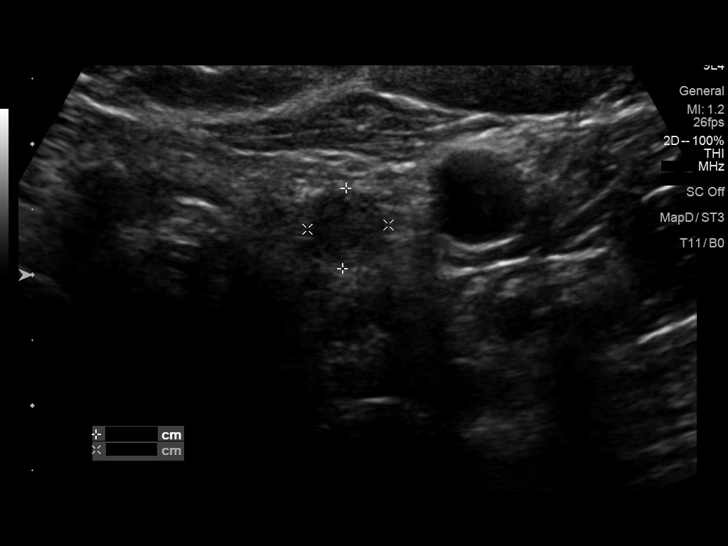
[im 25/35]
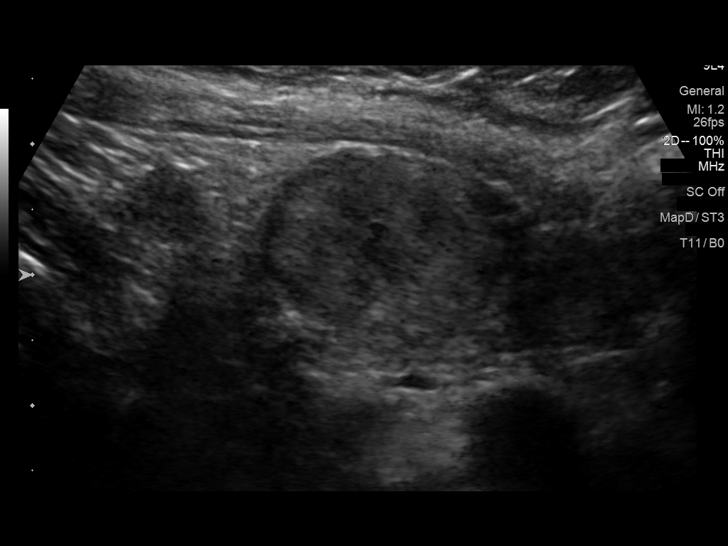
[im 27/35]
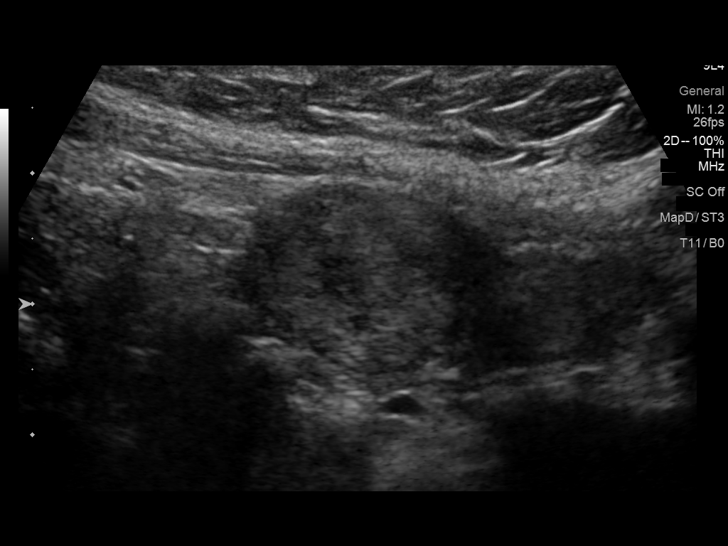
[im 30/35]
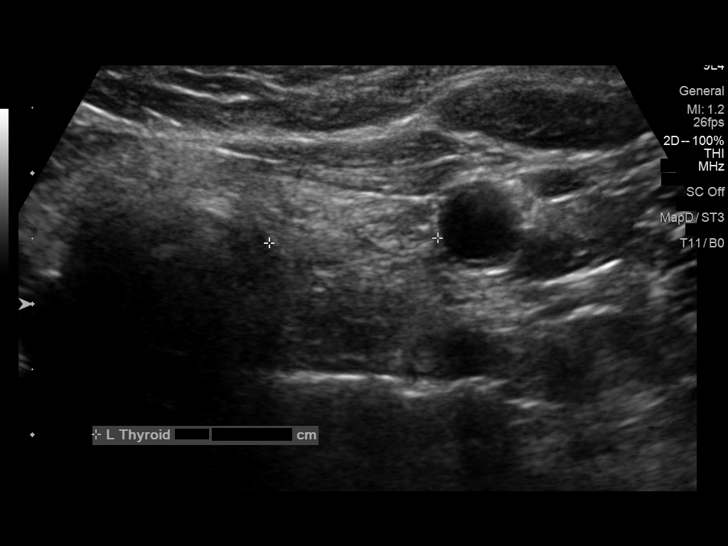
[im 33/35]
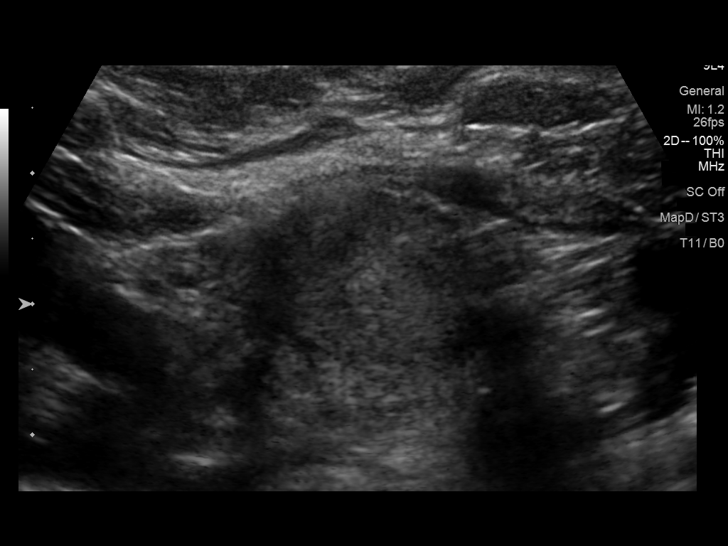

[12 of 25 positions shown; findings below may reference images not displayed]

thyroid ultrasound - 05/17/2011;
08/23/2007; ultrasound-guided isthmic nodule fine-needle
aspiration-10/12/2007
FINDINGS: Parenchymal Echotexture: Markedly heterogenous

Isthmus: Normal in size measures 0.2 cm in diameter, unchanged

Right lobe: Atrophic measuring 2.6 x 0.8 x 0.6 cm, previously, 2.7 x
0.6 x 0.5 cm

Left lobe: Normal in size measuring 4.5 x 2.0 x 1 3 cm, previously,
8.0 x 2.9 x 2.6 cm with interval reduction in size attributable
provided history of radioactive iodine ablation.

_________________________________________________________

Estimated total number of nodules >/= 1 cm: 1

Number of spongiform nodules >/=  2 cm not described below (TR1): 0

Number of mixed cystic and solid nodules >/= 1.5 cm not described
below (TR2): 0

_________________________________________________________

The approximately 0.7 x 0.6 x 0.6 cm hypoechoic nodule within the
superior pole the left lobe of the thyroid (labeled 1), is unchanged
compared to the 4924 examination, previously, 0.7 cm. Stability for
greater than 5 years is indicative of benign etiology.

The previously biopsied approximately 1.9 x 1.8 x 1.6 cm hypoechoic
nodule within the isthmus/inferior medial aspect the left lobe of
the thyroid (labeled 2) is unchanged compared to the 4924
examination, previously, 1.8 cm, reduced in size compared to the
2888 examination, previously, 4.3 cm. Correlation with previous
biopsy results is advised. Additionally, imaging stability for
greater than 5 years is indicative of benign etiology.
IMPRESSION: 1. Interval reduction in size of markedly heterogeneous appearing
thyroid, likely the sequela of previous radioactive iodine ablation.
2. No worrisome new or enlarging thyroid nodules.
3. Previously biopsied dominant nodule within the isthmus/inferior
medial aspect of the left lobe of the thyroid (correlating with the
nodule seen on preceding neck MRA) is unchanged in size compared to
the 4924 examination and reduced in size compared to the 2888 exam.
Correlation with previous biopsy results is advised. Additionally,
imaging stability for greater than 5 years is indicative benign
etiology. Assuming a benign pathologic diagnosis, repeat sampling
and/or continued dedicated follow-up is not recommended.
4. Additional punctate (approximately 0.7 cm) left-sided thyroid
nodule is unchanged since the 4924 examination. Stability for
greater than 5 years is indicative of a benign etiology.

The above is in keeping with the ACR TI-RADS recommendations - [HOSPITAL] 4343;[DATE].

## 2020-10-20 NOTE — Progress Notes (Signed)
I agree with the above plan 

## 2020-11-19 DIAGNOSIS — H02884 Meibomian gland dysfunction left upper eyelid: Secondary | ICD-10-CM | POA: Diagnosis not present

## 2020-11-19 DIAGNOSIS — H02881 Meibomian gland dysfunction right upper eyelid: Secondary | ICD-10-CM | POA: Diagnosis not present

## 2020-11-19 DIAGNOSIS — H04123 Dry eye syndrome of bilateral lacrimal glands: Secondary | ICD-10-CM | POA: Diagnosis not present

## 2020-11-19 DIAGNOSIS — Z961 Presence of intraocular lens: Secondary | ICD-10-CM | POA: Diagnosis not present

## 2020-11-25 ENCOUNTER — Ambulatory Visit: Payer: Medicare PPO | Admitting: Nurse Practitioner

## 2020-11-27 ENCOUNTER — Encounter: Payer: Self-pay | Admitting: Nurse Practitioner

## 2020-11-27 ENCOUNTER — Other Ambulatory Visit: Payer: Self-pay

## 2020-11-27 ENCOUNTER — Ambulatory Visit (INDEPENDENT_AMBULATORY_CARE_PROVIDER_SITE_OTHER): Payer: Medicare PPO | Admitting: Nurse Practitioner

## 2020-11-27 VITALS — BP 110/70 | HR 84 | Ht 65.5 in | Wt 148.0 lb

## 2020-11-27 DIAGNOSIS — B009 Herpesviral infection, unspecified: Secondary | ICD-10-CM | POA: Diagnosis not present

## 2020-11-27 DIAGNOSIS — M8589 Other specified disorders of bone density and structure, multiple sites: Secondary | ICD-10-CM | POA: Diagnosis not present

## 2020-11-27 DIAGNOSIS — Z780 Asymptomatic menopausal state: Secondary | ICD-10-CM

## 2020-11-27 DIAGNOSIS — Z9289 Personal history of other medical treatment: Secondary | ICD-10-CM

## 2020-11-27 DIAGNOSIS — Z01419 Encounter for gynecological examination (general) (routine) without abnormal findings: Secondary | ICD-10-CM

## 2020-11-27 DIAGNOSIS — Z853 Personal history of malignant neoplasm of breast: Secondary | ICD-10-CM | POA: Diagnosis not present

## 2020-11-27 MED ORDER — ACYCLOVIR 400 MG PO TABS: 400.0000 mg | ORAL_TABLET | ORAL | 4 refills | Status: DC

## 2020-11-27 NOTE — Progress Notes (Signed)
Jaclyn Lopez Feb 21, 1951 [id number]   History:  70 y.o. G2 P2 presents for breast and pelvic exam without GYN complaints. Postmenopausal - no HRT, no bleeding. 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 Abnormal pap history - 2014 negative cytology positive HPV, 2015 negative cytology positive HPV, colposcopy normal. 2016 normal cytology positive HPV negative 16/18/45, colposcopy showed LGSIL. LEEP 2017 HGSIL, gladular involvement, clear margins. Subsequent paps normal. Takes Acyclovir 2x/weekly for HSV, rare outbreaks. Osteopenia. Followed by urology for interstitial cystitis.   Gynecologic History No LMP recorded. Patient is postmenopausal.   Contraception: post menopausal status Last Pap: 11/21/2019. Results were: normal Last mammogram: 01/07/2020. Results were: normal Last colonoscopy: 05/2019. Results were: benign polyps, 5-year recall Last Dexa: 12/05/2018. Results were: T-score -1.5 FRAX 9.0% / 1.0%  Past medical history, past surgical history, family history and social history were all reviewed and documented in the EPIC chart. Divorced. Retired. 2 children, oldest lives here with 2 grandchildren.   ROS:  A ROS was performed and pertinent positives and negatives are included.  Exam:  Vitals:   11/27/20 1051  BP: 110/70  Pulse: 84  SpO2: 99%  Weight: 148 lb (67.1 kg)  Height: 5' 5.5" (1.664 m)    Body mass index is 24.25 kg/m.  General appearance:  Normal Thyroid:  Symmetrical, normal in size, without palpable masses or nodularity. Respiratory  Auscultation:  Clear without wheezing or rhonchi Cardiovascular  Auscultation:  Regular rate, without rubs, murmurs or gallops  Edema/varicosities:  Not grossly evident Abdominal  Soft,nontender, without masses, guarding or rebound.  Liver/spleen:  No organomegaly noted  Hernia:  None appreciated  Skin  Inspection:  Grossly normal   Breasts: Examined lying and  sitting.   Right: Without masses, retractions, discharge or axillary adenopathy.   Left: Without masses, retractions, discharge or axillary adenopathy. Gentitourinary   Inguinal/mons:  Normal without inguinal adenopathy  External genitalia:  Normal  BUS/Urethra/Skene's glands:  Normal  Vagina:  Normal. Atrophic changes  Cervix:  Normal  Uterus:  Normal in size, shape and contour.  Midline and mobile  Adnexa/parametria:     Rt: Without masses or tenderness.   Lt: Without masses or tenderness.  Anus and perineum: Normal  Digital rectal exam: Normal sphincter tone without palpated masses or tenderness  Assessment/Plan:  69 y.o. G2 P2 for annual exam.   Well female exam with routine gynecological exam - Education provided on SBEs, importance of preventative screenings, current guidelines, high calcium diet, regular exercise, and multivitamin daily. Labs with PCP.   Osteopenia of multiple sites - Plan: DG Bone Density. T- score -1.5 11/2019. Recommend repeating DXA next month. Continue calcium and vitamin D supplement and regular exercise.  History of right breast cancer - 2005 right DCIS managed with lumpectomy and radiation.  HSV-2 (herpes simplex virus 2) infection - Plan: acyclovir (ZOVIRAX) 400 MG tablet twice weekly. She has been on this regimen for years and would like to continue. Rare outbreaks.   Postmenopausal - Plan: DG Bone Density. No HRT, no bleeding.   Screening for cervical cancer - 2014 negative cytology positive HPV, 2015 negative cytology positive HPV, colposcopy normal. 2016 normal cytology positive HPV negative 16/18/45, colposcopy showed LGSIL. LEEP 2017 HGSIL, gladular involvement, clear margins. Subsequent paps normal in 2018/2019/2020. Guidelines recommend returning to 3-year interval.   Screening for breast cancer - Continue annual screenings.  Normal breast exam today. 2005 right DCIS, 2006 left lumpectomy for pre-cancer.   Screening for colon  cancer - 2021  colonoscopy. Will repeat at GI's recommended interval.   Follow up in 1 year for annual      [place], 11:20 AM 11/27/2020

## 2020-12-03 DIAGNOSIS — H04121 Dry eye syndrome of right lacrimal gland: Secondary | ICD-10-CM | POA: Diagnosis not present

## 2020-12-03 DIAGNOSIS — H04122 Dry eye syndrome of left lacrimal gland: Secondary | ICD-10-CM | POA: Diagnosis not present

## 2020-12-15 ENCOUNTER — Ambulatory Visit: Payer: Medicare PPO | Admitting: Nurse Practitioner

## 2020-12-16 ENCOUNTER — Other Ambulatory Visit: Payer: Self-pay

## 2020-12-16 ENCOUNTER — Ambulatory Visit (INDEPENDENT_AMBULATORY_CARE_PROVIDER_SITE_OTHER): Payer: Medicare PPO

## 2020-12-16 ENCOUNTER — Other Ambulatory Visit: Payer: Self-pay | Admitting: Nurse Practitioner

## 2020-12-16 DIAGNOSIS — Z780 Asymptomatic menopausal state: Secondary | ICD-10-CM

## 2020-12-16 DIAGNOSIS — M8589 Other specified disorders of bone density and structure, multiple sites: Secondary | ICD-10-CM | POA: Diagnosis not present

## 2021-01-07 DIAGNOSIS — Z1231 Encounter for screening mammogram for malignant neoplasm of breast: Secondary | ICD-10-CM | POA: Diagnosis not present

## 2021-01-12 ENCOUNTER — Encounter: Payer: Self-pay | Admitting: Gynecology

## 2021-01-22 DIAGNOSIS — R921 Mammographic calcification found on diagnostic imaging of breast: Secondary | ICD-10-CM | POA: Diagnosis not present

## 2021-01-22 DIAGNOSIS — R928 Other abnormal and inconclusive findings on diagnostic imaging of breast: Secondary | ICD-10-CM | POA: Diagnosis not present

## 2021-01-23 ENCOUNTER — Encounter: Payer: Self-pay | Admitting: Nurse Practitioner

## 2021-01-24 ENCOUNTER — Other Ambulatory Visit: Payer: Self-pay | Admitting: Nurse Practitioner

## 2021-01-24 DIAGNOSIS — B009 Herpesviral infection, unspecified: Secondary | ICD-10-CM

## 2021-01-29 ENCOUNTER — Other Ambulatory Visit: Payer: Self-pay

## 2021-01-29 DIAGNOSIS — R748 Abnormal levels of other serum enzymes: Secondary | ICD-10-CM | POA: Diagnosis not present

## 2021-01-29 DIAGNOSIS — C50111 Malignant neoplasm of central portion of right female breast: Secondary | ICD-10-CM | POA: Diagnosis not present

## 2021-01-29 DIAGNOSIS — E039 Hypothyroidism, unspecified: Secondary | ICD-10-CM | POA: Diagnosis not present

## 2021-01-29 DIAGNOSIS — C50411 Malignant neoplasm of upper-outer quadrant of right female breast: Secondary | ICD-10-CM | POA: Diagnosis not present

## 2021-02-02 ENCOUNTER — Telehealth: Payer: Self-pay | Admitting: Hematology and Oncology

## 2021-02-02 ENCOUNTER — Encounter: Payer: Self-pay | Admitting: Internal Medicine

## 2021-02-02 NOTE — Telephone Encounter (Signed)
Spoke to patient to confirm afternoon clinic appointment on 9/28, [facility] will send packet

## 2021-02-04 ENCOUNTER — Encounter: Payer: Self-pay | Admitting: *Deleted

## 2021-02-04 DIAGNOSIS — Z170 Estrogen receptor positive status [ER+]: Secondary | ICD-10-CM

## 2021-02-04 DIAGNOSIS — C50411 Malignant neoplasm of upper-outer quadrant of right female breast: Secondary | ICD-10-CM

## 2021-02-05 ENCOUNTER — Encounter: Payer: Self-pay | Admitting: Nurse Practitioner

## 2021-02-09 DIAGNOSIS — F439 Reaction to severe stress, unspecified: Secondary | ICD-10-CM | POA: Diagnosis not present

## 2021-02-09 DIAGNOSIS — M899 Disorder of bone, unspecified: Secondary | ICD-10-CM | POA: Diagnosis not present

## 2021-02-09 DIAGNOSIS — Z23 Encounter for immunization: Secondary | ICD-10-CM | POA: Diagnosis not present

## 2021-02-09 DIAGNOSIS — F3340 Major depressive disorder, recurrent, in remission, unspecified: Secondary | ICD-10-CM | POA: Diagnosis not present

## 2021-02-09 DIAGNOSIS — Z8673 Personal history of transient ischemic attack (TIA), and cerebral infarction without residual deficits: Secondary | ICD-10-CM | POA: Diagnosis not present

## 2021-02-09 DIAGNOSIS — Z0000 Encounter for general adult medical examination without abnormal findings: Secondary | ICD-10-CM | POA: Diagnosis not present

## 2021-02-09 DIAGNOSIS — K219 Gastro-esophageal reflux disease without esophagitis: Secondary | ICD-10-CM | POA: Diagnosis not present

## 2021-02-09 DIAGNOSIS — N1832 Chronic kidney disease, stage 3b: Secondary | ICD-10-CM | POA: Diagnosis not present

## 2021-02-09 DIAGNOSIS — N3010 Interstitial cystitis (chronic) without hematuria: Secondary | ICD-10-CM | POA: Diagnosis not present

## 2021-02-10 NOTE — Progress Notes (Signed)
Radiation Oncology         [phone] ________________________________  Multidisciplinary Breast Oncology Clinic [facility]) Initial Outpatient Consultation  Name: Jaclyn Lopez MRN: [id number]  Date: 02/11/2021  DOB: 22-Jul-1950  VC:BSWHQ, Thayer Jew, MD  Rolm Bookbinder, MD   REFERRING PHYSICIAN: Rolm Bookbinder, MD  DIAGNOSIS: The encounter diagnosis was Malignant neoplasm of upper-outer quadrant of right breast in female, estrogen receptor positive ([place]).  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 Malignant neoplasm of upper-outer quadrant of right breast in female, estrogen receptor positive ([place])  C50.411    Z17.0       HISTORY OF PRESENT ILLNESS::Jaclyn Lopez is a 70 y.o. female who is presenting to the office today for evaluation of her newly diagnosed breast cancer. She is accompanied by her good friend (retired Publishing rights manager). She is doing well overall.   She had routine screening mammography on 01/07/21 showing a possible abnormality in the right breast. She underwent bilateral diagnostic mammography with tomography and right breast ultrasonography at [facility] on 01/22/21 showing: mass and calcifications in the right breast highly suggestive of malignancy.   Right breast central needle core biopsy on 01/29/21 showed: grade 2 invasive mammary carcinoma measuring 0.2 cm in the greatest linear dimension and mammary carcinoma in situ. Prognostic indicators significant for: estrogen receptor, 90% positive and progesterone receptor, 70% positive, both with strong staining intensity. Proliferation marker Ki67 at 20%. HER2 negative.  Menarche: 70 years old Age at first live birth: 70 years old GP: 2 LMP: 2014 Contraceptive: Yes, in 1976 HRT: none   The patient was referred today for presentation in the multidisciplinary conference.  Radiology studies and pathology  slides were presented there for review and discussion of treatment options.  A consensus was discussed regarding potential next steps.  PREVIOUS RADIATION THERAPY: Yes, in 2005-2006 for 6 and a half weeks, she thinks Doctor was Dr. 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 Subsequent LEEP showed CIN-2 with clear margins and negative ECC   Osteopenia 11/2018   T score -1.5 FRAX 9% / 1% improved at both hips stable at spine   Stroke ([place]) 11/2019   Thyroid disease    Hyperthyroid    PAST SURGICAL HISTORY: Past Surgical History:  Procedure Laterality Date   Bladder stretch and Bx  1990   BREAST LUMPECTOMY  2005   right; cancer   BREAST LUMPECTOMY  2006   left; pre cancer   BREAST SURGERY     Reduction   BUNIONECTOMY  2007, 2011   CATARACT EXTRACTION  06/17/2020   CERVICAL CONE BIOPSY  2003   CESAREAN SECTION  [place] OF UTERUS  2009   ENDOMETRIAL ABLATION     Novasure   GANGLION CYST EXCISION Left 1985   HYSTEROSCOPY  2009   LEEP  05/2015   CIN-2 with clear margins   MASTECTOMY, PARTIAL     MOUTH SURGERY  2012   implants   TUBAL LIGATION  1984    FAMILY HISTORY:  Family History  Problem Relation Age of Onset   COPD Mother    Dementia Mother    Bipolar disorder Mother     Lung cancer Father    Cancer - Lung Father    Breast cancer Maternal Grandmother        Age 33's   Colon cancer Neg Hx    Stomach cancer Neg Hx    Pancreatic cancer Neg Hx    Esophageal cancer Neg Hx    Rectal cancer Neg Hx     SOCIAL HISTORY:  Social History   Socioeconomic History   Marital status: Divorced    Spouse name: Not on file   Number of children: 2   Years of education: Not on file   Highest education level: Not on file  Occupational History   Occupation: Pharmacist, hospital    Comment: [employer]  Tobacco Use   Smoking status: Former    Types: Cigarettes    Quit date: 05/17/1996    Years since quitting: 24.7   Smokeless tobacco: Never  Vaping Use   Vaping Use: Never used  Substance and Sexual Activity   Alcohol use: Yes    Alcohol/week: 0.0 standard drinks    Comment: once a month   Drug use: No   Sexual activity: Not Currently    Birth control/protection: Surgical    Comment: 1st intercourse 70 yo-More than 5 partners  Other Topics Concern   Not on file  Social History Narrative   Divorced   rare EtOH, no tobacco, drugs   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 Swallow whole. 30 tablet 3   atorvastatin (LIPITOR) 40 MG tablet 1 tablet     Biotin 5 MG CAPS Take 5 mg by mouth daily.     buPROPion (WELLBUTRIN XL) 150 MG 24 hr tablet Take 150 mg by mouth every morning.     calcium carbonate (TUMS - DOSED IN MG ELEMENTAL CALCIUM) 500 MG chewable tablet Chew 1  tablet by mouth daily as needed for indigestion or heartburn.     cholecalciferol (VITAMIN D3) 25 MCG (1000 UNIT) tablet Take 1,000 Units by mouth daily.     FLUoxetine (PROZAC) 20 MG capsule Take 20 mg by mouth daily.     ibuprofen (ADVIL) 200 MG tablet Take 200 mg by mouth every 6 (six) hours as needed for headache or moderate pain.     levothyroxine (SYNTHROID, LEVOTHROID) 50 MCG tablet Take 50 mcg by mouth daily.     Methen-Hyosc-Meth Blue-Na Phos (ME/NAPHOS/MB/HYO1) 81.6 MG TABS Take 1 tablet by mouth daily  as needed (urinary pain).     mirtazapine (REMERON) 15 MG tablet Take 3.75 mg by mouth at bedtime.      Omega-3 Fatty Acids (FISH OIL) 1000 MG CAPS Take by mouth.     omeprazole (PRILOSEC) 40 MG capsule Take 40 mg by mouth daily as needed (acid reflux).     zaleplon (SONATA) 10 MG capsule Take 10 mg by mouth at bedtime as needed for sleep.     No current facility-administered medications for this encounter.    REVIEW OF SYSTEMS: A 10+ POINT REVIEW OF SYSTEMS WAS OBTAINED including neurology, dermatology, psychiatry, cardiac, respiratory, lymph, extremities, GI, GU, musculoskeletal, constitutional, reproductive, HEENT. On the provided form, she reports stabbing, throbbing, and achy right shoulder blade pain, heartburn, stage 3 kidney disease, breast pain, bruising easily, and back pain. She denies any other symptoms.    PHYSICAL EXAM:   Vitals with BMI 02/11/2021  Height _0   Weight 149 lbs 5 oz  BMI 13.08  Systolic 657  Diastolic 71  Pulse 78   Lungs are clear to auscultation bilaterally. Heart has regular rate and rhythm. No palpable cervical, supraclavicular, or axillary adenopathy. Abdomen soft, non-tender, normal bowel sounds. Breast: Left breast is larger than the right with signs of breast reduction with scars in the inferior aspect of the left breast; with no palpable mass, nipple discharge, or bleeding. Right breast with scar in the upper outer quadrant / axilla from  previous lumpectomy in 2005. Patient has tattoos in place along the chest from previous radiation therapy around 2005.   KPS = 90  100 - Normal; no complaints; no evidence of disease. 90   - Able to carry on normal activity; minor signs or symptoms of disease. 80   - Normal activity with effort; some signs or symptoms of disease. 5   - Cares for self; unable to carry on normal activity or to do active work. 60   - Requires occasional assistance, but is able to care for most of his personal needs. 50   - Requires considerable assistance and frequent medical care. 46   - Disabled; requires special care and assistance. 52   - Severely disabled; hospital admission is indicated although death not imminent. 19   - Very sick; hospital admission necessary; active supportive treatment necessary. 10   - Moribund; fatal processes progressing rapidly. 0     - Dead  Karnofsky DA, Abelmann McIntosh, Craver LS and Burchenal [facility] [phone]) The use of the nitrogen mustards in the palliative treatment of carcinoma: with particular reference to bronchogenic carcinoma Cancer 1 634-56  LABORATORY DATA:  Lab Results  Component Value Date   WBC 7.8 02/11/2021   HGB 13.5 02/11/2021   HCT 40.1 02/11/2021   MCV 87.0 02/11/2021   PLT 206 02/11/2021   Lab Results  Component Value Date   NA 138 02/11/2021   K 4.5 02/11/2021   CL 104 02/11/2021   CO2 24 02/11/2021   Lab Results  Component Value Date   ALT 25 02/11/2021   AST 22 02/11/2021   ALKPHOS 116 02/11/2021   BILITOT 0.7 02/11/2021    PULMONARY FUNCTION TEST:   Recent Review Flowsheet Data   There is no flowsheet data to display.     RADIOGRAPHY: No results found.    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 we discussed with the patient  and her friend that the standard of care would be to consider mastectomy in this situation although a lumpectomy and aromatase inhibitor would be very reasonable with her age. We discussed the studies documenting the safety of omitting radiation therapy with a good prognosis.   With the patient's history of 2 breast cancers, and a history of pre-invasive cancer on the left side, She is leaning towards mastectomy; she would feel more comfortable with this approach. She is also comfortable with genetic testing.   PLAN:  Genetics  Surgery to be determined with oncotype   Aromatase Inhibitor   ------------------------------------------------  Blair Promise, PhD, MD  This document serves as a record of services personally performed by Gery Pray, MD. It was created on his behalf by Roney Mans, a trained medical scribe. The creation of this record is based on the scribe's personal observations and the provider's statements to them. This document has been checked and approved by the attending provider.

## 2021-02-10 NOTE — Progress Notes (Signed)
[place] NOTE  Patient Care Team: Deland Pretty, MD as PCP - General (Internal Medicine) Mauro Kaufmann, RN as Oncology Nurse Navigator Rockwell Germany, RN as Oncology Nurse Navigator Rolm Bookbinder, MD as Consulting Physician (General Surgery) Nicholas Lose, MD as Consulting Physician (Hematology and Oncology) Gery Pray, MD as Consulting Physician (Radiation Oncology)  CHIEF COMPLAINTS/PURPOSE OF CONSULTATION:  Newly diagnosed breast cancer  HISTORY OF PRESENTING ILLNESS:  Jaclyn Lopez 70 y.o. female is here because of recent diagnosis of invasive mammary carcinoma of the right breast. Screening mammogram on 01/07/2021 showed focal asymmetry and calcifications in the right breast. Diagnostic mammogram and [place] on 01/22/2021 showed irregular mass in the right breast. Biopsy on 01/29/2021 showed invasive mammary carcinoma and mammary carcinoma in situ, ER+(90%)/PR+(70%)/Her2-. She presents to the clinic today for initial evaluation and discussion of treatment options.   I reviewed her records extensively and collaborated the history with the patient.  SUMMARY OF ONCOLOGIC HISTORY: Oncology History  Malignant neoplasm of upper-outer quadrant of right breast in female, estrogen receptor positive ([place])  02/04/2021 Initial Diagnosis   Screening mammogram: focal asymmetry and calcifications in the right breast. Diagnostic mammogram: irregular mass. Biopsy: invasive mammary carcinoma and mammary carcinoma in situ, ER+(90%)/PR+(70%)/Her2-.  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 Subsequent LEEP showed CIN-2 with clear margins and negative ECC   Osteopenia 11/2018   T score -1.5 FRAX 9% / 1% improved at both hips stable at spine   Stroke ([place]) 11/2019   Thyroid disease    Hyperthyroid    SURGICAL HISTORY: Past Surgical History:  Procedure Laterality Date   Bladder stretch and Bx  1990   BREAST LUMPECTOMY  2005   right; cancer   BREAST LUMPECTOMY  2006   left; pre cancer   BREAST SURGERY     Reduction   BUNIONECTOMY  2007, 2011   CATARACT EXTRACTION  06/17/2020   CERVICAL CONE BIOPSY  2003   CESAREAN SECTION  1983   DILATION AND CURETTAGE OF UTERUS  2009   ENDOMETRIAL ABLATION     Novasure   GANGLION CYST EXCISION Left 1985   HYSTEROSCOPY  2009   LEEP  05/2015   CIN-2 with clear margins   MASTECTOMY, PARTIAL     MOUTH SURGERY  2012   implants   TUBAL LIGATION  1984    SOCIAL HISTORY: Social History   Socioeconomic History   Marital status: Divorced    Spouse name: Not on file   Number of children: 2   Years of education: Not on file   Highest education level: Not on file  Occupational History   Occupation: Pharmacist, hospital    Comment:  [employer]  Tobacco Use   Smoking status: Former    Types: Cigarettes    Quit date: 05/17/1996    Years since quitting: 24.7   Smokeless tobacco: Never  Vaping Use   Vaping Use: Never used  Substance and Sexual Activity   Alcohol use: Yes    Alcohol/week: 0.0 standard drinks    Comment: once a month   Drug use: No   Sexual activity: Not Currently    Birth control/protection: Surgical    Comment: 1st intercourse 70 yo-More than 5 partners  Other Topics  Concern   Not on file  Social History Narrative   Divorced   rare EtOH, no tobacco, drugs   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   Social Determinants of Health   Financial Resource Strain: Not on file  Food Insecurity: Not on file  Transportation Needs: Not on file  Physical Activity: Not on file  Stress: Not on file  Social Connections: Not on file  Intimate Partner Violence: Not on file    FAMILY HISTORY: Family History  Problem Relation Age of Onset   COPD Mother    Dementia Mother    Bipolar disorder Mother    Lung cancer Father    Cancer - Lung Father    Breast cancer Maternal Grandmother        Age 8's   Colon cancer Neg Hx    Stomach cancer Neg Hx    Pancreatic cancer Neg Hx    Esophageal cancer Neg Hx    Rectal cancer Neg Hx     ALLERGIES:  is allergic to symmetrel [amantadine hcl] and tamiflu [oseltamivir phosphate].  MEDICATIONS:  Current Outpatient Medications  Medication Sig Dispense Refill   aspirin EC 81 MG EC tablet Take 1 tablet (81 mg total) by mouth daily. Swallow whole. 30 tablet 3   Biotin 5 MG CAPS Take 5 mg by mouth daily.     buPROPion (WELLBUTRIN XL) 150 MG 24 hr tablet Take 150 mg by mouth every morning.     calcium carbonate (TUMS - DOSED IN MG ELEMENTAL CALCIUM) 500 MG chewable tablet Chew 1 tablet by mouth daily as needed for indigestion or heartburn.     cholecalciferol (VITAMIN D3) 25 MCG (1000 UNIT) tablet Take 1,000 Units by mouth daily.     FLUoxetine (PROZAC) 20 MG capsule Take 20 mg by mouth daily.     ibuprofen (ADVIL) 200 MG tablet Take 200 mg by mouth every 6 (six) hours as needed for headache or moderate pain.     levothyroxine (SYNTHROID, LEVOTHROID) 50 MCG tablet Take 50 mcg by mouth daily.     Methen-Hyosc-Meth Blue-Na Phos (ME/NAPHOS/MB/HYO1) 81.6 MG TABS Take 1 tablet by mouth daily as needed (urinary pain).     mirtazapine (REMERON) 15 MG tablet Take 3.75 mg by mouth at bedtime.      Omega-3 Fatty Acids (FISH OIL)  1000 MG CAPS Take by mouth.     omeprazole (PRILOSEC) 40 MG capsule Take 40 mg by mouth daily as needed (acid reflux).     zaleplon (SONATA) 10 MG capsule Take 10 mg by mouth at bedtime as needed for sleep.     atorvastatin (LIPITOR) 40 MG tablet 1 tablet     No current facility-administered medications for this visit.    REVIEW OF SYSTEMS:   Constitutional: Denies fevers, chills or abnormal night sweats All other systems were reviewed with the patient and are negative.  PHYSICAL EXAMINATION: ECOG PERFORMANCE STATUS: 0 - Asymptomatic  Vitals:   02/11/21 1236  BP: 131/71  Pulse: 78  Resp: 18  Temp: 97.7 F (36.5 C)  SpO2: 99%   Filed Weights   02/11/21 1236  Weight: 149 lb 4.8 oz (67.7 kg)    LABORATORY DATA:  I have reviewed the data as listed Lab Results  Component Value Date   WBC 7.8 02/11/2021   HGB 13.5 02/11/2021   HCT 40.1 02/11/2021   MCV 87.0 02/11/2021   PLT 206 02/11/2021   Lab Results  Component Value Date   NA 138 02/11/2021   K 4.5 02/11/2021   CL 104 02/11/2021   CO2 24 02/11/2021    RADIOGRAPHIC STUDIES: I have personally reviewed the radiological reports and agreed with the findings in the report.  ASSESSMENT AND PLAN:  Malignant neoplasm of upper-outer quadrant of right breast in female, estrogen receptor positive ([place]) Screening mammogram: focal asymmetry and calcifications in the right breast. Diagnostic mammogram: irregular mass. Biopsy: invasive mammary carcinoma and mammary carcinoma in situ, ER+(90%)/PR+(70%)/Her2-.  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 After reviewing the pathology in detail, we proceeded to discuss the different treatment options between surgery, radiation,  chemotherapy, antiestrogen therapies.  Recommendations: 1.  Bilateral mastectomies with sentinel lymph node biopsy 2. Oncotype DX testing to determine if chemotherapy would be of any benefit followed by 3. Adjuvant antiestrogen therapy  Oncotype counseling: I discussed Oncotype DX test. I explained to the patient that this is a 21 gene panel to evaluate patient tumors DNA to calculate recurrence score. This would help determine whether patient has high risk or low risk breast cancer. She understands that if her tumor was found to be high risk, she would benefit from systemic chemotherapy. If low risk, no need of chemotherapy.  Return to clinic after surgery to discuss final pathology report and then determine if Oncotype DX testing will need to be sent.    All questions were answered. The patient knows to call the clinic with any problems, questions or concerns.   Rulon Eisenmenger, MD, MPH 02/11/2021    I, Thana Ates, am acting as scribe for Nicholas Lose, MD.  I have reviewed the above documentation for accuracy and completeness, and I agree with the above.

## 2021-02-11 ENCOUNTER — Other Ambulatory Visit: Payer: Self-pay

## 2021-02-11 ENCOUNTER — Inpatient Hospital Stay: Payer: Medicare PPO | Attending: Hematology and Oncology

## 2021-02-11 ENCOUNTER — Inpatient Hospital Stay (HOSPITAL_BASED_OUTPATIENT_CLINIC_OR_DEPARTMENT_OTHER): Payer: Medicare PPO | Admitting: Hematology and Oncology

## 2021-02-11 ENCOUNTER — Ambulatory Visit (HOSPITAL_BASED_OUTPATIENT_CLINIC_OR_DEPARTMENT_OTHER): Payer: Medicare PPO | Admitting: Genetic Counselor

## 2021-02-11 ENCOUNTER — Ambulatory Visit: Payer: Medicare PPO | Attending: General Surgery | Admitting: Physical Therapy

## 2021-02-11 ENCOUNTER — Ambulatory Visit
Admission: RE | Admit: 2021-02-11 | Discharge: 2021-02-11 | Disposition: A | Discharge status: Home / Self Care | Payer: Medicare PPO | Source: Ambulatory Visit | Attending: Radiation Oncology | Admitting: Radiation Oncology

## 2021-02-11 ENCOUNTER — Encounter: Payer: Self-pay | Admitting: *Deleted

## 2021-02-11 ENCOUNTER — Encounter: Payer: Self-pay | Admitting: Physical Therapy

## 2021-02-11 DIAGNOSIS — Z853 Personal history of malignant neoplasm of breast: Secondary | ICD-10-CM | POA: Diagnosis not present

## 2021-02-11 DIAGNOSIS — Z87891 Personal history of nicotine dependence: Secondary | ICD-10-CM | POA: Insufficient documentation

## 2021-02-11 DIAGNOSIS — Z818 Family history of other mental and behavioral disorders: Secondary | ICD-10-CM | POA: Insufficient documentation

## 2021-02-11 DIAGNOSIS — Z803 Family history of malignant neoplasm of breast: Secondary | ICD-10-CM | POA: Diagnosis not present

## 2021-02-11 DIAGNOSIS — Z8616 Personal history of COVID-19: Secondary | ICD-10-CM

## 2021-02-11 DIAGNOSIS — Z8673 Personal history of transient ischemic attack (TIA), and cerebral infarction without residual deficits: Secondary | ICD-10-CM | POA: Insufficient documentation

## 2021-02-11 DIAGNOSIS — Z836 Family history of other diseases of the respiratory system: Secondary | ICD-10-CM | POA: Diagnosis not present

## 2021-02-11 DIAGNOSIS — G8929 Other chronic pain: Secondary | ICD-10-CM | POA: Diagnosis not present

## 2021-02-11 DIAGNOSIS — Z170 Estrogen receptor positive status [ER+]: Secondary | ICD-10-CM | POA: Insufficient documentation

## 2021-02-11 DIAGNOSIS — Z8042 Family history of malignant neoplasm of prostate: Secondary | ICD-10-CM

## 2021-02-11 DIAGNOSIS — R293 Abnormal posture: Secondary | ICD-10-CM | POA: Insufficient documentation

## 2021-02-11 DIAGNOSIS — C50411 Malignant neoplasm of upper-outer quadrant of right female breast: Secondary | ICD-10-CM | POA: Insufficient documentation

## 2021-02-11 DIAGNOSIS — Z79899 Other long term (current) drug therapy: Secondary | ICD-10-CM

## 2021-02-11 DIAGNOSIS — M25511 Pain in right shoulder: Secondary | ICD-10-CM | POA: Diagnosis not present

## 2021-02-11 DIAGNOSIS — Z801 Family history of malignant neoplasm of trachea, bronchus and lung: Secondary | ICD-10-CM | POA: Diagnosis not present

## 2021-02-11 LAB — CBC WITH DIFFERENTIAL (CANCER CENTER ONLY)
Abs Immature Granulocytes: 0.01 10*3/uL (ref 0.00–0.07)
Basophils Absolute: 0.1 10*3/uL (ref 0.0–0.1)
Basophils Relative: 1 %
Eosinophils Absolute: 0.1 10*3/uL (ref 0.0–0.5)
Eosinophils Relative: 1 %
HCT: 40.1 % (ref 36.0–46.0)
Hemoglobin: 13.5 g/dL (ref 12.0–15.0)
Immature Granulocytes: 0 %
Lymphocytes Relative: 23 %
Lymphs Abs: 1.8 10*3/uL (ref 0.7–4.0)
MCH: 29.3 pg (ref 26.0–34.0)
MCHC: 33.7 g/dL (ref 30.0–36.0)
MCV: 87 fL (ref 80.0–100.0)
Monocytes Absolute: 0.7 10*3/uL (ref 0.1–1.0)
Monocytes Relative: 9 %
Neutro Abs: 5.1 10*3/uL (ref 1.7–7.7)
Neutrophils Relative %: 66 %
Platelet Count: 206 10*3/uL (ref 150–400)
RBC: 4.61 MIL/uL (ref 3.87–5.11)
RDW: 13.4 % (ref 11.5–15.5)
WBC Count: 7.8 10*3/uL (ref 4.0–10.5)
nRBC: 0 % (ref 0.0–0.2)

## 2021-02-11 LAB — CMP (CANCER CENTER ONLY)
ALT: 25 U/L (ref 0–44)
AST: 22 U/L (ref 15–41)
Albumin: 4.1 g/dL (ref 3.5–5.0)
Alkaline Phosphatase: 116 U/L (ref 38–126)
Anion gap: 10 (ref 5–15)
BUN: 19 mg/dL (ref 8–23)
CO2: 24 mmol/L (ref 22–32)
Calcium: 9.5 mg/dL (ref 8.9–10.3)
Chloride: 104 mmol/L (ref 98–111)
Creatinine: 1.14 mg/dL — ABNORMAL HIGH (ref 0.44–1.00)
GFR, Estimated: 52 mL/min — ABNORMAL LOW (ref >60)
Glucose, Bld: 103 mg/dL — ABNORMAL HIGH (ref 70–99)
Potassium: 4.5 mmol/L (ref 3.5–5.1)
Sodium: 138 mmol/L (ref 135–145)
Total Bilirubin: 0.7 mg/dL (ref 0.3–1.2)
Total Protein: 7 g/dL (ref 6.5–8.1)

## 2021-02-11 LAB — GENETIC SCREENING ORDER

## 2021-02-11 NOTE — Therapy (Signed)
[place], [place], [address] Phone: [phone]   Fax:  [phone]  Physical Therapy Evaluation  Patient Details  Name: Jaclyn Lopez MRN: [id number] Date of Birth: 1950-10-21 Referring Provider (PT): Dr. Rolm Bookbinder   Encounter Date: 02/11/2021   PT End of Session - 02/11/21 2036     Visit Number 1    Number of Visits 2    Date for PT Re-Evaluation 04/08/21    PT Start Time 1352    PT Stop Time 1412   Also saw pt from [phone] for a total of 47 min   PT Time Calculation (min) 20 min    Activity Tolerance Patient tolerated treatment well    Behavior During Therapy [facility] for tasks assessed/performed             Past Medical History:  Diagnosis Date   Atypical glandular cells on Pap smear 2003   Cancer [facility]) 2005   Breast-Ductal CIS Right breast-Radiation - no lymph nodes removed per pt   CKD (chronic kidney disease)    CVA (cerebral vascular accident) ([place])    Depression    GERD (gastroesophageal reflux disease)    Herpes progenitalis    History of COVID-19 10/15/2020   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 Subsequent LEEP showed CIN-2 with clear margins and negative ECC   Osteopenia 11/2018   T score -1.5 FRAX 9% / 1% improved at both hips stable at spine   Stroke [facility]) 11/2019   Thyroid disease    Hyperthyroid    Past Surgical History:  Procedure Laterality Date   Bladder stretch and Bx  1990   BREAST LUMPECTOMY  2005   right; cancer   BREAST LUMPECTOMY  2006   left; pre cancer   BREAST SURGERY     Reduction   BUNIONECTOMY  2007, 2011   CATARACT EXTRACTION  06/17/2020   CERVICAL CONE  BIOPSY  2003   CESAREAN SECTION  1983   DILATION AND CURETTAGE OF UTERUS  2009   ENDOMETRIAL ABLATION     Novasure   GANGLION CYST EXCISION Left 1985   HYSTEROSCOPY  2009   LEEP  05/2015   CIN-2 with clear margins   MASTECTOMY, PARTIAL     MOUTH SURGERY  2012   implants   TUBAL LIGATION  1984    There were no vitals filed for this visit.    Subjective Assessment - 02/11/21 2007     Subjective Patient reports she is here today to be seen by her medical team for her newly diagnsed right breast cancer. She also reports left scapular pain over the past 4 weeks for unknown reasons which she reports comes and goes and increases with reaching.    Pertinent History Patient was diagnosed on 01/07/2021 with right grade II invasive ductal carcinoma breast cancer. It measures 1 cm with calcifications in the upper outer quadrant. It is ER/PR positive and HER2 negative with a Ki67 of 20%. She has a previous history of right breast DCIS with a lumpectomy and radiation in 2005.    Patient Stated Goals Decrease scapular pain and reduce lymphedema risk    Currently in  Pain? Yes    Pain Score 7     Pain Location Scapula    Pain Orientation Left    Pain Descriptors / Indicators Aching    Pain Type Chronic pain    Pain Onset 1 to 4 weeks ago    Pain Frequency Intermittent    Aggravating Factors  Reaching    Pain Relieving Factors Not reaching                [facility] PT Assessment - 02/11/21 0001       Assessment   Medical Diagnosis Right breast cancer and right scapula pain    Referring Provider (PT) Dr. Rolm Bookbinder    Onset Date/Surgical Date 01/07/21    Hand Dominance Right    Prior Therapy none      Precautions   Precautions Other (comment)    Precaution Comments active cancer      Restrictions   Weight Bearing Restrictions No      Balance Screen   Has the patient fallen in the past 6 months No    Has the patient had a decrease in activity level because of a fear of  falling?  No    Is the patient reluctant to leave their home because of a fear of falling?  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    Leisure She walks on a treadmill 3x/week for 35 min and does some weights      Cognition   Overall Cognitive Status Within Functional Limits for tasks assessed      Posture/Postural Control   Posture/Postural Control Postural limitations    Postural Limitations Rounded Shoulders;Forward head;Increased thoracic kyphosis      ROM / Strength   AROM / PROM / Strength AROM;Strength      AROM   Overall AROM Comments Cervical AROM is WNL    AROM Assessment Site Shoulder    Right/Left Shoulder Right;Left    Right Shoulder Extension 43 Degrees    Right Shoulder Flexion 150 Degrees    Right Shoulder ABduction 151 Degrees    Right Shoulder Internal Rotation 60 Degrees    Right Shoulder External Rotation 90 Degrees    Left Shoulder Extension 49 Degrees    Left Shoulder Flexion 130 Degrees    Left Shoulder ABduction 151 Degrees    Left Shoulder Internal Rotation 59 Degrees    Left Shoulder External Rotation 85 Degrees      Strength   Overall Strength Within functional limits for tasks performed      Palpation   Palpation comment Palpable and visible tightness present in right rhomboid and intrascapular region; very little to no scapular mobility present on right side               LYMPHEDEMA/ONCOLOGY QUESTIONNAIRE - 02/11/21 0001       Type   Cancer Type Right breast cancer      Lymphedema Assessments   Lymphedema Assessments Upper extremities      Right Upper Extremity Lymphedema   10 cm Proximal to Olecranon Process 27.7 cm    Olecranon Process 24.2 cm    10 cm Proximal to Ulnar Styloid Process  20.4 cm    Just Proximal  to Ulnar Styloid Process 14.3 cm    Across Hand at [employer] 17.7 cm    At [place] of 2nd Digit 6 cm      Left Upper Extremity Lymphedema   10 cm Proximal to Olecranon Process 27 cm    Olecranon Process 23.8 cm    10 cm Proximal to Ulnar Styloid Process 19.5 cm    Just Proximal to Ulnar Styloid Process 14.8 cm    Across Hand at [employer] 17.8 cm    At [place] of 2nd Digit 5.9 cm             L-DEX FLOWSHEETS - 02/11/21 2000       L-DEX LYMPHEDEMA SCREENING   Measurement Type Unilateral    L-DEX MEASUREMENT EXTREMITY Upper Extremity    POSITION  Standing    DOMINANT SIDE Right    At Risk Side Right    BASELINE SCORE (UNILATERAL) 0.5             The patient was assessed using the L-Dex machine today to produce a lymphedema index baseline score. The patient will be reassessed on a regular basis (typically every 3 months) to obtain new L-Dex scores. If the score is > 6.5 points away from his/her baseline score indicating onset of subclinical lymphedema, it will be recommended to wear a compression garment for 4 weeks, 12 hours per day and then be reassessed. If the score continues to be > 6.5 points from baseline at reassessment, we will initiate lymphedema treatment. Assessing in this manner has a 95% rate of preventing clinically significant lymphedema.      Katina Dung - 02/11/21 0001     Open a tight or new jar No difficulty    Do heavy household chores (wash walls, wash floors) Mild difficulty    Carry a shopping bag or briefcase No difficulty    Wash your back Mild difficulty    Use a knife to cut food No difficulty    Recreational activities in which you take some force or impact through your arm, shoulder, or hand (golf, hammering, tennis) Moderate difficulty    During the past week, to what extent has your arm, shoulder or hand problem interfered with your normal social activities with family, friends, neighbors, or groups? Slightly     During the past week, to what extent has your arm, shoulder or hand problem limited your work or other regular daily activities Slightly    Arm, shoulder, or hand pain. Moderate    Tingling (pins and needles) in your arm, shoulder, or hand None    Difficulty Sleeping Mild difficulty    DASH Score 20.45 %              Objective measurements completed on examination: See above findings.         Patient was instructed today in a home exercise program today for post op shoulder range of motion. These included active assist shoulder flexion in sitting, scapular retraction, wall walking with shoulder abduction, and hands behind head external rotation.  She was encouraged to do these twice a day, holding 3 seconds and repeating 5 times when permitted by her physician.         PT Education - 02/11/21 2035     Education Details Lymphedema risk reduction and post op shoulder ROM HEP    Person(s) Educated Patient    Methods Explanation;Demonstration;Handout    Comprehension Returned demonstration;Verbalized understanding  PT Short Term Goals - 02/11/21 2046       PT SHORT TERM GOAL #1   Title Patient will be independent with a home exercise program.    Time 4    Period Weeks    Status New      PT SHORT TERM GOAL #2   Title Patient will demosntrate proper sitting and standing posture for daily tasks.    Time 4    Period Weeks    Status New    Target Date 03/11/21      PT SHORT TERM GOAL #3   Title Patient will improve her DASH score to ,/= 10 for improved overall UE function.    Baseline 20    Time 4    Period Weeks    Status New    Target Date 03/11/21      PT SHORT TERM GOAL #4   Title Patient will report >/= 25% less pain to tolerate daily tasks with greater ease.    Time 4    Period Weeks    Status New    Target Date 03/11/21               PT Long Term Goals - 02/11/21 2045       PT LONG TERM GOAL #1   Title Patient will  demonstrate she has regained full shoulder ROM and function post operatively compared to baselines.    Time 8    Period Weeks    Status New    Target Date 04/08/21             Breast Clinic Goals - 02/11/21 2044       Patient will be able to verbalize understanding of pertinent lymphedema risk reduction practices relevant to her diagnosis specifically related to skin care.   Time 1    Period Days    Status Achieved      Patient will be able to return demonstrate and/or verbalize understanding of the post-op home exercise program related to regaining shoulder range of motion.   Time 1    Period Days    Status Achieved      Patient will be able to verbalize understanding of the importance of attending the postoperative After Breast Cancer Class for further lymphedema risk reduction education and therapeutic exercise.   Time 1    Period Days    Status Achieved                   Plan - 02/11/21 1955     Clinical Impression Statement Patient was diagnosed on 01/07/2021 with right grade II invasive ductal carcinoma breast cancer. It measures 1 cm with calcifications in the upper outer quadrant. It is ER/PR positive and HER2 negative with a Ki67 of 20%. She has a previous history of right breast DCIS with a lumpectomy and radiation in 2005. She also has right scapular pain for unknown reasons which appears to be muscular. Her multidisciplinary medical team met prior to her assessments to determine a recommended treatment plan. She will benefit from a post op PT reassessment to determine needs and from L-Dex screens every 3 months for 2 years to detect subclinical lymphedema. She will benefit from PT now to address scapular pain and tightness so she has improved outcomes post surgically.    Stability/Clinical Decision Making Stable/Uncomplicated    Clinical Decision Making Low    Rehab Potential Excellent    PT Frequency 2x / week    PT  Duration 4 weeks   Followed by 1  reassessment post op   PT Treatment/Interventions ADLs/Self Care Home Management;Therapeutic exercise;Patient/family education;Passive range of motion;Manual techniques;Dry needling    PT Next Visit Plan Begin soft tissue work and consider dry needling for right scapular area; scapular mobs    PT Home Exercise Plan Post op HEP    Consulted and Agree with Plan of Care Patient;Family member/caregiver    Family Member Consulted Boyfriend             Patient will benefit from skilled therapeutic intervention in order to improve the following deficits and impairments:  Postural dysfunction, Decreased range of motion, Decreased knowledge of precautions, Impaired UE functional use, Pain  Visit Diagnosis: Malignant neoplasm of upper-outer quadrant of right breast in female, estrogen receptor positive ([place]) - Plan: PT plan of care cert/re-cert  Abnormal posture - Plan: PT plan of care cert/re-cert  Chronic right shoulder pain - Plan: PT plan of care cert/re-cert   Patient will follow up at outpatient cancer rehab 3-4 weeks following surgery.  If the patient requires physical therapy at that time, a specific plan will be dictated and sent to the referring physician for approval. The patient was educated today on appropriate basic range of motion exercises to begin post operatively and the importance of attending the After Breast Cancer class following surgery.  Patient was educated today on lymphedema risk reduction practices as it pertains to recommendations that will benefit the patient immediately following surgery.  She verbalized good understanding.     Problem List Patient Active Problem List   Diagnosis Date Noted   Malignant neoplasm of upper-outer quadrant of right breast in female, estrogen receptor positive ([place]) 02/04/2021   CVA (cerebral vascular accident) ([place]) 11/29/2019   Depression 11/29/2019   CKD (chronic kidney disease) stage 3, GFR 30-59 ml/min ([place]) 11/29/2019   Nausea  and vomiting 03/15/2019   Gastroesophageal reflux disease 03/15/2019   Loose stools 03/15/2019   Elevated alkaline phosphatase level 03/15/2019   Herpes progenitalis    Osteopenia    Hypothyroidism    IC (interstitial cystitis)    DUCTAL CARCINOMA IN SITU, RIGHT BREAST 03/12/2008   DIVERTICULOSIS OF COLON 03/12/2008   Annia Friendly, PT 02/11/21 8:51 PM   [facility] [address] [place], [place], [address] Phone: [phone]   Fax:  [phone]  Name: DEKLYNN CHARLET MRN: [id number] Date of Birth: 12/05/1950

## 2021-02-11 NOTE — Patient Instructions (Signed)

## 2021-02-11 NOTE — Assessment & Plan Note (Signed)
Screening mammogram: focal asymmetry and calcifications in the right breast. Diagnostic mammogram: irregular mass. Biopsy: invasive mammary carcinoma and mammary carcinoma in situ, ER+(90%)/PR+(70%)/Her2-.  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 After reviewing the pathology in detail, we proceeded to discuss the different treatment options between surgery, radiation, chemotherapy, antiestrogen therapies.  Recommendations: 1.  Bilateral mastectomies with sentinel lymph node biopsy 2. Oncotype DX testing to determine if chemotherapy would be of any benefit followed by 3. Adjuvant antiestrogen therapy  Oncotype counseling: I discussed Oncotype DX test. I explained to the patient that this is a 21 gene panel to evaluate patient tumors DNA to calculate recurrence score. This would help determine whether patient has high risk or low risk breast cancer. She understands that if her tumor was found to be high risk, she would benefit from systemic chemotherapy. If low risk, no need of chemotherapy.  Return to clinic after surgery to discuss final pathology report and then determine if Oncotype DX testing will need to be sent.   

## 2021-02-12 ENCOUNTER — Encounter: Payer: Self-pay | Admitting: Genetic Counselor

## 2021-02-12 ENCOUNTER — Other Ambulatory Visit: Payer: Self-pay | Admitting: General Surgery

## 2021-02-12 ENCOUNTER — Encounter: Payer: Self-pay | Admitting: General Practice

## 2021-02-12 DIAGNOSIS — Z803 Family history of malignant neoplasm of breast: Secondary | ICD-10-CM | POA: Insufficient documentation

## 2021-02-12 DIAGNOSIS — Z8042 Family history of malignant neoplasm of prostate: Secondary | ICD-10-CM | POA: Insufficient documentation

## 2021-02-12 DIAGNOSIS — Z853 Personal history of malignant neoplasm of breast: Secondary | ICD-10-CM | POA: Insufficient documentation

## 2021-02-12 HISTORY — DX: Family history of malignant neoplasm of breast: Z80.3

## 2021-02-12 HISTORY — DX: Family history of malignant neoplasm of prostate: Z80.42

## 2021-02-12 HISTORY — DX: Personal history of malignant neoplasm of breast: Z85.3

## 2021-02-12 NOTE — Progress Notes (Signed)
REFERRING PROVIDER: Nicholas Lose, MD [place],   [id number]  PRIMARY PROVIDER:  Deland Pretty, MD  PRIMARY REASON FOR VISIT:  1. Malignant neoplasm of upper-outer quadrant of right breast in female, estrogen receptor positive ([place])   2. Family history of breast cancer   3. Personal history of breast cancer   4. Family history of prostate cancer     HISTORY OF PRESENT ILLNESS:   Jaclyn Lopez, a 70 y.o. female, was seen for a [facility] cancer genetics consultation during the breast multidisciplinary clinic at the request of Dr. Lindi Adie due to a personal and family history of cancer.  Jaclyn Lopez presents to clinic today to discuss the possibility of a hereditary predisposition to cancer, to discuss genetic testing, and to further clarify her future cancer risks, as well as potential cancer risks for family members.   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 In September 2022, at the age of 20, Jaclyn Lopez was diagnosed with invasive mammary carcinoma.  The preliminary treatment plan includes bilateral mastectomies, Oncotype to determine potential benefit of chemotherapy, and adjuvant anti-estrogens.    CANCER HISTORY:  Oncology History  Malignant neoplasm of upper-outer quadrant of right breast in female, estrogen receptor positive ([place])  02/04/2021 Initial Diagnosis   Screening mammogram: focal asymmetry and calcifications in the right breast. Diagnostic mammogram: irregular mass. Biopsy: invasive mammary carcinoma and mammary carcinoma in situ, ER+(90%)/PR+(70%)/Her2-.  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  First live birth at age 25.  OCP use for approximately  1  year.  Ovaries intact: yes.  Hysterectomy: no.  Menopausal status: postmenopausal.  HRT use: 0 years. Colonoscopy: yes;  most recent in 2021 . Mammogram within the last year: yes. Up to date with pelvic exams: yes; most recent in 2021   Past Medical History:  Diagnosis Date   Atypical glandular cells on Pap smear 2003   Cancer [facility]) 2005   Breast-Ductal CIS Right breast-Radiation - no lymph nodes removed per pt   CKD (chronic kidney disease)    CVA (cerebral vascular accident) (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 Subsequent LEEP showed CIN-2 with clear margins and negative ECC   Osteopenia 11/2018   T score -1.5 FRAX 9% / 1% improved at both hips stable at spine   Personal history of breast cancer 02/12/2021  Stroke [facility]) 11/2019   Thyroid disease    Hyperthyroid    Past Surgical History:  Procedure Laterality Date   Bladder stretch and Bx  1990   BREAST LUMPECTOMY  2005   right; cancer   BREAST LUMPECTOMY  2006   left; pre cancer   BREAST SURGERY     Reduction   BUNIONECTOMY  2007, 2011   CATARACT EXTRACTION  06/17/2020   CERVICAL CONE BIOPSY  2003    CESAREAN SECTION  1983   DILATION AND CURETTAGE OF UTERUS  2009   ENDOMETRIAL ABLATION     Novasure   GANGLION CYST EXCISION Left 1985   HYSTEROSCOPY  2009   LEEP  05/2015   CIN-2 with clear margins   MASTECTOMY, PARTIAL     MOUTH SURGERY  2012   implants   TUBAL LIGATION  1984    FAMILY HISTORY:  We obtained a detailed, 4-generation family history.  Significant diagnoses are listed below: Family History  Problem Relation Age of Onset   Lung cancer Father 24       smoking hx   Prostate cancer Maternal Uncle        dx > 65   Breast cancer Maternal Grandmother 90    Jaclyn Lopez is unaware of previous family history of genetic testing for hereditary cancer risks.  There is no reported Ashkenazi Jewish ancestry. There is no known consanguinity.  GENETIC COUNSELING ASSESSMENT: Jaclyn Lopez is a 70 y.o. female with a personal and family history of cancer which is somewhat suggestive of a hereditary cancer syndrome and predisposition to cancer the presence of related cancers in her family. We, therefore, discussed and recommended the following at today's visit.   DISCUSSION: We discussed that 5 - 10% of cancer is hereditary, with most cases of hereditary breast cancer associated with mutations in BRCA1/2.  There are other genes that can be associated with hereditary breast and prostate cancer syndromes.  Type of cancer risk and level of risk are gene-specific. We discussed that testing is beneficial for several reasons including knowing how to follow individuals after completing their treatment, identifying whether potential treatment options would be beneficial, and understanding if other family members could be at risk for cancer and allowing them to undergo genetic testing.   We reviewed the characteristics, features and inheritance patterns of hereditary cancer syndromes. We also discussed genetic testing, including the appropriate family members to test, the process of testing, insurance  coverage and turn-around-time for results. We discussed the implications of a negative, positive and/or variant of uncertain significant result. In order to get genetic test results in a timely manner so that Jaclyn Lopez can use these genetic test results for surgical decisions, we recommended Jaclyn Lopez pursue genetic testing for the [employer].  The BRCAplus panel offered by [employer] and includes sequencing and deletion/duplication analysis for the following 8 genes: ATM, BRCA1, BRCA2, CDH1, CHEK2, PALB2, PTEN, and TP53. Once complete, we recommend Jaclyn Lopez pursue reflex genetic testing to a more comprehensive gene panel.   Jaclyn Lopez  was offered a common hereditary cancer panel (47 genes) and an expanded pan-cancer panel (77 genes). Jaclyn Lopez was informed of the benefits and limitations of each panel, including that expanded pan-cancer panels contain genes that do not have clear management guidelines at this point in time.  We also discussed that as the number of genes included on a panel increases, the chances of variants of uncertain significance increases.  After considering the  benefits and limitations of each gene panel, Jaclyn Lopez  elected to have an expanded Radio broadcast assistant through [place].  The CancerNext-Expanded gene panel offered by [facility] and includes sequencing, rearrangement, and RNA analysis for the following 77 genes: AIP, ALK, APC, ATM, AXIN2, BAP1, BARD1, BLM, BMPR1A, BRCA1, BRCA2, BRIP1, CDC73, CDH1, CDK4, CDKN1B, CDKN2A, CHEK2, CTNNA1, DICER1, FANCC, FH, FLCN, GALNT12, KIF1B, LZTR1, MAX, MEN1, MET, MLH1, MSH2, MSH3, MSH6, MUTYH, NBN, NF1, NF2, NTHL1, PALB2, PHOX2B, PMS2, POT1, PRKAR1A, PTCH1, PTEN, RAD51C, RAD51D, RB1, RECQL, RET, SDHA, SDHAF2, SDHB, SDHC, SDHD, SMAD4, SMARCA4, SMARCB1, SMARCE1, STK11, SUFU, TMEM127, TP53, TSC1, TSC2, VHL and XRCC2 (sequencing and deletion/duplication); EGFR, EGLN1, HOXB13, KIT, MITF, PDGFRA, POLD1, and POLE (sequencing only);  EPCAM and GREM1 (deletion/duplication only).   Based on Jaclyn Lopez's personal and family history of cancer, she meets medical criteria for genetic testing. Despite that she meets criteria, she may still have an out of pocket cost. We discussed that if her out of pocket cost for testing is over $100, the laboratory should contact them to discuss self-pay prices, patient pay assistance programs, if applicable, and other billing options.   PLAN: After considering the risks, benefits, and limitations, Jaclyn Lopez provided informed consent to pursue genetic testing and the blood sample was sent to [facility] for analysis of the [facility] and CancerNext-Expanded +RNAinsight Panel. Results should be available within approximately 1-2 weeks' time, at which point they will be disclosed by telephone to Jaclyn Lopez, as will any additional recommendations warranted by these results. Jaclyn Lopez will receive a summary of her genetic counseling visit and a copy of her results once available. This information will also be available in Epic.   Lastly, we encouraged Jaclyn Lopez to remain in contact with cancer genetics annually so that we can continuously update the family history and inform her of any changes in cancer genetics and testing that may be of benefit for this family.   Ms. Thomes Dinning questions were answered to her satisfaction today. Our contact information was provided should additional questions or concerns arise. Thank you for the referral and allowing [place] to share in the care of your patient.   Deeya Richeson M. Joette Catching, [place], [facility] Genetic Counselor [email or web address] (P) [phone]  The patient was seen for a total of 20 minutes in face-to-face genetic counseling.  The patient was accompanied by her friend. Drs. Magrinat, Lindi Adie and/or Burr Medico were available to discuss this case as needed.  _______________________________________________________________________ For Office Staff:  Number of people  involved in session: 2 Was an Intern/ student involved with case: no

## 2021-02-12 NOTE — Progress Notes (Signed)
[facility] Psychosocial Distress Screening Spiritual Care  Met with Jaclyn Lopez" by phone following Breast Multidisciplinary Clinic to introduce [place] team/resources, reviewing distress screen per protocol.  The patient scored a 4 on the Psychosocial Distress Thermometer which indicates moderate distress. Also assessed for distress and other psychosocial needs.   ONCBCN DISTRESS SCREENING 02/12/2021  Screening Type Initial Screening  Distress experienced in past week (1-10) 4  Emotional problem type Nervousness/Anxiety;Adjusting to illness  Information Concerns Type Lack of info about diagnosis;Lack of info about treatment  Physical Problem type Pain;Sleep/insomnia  Referral to support programs Yes   Jaclyn Lopez reports "very strong faith that God will take care of the whole thing" and finds comfort in encouraging Scripture and the support and prayer of friends.  She welcomes an [employer], particularly someone who had a bilateral mastectomy without reconstruction. Placing this referral per her request.   Follow up needed: No. Jaclyn Lopez has my direct dial number and plans to reach out as needed/desired. She is eager to look through the [facility] folder at all of the support programming.   [place], [place], [facility] Pager [phone] Voicemail [phone]

## 2021-02-16 ENCOUNTER — Telehealth: Payer: Self-pay | Admitting: Hematology and Oncology

## 2021-02-16 NOTE — Telephone Encounter (Signed)
Scheduled per sch msg. Called and left msg  

## 2021-02-18 ENCOUNTER — Telehealth: Payer: Self-pay | Admitting: *Deleted

## 2021-02-18 ENCOUNTER — Encounter (HOSPITAL_BASED_OUTPATIENT_CLINIC_OR_DEPARTMENT_OTHER): Payer: Self-pay | Admitting: General Surgery

## 2021-02-18 ENCOUNTER — Ambulatory Visit: Payer: Medicare PPO | Attending: General Surgery

## 2021-02-18 ENCOUNTER — Encounter: Payer: Self-pay | Admitting: *Deleted

## 2021-02-18 ENCOUNTER — Other Ambulatory Visit: Payer: Self-pay

## 2021-02-18 ENCOUNTER — Encounter: Payer: Self-pay | Admitting: Genetic Counselor

## 2021-02-18 ENCOUNTER — Telehealth: Payer: Self-pay | Admitting: Genetic Counselor

## 2021-02-18 DIAGNOSIS — M25511 Pain in right shoulder: Secondary | ICD-10-CM | POA: Diagnosis not present

## 2021-02-18 DIAGNOSIS — R293 Abnormal posture: Secondary | ICD-10-CM | POA: Diagnosis not present

## 2021-02-18 DIAGNOSIS — G8929 Other chronic pain: Secondary | ICD-10-CM | POA: Insufficient documentation

## 2021-02-18 DIAGNOSIS — Z1379 Encounter for other screening for genetic and chromosomal anomalies: Secondary | ICD-10-CM | POA: Insufficient documentation

## 2021-02-18 NOTE — Telephone Encounter (Signed)
Contacted patient in attempt to disclose results of genetic testing.  Patient unable to discuss results in detail.  Will return call later today or tomorrow.

## 2021-02-18 NOTE — Therapy (Signed)
[facility] @ [place], [place], [address] Phone:     Fax:     Physical Therapy Treatment  Patient Details  Name: Jaclyn Lopez MRN: [id number] Date of Birth: [birth year] Referring Provider (PT): Dr. Rolm Bookbinder   Encounter Date: 02/18/2021   PT End of Session - 02/18/21 0931     Visit Number 2    Date for PT Re-Evaluation 04/08/21    PT Start Time 1914    PT Stop Time 0928    PT Time Calculation (min) 41 min    Activity Tolerance Patient tolerated treatment well    Behavior During Therapy [facility] for tasks assessed/performed             Past Medical History:  Diagnosis Date   Atypical glandular cells on Pap smear 2003   Cancer [facility]) 2005   Breast-Ductal CIS Right breast-Radiation - no lymph nodes removed per pt   CKD (chronic kidney disease)    CVA (cerebral vascular accident) ([place])    Depression    Family history of breast cancer 02/12/2021   Family history of prostate cancer 02/12/2021   GERD (gastroesophageal reflux disease)    Herpes progenitalis    History of COVID-19 10/15/2020   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 Subsequent LEEP showed CIN-2 with clear margins and negative ECC   Osteopenia 11/2018   T score -1.5 FRAX 9% / 1% improved at both hips stable at spine   Personal history of breast cancer 02/12/2021   Stroke ([place]) 11/2019   Thyroid disease    Hyperthyroid    Past Surgical History:  Procedure Laterality Date   Bladder stretch and Bx  1990   BREAST LUMPECTOMY  2005   right; cancer   BREAST LUMPECTOMY  2006   left; pre cancer   BREAST SURGERY     Reduction   BUNIONECTOMY  2007, 2011    CATARACT EXTRACTION  06/17/2020   CERVICAL CONE BIOPSY  2003   CESAREAN SECTION  1983   DILATION AND CURETTAGE OF UTERUS  2009   ENDOMETRIAL ABLATION     Novasure   GANGLION CYST EXCISION Left 1985   HYSTEROSCOPY  2009   LEEP  05/2015   CIN-2 with clear margins   MASTECTOMY, PARTIAL     MOUTH SURGERY  2012   implants   TUBAL LIGATION  1984    There were no vitals filed for this visit.   Subjective Assessment - 02/18/21 0851     Subjective I am having a double mastectomy next Thursday.  I don't want to do dry needling.  I've had it before and I don't want to do it again.    Patient Stated Goals Decrease scapular pain and reduce lymphedema risk    Currently in Pain? Yes    Pain Score 2     Pain Location Scapula    Pain Orientation Right    Pain Descriptors / Indicators Aching;Stabbing    Pain Type Chronic pain    Pain Onset More than a month ago    Pain Frequency Intermittent    Aggravating Factors  reaching    Pain  Relieving Factors not sure                               [facility] Adult PT Treatment/Exercise - 02/18/21 0001       Exercises   Exercises Lumbar      Lumbar Exercises: Quadruped   Madcat/Old Horse 15 reps    Other Quadruped Lumbar Exercises childs pose and lateral childs pose 3x20 seconds each    Other Quadruped Lumbar Exercises thread the needle Rt and Lt x5 each      Manual Therapy   Manual Therapy Soft tissue mobilization;Myofascial release    Manual therapy comments elongation and release to Rt scapular muscularture                       PT Short Term Goals - 02/18/21 0854       PT SHORT TERM GOAL #1   Title Patient will be independent with a home exercise program.    Status On-going               PT Long Term Goals - 02/11/21 2045       PT LONG TERM GOAL #1   Title Patient will demonstrate she has regained full shoulder ROM and function post operatively compared to baselines.    Time 8    Period Weeks     Status New    Target Date 04/08/21                   Plan - 02/18/21 0931     Clinical Impression Statement Pt with 1 time visit prior to double mastectomy surgery for thoracic/scapular mobilization and tissue mobility.  Pt will perform HEP issued today until surgery.  Pt with improved tissue mobility after manual therapy and demonstrated reduced scapular mobility and thoracic mobility with exercises.  Pt will be placed on hold pending her upcoming surgery.    PT Treatment/Interventions ADLs/Self Care Home Management;Therapeutic exercise;Patient/family education;Passive range of motion;Manual techniques;Dry needling    PT Next Visit Plan hold chart    PT [place] and Agree with Plan of Care Patient;Family member/caregiver             Patient will benefit from skilled therapeutic intervention in order to improve the following deficits and impairments:  Postural dysfunction, Decreased range of motion, Decreased knowledge of precautions, Impaired UE functional use, Pain  Visit Diagnosis: Abnormal posture  Chronic right shoulder pain     Problem List Patient Active Problem List   Diagnosis Date Noted   Family history of breast cancer 02/12/2021   Personal history of breast cancer 02/12/2021   Family history of prostate cancer 02/12/2021   Malignant neoplasm of upper-outer quadrant of right breast in female, estrogen receptor positive ([place]) 02/04/2021   CVA (cerebral vascular accident) ([place]) 11/29/2019   Depression 11/29/2019   CKD (chronic kidney disease) stage 3, GFR 30-59 ml/min ([facility]) 11/29/2019   Nausea and vomiting 03/15/2019   Gastroesophageal reflux disease 03/15/2019   Loose stools 03/15/2019   Elevated alkaline phosphatase level 03/15/2019   Herpes progenitalis    Osteopenia    Hypothyroidism    IC (interstitial cystitis)    DUCTAL CARCINOMA IN SITU, RIGHT BREAST 03/12/2008   DIVERTICULOSIS OF COLON 03/12/2008     Sigurd Sos, PT 02/18/21 9:33 AM  [facility] @ [address]  [place], [place], [address] Phone:     Fax:     Name: Jaclyn Lopez MRN: [id number] Date of Birth: 1951-02-28

## 2021-02-18 NOTE — Progress Notes (Signed)
Reviewed neurology notes with Dr. Ambrose Pancoast okay to proceed with surgery as scheduled. Per Dr Donne Hazel pt can continue aspirin.

## 2021-02-18 NOTE — Patient Instructions (Signed)
Access Code: [email or web address] URL: [email or web address] Date: 02/18/2021 Prepared by: Claiborne Billings  Exercises Cat-Camel - 3 x daily - 7 x weekly - 1 sets - 10 reps - 5 hold Child's Pose Stretch - 3 x daily - 7 x weekly - 3 sets - 10 reps - 20 hold Child's Pose with Sidebending - 3 x daily - 7 x weekly - 3 sets - 3 reps - 20 hold Quadruped Full Range Thoracic Rotation with Reach - 1 x daily - 7 x weekly - 3 sets - 10 reps

## 2021-02-18 NOTE — Telephone Encounter (Signed)
Spoke with patient to follow from [facility] and assess navigation needs. Patient denies any questions or concerns at this time. Encouraged her to call should anything arise.

## 2021-02-18 NOTE — Telephone Encounter (Signed)
Revealed negative genetic testing.  Discussed that we do not know why she has breast cancer or why there is cancer in the family. It could be familial, due to a different gene that we are not testing, or maybe our current technology may not be able to pick something up.  It will be important for her to keep in contact with genetics to keep up with whether additional testing may be needed.  Results of pan-cancer panel are pending.

## 2021-02-19 DIAGNOSIS — C50911 Malignant neoplasm of unspecified site of right female breast: Secondary | ICD-10-CM | POA: Diagnosis not present

## 2021-02-19 DIAGNOSIS — Z9012 Acquired absence of left breast and nipple: Secondary | ICD-10-CM | POA: Diagnosis not present

## 2021-02-23 MED ORDER — ENSURE PRE-SURGERY PO LIQD: 296.0000 mL | Freq: Once | ORAL | Status: DC

## 2021-02-23 NOTE — Progress Notes (Signed)

## 2021-02-26 ENCOUNTER — Other Ambulatory Visit: Payer: Self-pay

## 2021-02-26 ENCOUNTER — Ambulatory Visit (HOSPITAL_BASED_OUTPATIENT_CLINIC_OR_DEPARTMENT_OTHER): Payer: Medicare PPO | Admitting: Anesthesiology

## 2021-02-26 ENCOUNTER — Encounter (HOSPITAL_BASED_OUTPATIENT_CLINIC_OR_DEPARTMENT_OTHER): Payer: Self-pay | Admitting: General Surgery

## 2021-02-26 ENCOUNTER — Observation Stay (HOSPITAL_BASED_OUTPATIENT_CLINIC_OR_DEPARTMENT_OTHER)
Admission: RE | Admit: 2021-02-26 | Discharge: 2021-02-27 | Disposition: A | Discharge status: Home / Self Care | Payer: Medicare PPO | Source: Ambulatory Visit | Attending: General Surgery | Admitting: General Surgery

## 2021-02-26 ENCOUNTER — Encounter (HOSPITAL_BASED_OUTPATIENT_CLINIC_OR_DEPARTMENT_OTHER)
Admission: RE | Disposition: A | Discharge status: Home / Self Care | Payer: Self-pay | Source: Ambulatory Visit | Attending: General Surgery

## 2021-02-26 DIAGNOSIS — Z170 Estrogen receptor positive status [ER+]: Secondary | ICD-10-CM | POA: Diagnosis not present

## 2021-02-26 DIAGNOSIS — N6021 Fibroadenosis of right breast: Secondary | ICD-10-CM | POA: Diagnosis not present

## 2021-02-26 DIAGNOSIS — G8918 Other acute postprocedural pain: Secondary | ICD-10-CM | POA: Diagnosis not present

## 2021-02-26 DIAGNOSIS — Z7982 Long term (current) use of aspirin: Secondary | ICD-10-CM | POA: Diagnosis not present

## 2021-02-26 DIAGNOSIS — Z87891 Personal history of nicotine dependence: Secondary | ICD-10-CM | POA: Insufficient documentation

## 2021-02-26 DIAGNOSIS — N183 Chronic kidney disease, stage 3 unspecified: Secondary | ICD-10-CM | POA: Insufficient documentation

## 2021-02-26 DIAGNOSIS — R921 Mammographic calcification found on diagnostic imaging of breast: Secondary | ICD-10-CM | POA: Diagnosis not present

## 2021-02-26 DIAGNOSIS — C50411 Malignant neoplasm of upper-outer quadrant of right female breast: Principal | ICD-10-CM | POA: Insufficient documentation

## 2021-02-26 DIAGNOSIS — D4862 Neoplasm of uncertain behavior of left breast: Secondary | ICD-10-CM | POA: Diagnosis not present

## 2021-02-26 DIAGNOSIS — N6022 Fibroadenosis of left breast: Secondary | ICD-10-CM | POA: Insufficient documentation

## 2021-02-26 DIAGNOSIS — E039 Hypothyroidism, unspecified: Secondary | ICD-10-CM | POA: Diagnosis not present

## 2021-02-26 DIAGNOSIS — Z9013 Acquired absence of bilateral breasts and nipples: Secondary | ICD-10-CM

## 2021-02-26 DIAGNOSIS — C50911 Malignant neoplasm of unspecified site of right female breast: Secondary | ICD-10-CM | POA: Diagnosis not present

## 2021-02-26 DIAGNOSIS — N6092 Unspecified benign mammary dysplasia of left breast: Secondary | ICD-10-CM | POA: Diagnosis not present

## 2021-02-26 HISTORY — DX: Patent foramen ovale: Q21.12

## 2021-02-26 HISTORY — PX: TOTAL MASTECTOMY: SHX6129

## 2021-02-26 HISTORY — DX: Other specified postprocedural states: Z98.890

## 2021-02-26 HISTORY — DX: Other specified postprocedural states: R11.2

## 2021-02-26 HISTORY — PX: MASTECTOMY W/ SENTINEL NODE BIOPSY: SHX2001

## 2021-02-26 SURGERY — MASTECTOMY WITH SENTINEL LYMPH NODE BIOPSY
Anesthesia: Regional | Site: Breast | Laterality: Right

## 2021-02-26 MED ORDER — ZOLPIDEM TARTRATE 5 MG PO TABS
5.0000 mg | ORAL_TABLET | Freq: Every evening | ORAL | Status: DC | PRN
  Administered 2021-02-26: 5 mg via ORAL
  Filled 2021-02-26: qty 1

## 2021-02-26 MED ORDER — FENTANYL CITRATE (PF) 100 MCG/2ML IJ SOLN
INTRAMUSCULAR | Status: AC
  Filled 2021-02-26: qty 2

## 2021-02-26 MED ORDER — AMISULPRIDE (ANTIEMETIC) 5 MG/2ML IV SOLN
INTRAVENOUS | Status: AC
  Filled 2021-02-26: qty 2

## 2021-02-26 MED ORDER — ACETAMINOPHEN 500 MG PO TABS
1000.0000 mg | ORAL_TABLET | Freq: Four times a day (QID) | ORAL | Status: DC
  Administered 2021-02-26: 1000 mg via ORAL
  Filled 2021-02-26: qty 2

## 2021-02-26 MED ORDER — SUGAMMADEX SODIUM 200 MG/2ML IV SOLN
INTRAVENOUS | Status: DC | PRN
  Administered 2021-02-26: 200 mg via INTRAVENOUS

## 2021-02-26 MED ORDER — PROPOFOL 10 MG/ML IV BOLUS
INTRAVENOUS | Status: AC
  Filled 2021-02-26: qty 20

## 2021-02-26 MED ORDER — ACETAMINOPHEN 500 MG PO TABS
1000.0000 mg | ORAL_TABLET | ORAL | Status: AC
  Administered 2021-02-26: 1000 mg via ORAL

## 2021-02-26 MED ORDER — ONDANSETRON HCL 4 MG/2ML IJ SOLN: 4.0000 mg | Freq: Four times a day (QID) | INTRAMUSCULAR | Status: DC | PRN

## 2021-02-26 MED ORDER — PHENYLEPHRINE HCL (PRESSORS) 10 MG/ML IV SOLN
INTRAVENOUS | Status: DC | PRN
  Administered 2021-02-26: 120 ug via INTRAVENOUS
  Administered 2021-02-26 (×4): 80 ug via INTRAVENOUS

## 2021-02-26 MED ORDER — FENTANYL CITRATE (PF) 100 MCG/2ML IJ SOLN
INTRAMUSCULAR | Status: DC | PRN
  Administered 2021-02-26: 50 ug via INTRAVENOUS
  Administered 2021-02-26 (×2): 25 ug via INTRAVENOUS

## 2021-02-26 MED ORDER — KETOROLAC TROMETHAMINE 15 MG/ML IJ SOLN: 15.0000 mg | Freq: Once | INTRAMUSCULAR | Status: DC | PRN

## 2021-02-26 MED ORDER — LIDOCAINE 2% (20 MG/ML) 5 ML SYRINGE
INTRAMUSCULAR | Status: AC
  Filled 2021-02-26: qty 5

## 2021-02-26 MED ORDER — ESMOLOL HCL 100 MG/10ML IV SOLN
INTRAVENOUS | Status: AC
  Filled 2021-02-26: qty 10

## 2021-02-26 MED ORDER — ESMOLOL HCL 100 MG/10ML IV SOLN
INTRAVENOUS | Status: DC | PRN
  Administered 2021-02-26: 20 mg via INTRAVENOUS

## 2021-02-26 MED ORDER — SIMETHICONE 80 MG PO CHEW: 40.0000 mg | CHEWABLE_TABLET | Freq: Four times a day (QID) | ORAL | Status: DC | PRN

## 2021-02-26 MED ORDER — TRAMADOL HCL 50 MG PO TABS
100.0000 mg | ORAL_TABLET | Freq: Four times a day (QID) | ORAL | Status: DC | PRN
Start: 2021-02-26 — End: 2021-02-27
  Administered 2021-02-26 – 2021-02-27 (×3): 100 mg via ORAL
  Filled 2021-02-26: qty 2

## 2021-02-26 MED ORDER — SODIUM CHLORIDE (PF) 0.9 % IJ SOLN
INTRAMUSCULAR | Status: AC
  Filled 2021-02-26: qty 10

## 2021-02-26 MED ORDER — MIRTAZAPINE 7.5 MG PO TABS: 3.7500 mg | ORAL_TABLET | Freq: Every day | ORAL | Status: DC

## 2021-02-26 MED ORDER — DEXAMETHASONE SODIUM PHOSPHATE 10 MG/ML IJ SOLN
INTRAMUSCULAR | Status: AC
  Filled 2021-02-26: qty 1

## 2021-02-26 MED ORDER — MIDAZOLAM HCL 2 MG/2ML IJ SOLN
2.0000 mg | Freq: Once | INTRAMUSCULAR | Status: AC
  Administered 2021-02-26: 2 mg via INTRAVENOUS

## 2021-02-26 MED ORDER — METHYLENE BLUE 0.5 % INJ SOLN
INTRAVENOUS | Status: AC
  Filled 2021-02-26: qty 10

## 2021-02-26 MED ORDER — ACETAMINOPHEN 500 MG PO TABS
ORAL_TABLET | ORAL | Status: AC
  Filled 2021-02-26: qty 2

## 2021-02-26 MED ORDER — AMISULPRIDE (ANTIEMETIC) 5 MG/2ML IV SOLN
INTRAVENOUS | Status: DC | PRN
  Administered 2021-02-26: 5 mg via INTRAVENOUS

## 2021-02-26 MED ORDER — SODIUM CHLORIDE 0.9 % IV SOLN: INTRAVENOUS | Status: DC

## 2021-02-26 MED ORDER — LEVOTHYROXINE SODIUM 50 MCG PO TABS: 50.0000 ug | ORAL_TABLET | Freq: Every day | ORAL | Status: DC

## 2021-02-26 MED ORDER — SODIUM CHLORIDE (PF) 0.9 % IJ SOLN
INTRAVENOUS | Status: DC | PRN
  Administered 2021-02-26: 4 mL via INTRAMUSCULAR

## 2021-02-26 MED ORDER — ROCURONIUM BROMIDE 100 MG/10ML IV SOLN
INTRAVENOUS | Status: DC | PRN
  Administered 2021-02-26: 40 mg via INTRAVENOUS
  Administered 2021-02-26: 20 mg via INTRAVENOUS

## 2021-02-26 MED ORDER — METHOCARBAMOL 500 MG PO TABS
500.0000 mg | ORAL_TABLET | Freq: Four times a day (QID) | ORAL | Status: DC | PRN
  Administered 2021-02-26 – 2021-02-27 (×3): 500 mg via ORAL
  Filled 2021-02-26 (×3): qty 1

## 2021-02-26 MED ORDER — MIDAZOLAM HCL 2 MG/2ML IJ SOLN
INTRAMUSCULAR | Status: AC
  Filled 2021-02-26: qty 2

## 2021-02-26 MED ORDER — LACTATED RINGERS IV SOLN: INTRAVENOUS | Status: DC

## 2021-02-26 MED ORDER — ONDANSETRON HCL 4 MG/2ML IJ SOLN
INTRAMUSCULAR | Status: AC
  Filled 2021-02-26: qty 2

## 2021-02-26 MED ORDER — CHLORHEXIDINE GLUCONATE CLOTH 2 % EX PADS
6.0000 | MEDICATED_PAD | Freq: Once | CUTANEOUS | Status: AC
  Administered 2021-02-26: 6 via TOPICAL

## 2021-02-26 MED ORDER — FENTANYL CITRATE (PF) 100 MCG/2ML IJ SOLN: 25.0000 ug | INTRAMUSCULAR | Status: DC | PRN

## 2021-02-26 MED ORDER — LIDOCAINE HCL (CARDIAC) PF 100 MG/5ML IV SOSY
PREFILLED_SYRINGE | INTRAVENOUS | Status: DC | PRN
  Administered 2021-02-26: 30 mg via INTRAVENOUS

## 2021-02-26 MED ORDER — MORPHINE SULFATE (PF) 4 MG/ML IV SOLN
1.0000 mg | INTRAVENOUS | Status: DC | PRN
Start: 2021-02-26 — End: 2021-02-27
  Administered 2021-02-26 (×2): 1 mg via INTRAVENOUS
  Filled 2021-02-26: qty 1

## 2021-02-26 MED ORDER — FLUOXETINE HCL 20 MG PO CAPS: 20.0000 mg | ORAL_CAPSULE | Freq: Every day | ORAL | Status: DC

## 2021-02-26 MED ORDER — PROPOFOL 10 MG/ML IV BOLUS
INTRAVENOUS | Status: DC | PRN
  Administered 2021-02-26: 100 mg via INTRAVENOUS

## 2021-02-26 MED ORDER — ONDANSETRON HCL 4 MG/2ML IJ SOLN: 4.0000 mg | Freq: Once | INTRAMUSCULAR | Status: DC | PRN

## 2021-02-26 MED ORDER — ONDANSETRON HCL 4 MG/2ML IJ SOLN
INTRAMUSCULAR | Status: DC | PRN
  Administered 2021-02-26: 4 mg via INTRAVENOUS

## 2021-02-26 MED ORDER — CHLORHEXIDINE GLUCONATE CLOTH 2 % EX PADS: 6.0000 | MEDICATED_PAD | Freq: Once | CUTANEOUS | Status: DC

## 2021-02-26 MED ORDER — CEFAZOLIN SODIUM-DEXTROSE 2-4 GM/100ML-% IV SOLN
INTRAVENOUS | Status: AC
  Filled 2021-02-26: qty 100

## 2021-02-26 MED ORDER — MAGTRACE LYMPHATIC TRACER
INTRAMUSCULAR | Status: DC | PRN
  Administered 2021-02-26: 2 mL via INTRAMUSCULAR

## 2021-02-26 MED ORDER — BUPROPION HCL ER (XL) 150 MG PO TB24: 150.0000 mg | ORAL_TABLET | Freq: Every morning | ORAL | Status: DC

## 2021-02-26 MED ORDER — PROPOFOL 500 MG/50ML IV EMUL
INTRAVENOUS | Status: DC | PRN
  Administered 2021-02-26: 25 ug/kg/min via INTRAVENOUS

## 2021-02-26 MED ORDER — CEFAZOLIN SODIUM-DEXTROSE 2-4 GM/100ML-% IV SOLN
2.0000 g | INTRAVENOUS | Status: AC
  Administered 2021-02-26: 2 g via INTRAVENOUS

## 2021-02-26 MED ORDER — BUPIVACAINE HCL (PF) 0.25 % IJ SOLN
INTRAMUSCULAR | Status: DC | PRN
  Administered 2021-02-26 (×2): 30 mL via PERINEURAL

## 2021-02-26 MED ORDER — TRAMADOL HCL 50 MG PO TABS: 100.0000 mg | ORAL_TABLET | Freq: Four times a day (QID) | ORAL | 0 refills | Status: DC | PRN

## 2021-02-26 MED ORDER — TRAMADOL HCL 50 MG PO TABS
ORAL_TABLET | ORAL | Status: AC
  Filled 2021-02-26: qty 2

## 2021-02-26 MED ORDER — FENTANYL CITRATE (PF) 100 MCG/2ML IJ SOLN
100.0000 ug | Freq: Once | INTRAMUSCULAR | Status: AC
  Administered 2021-02-26: 100 ug via INTRAVENOUS

## 2021-02-26 MED ORDER — PANTOPRAZOLE SODIUM 40 MG PO TBEC: 40.0000 mg | DELAYED_RELEASE_TABLET | Freq: Every day | ORAL | Status: DC

## 2021-02-26 MED ORDER — DEXAMETHASONE SODIUM PHOSPHATE 4 MG/ML IJ SOLN
INTRAMUSCULAR | Status: DC | PRN
  Administered 2021-02-26: 10 mg via INTRAVENOUS

## 2021-02-26 MED ORDER — ONDANSETRON 4 MG PO TBDP: 4.0000 mg | ORAL_TABLET | Freq: Four times a day (QID) | ORAL | Status: DC | PRN

## 2021-02-26 SURGICAL SUPPLY — 58 items
ADH SKN CLS APL DERMABOND .7 (GAUZE/BANDAGES/DRESSINGS) ×8
APL PRP STRL LF DISP 70% ISPRP (MISCELLANEOUS) ×4
APPLIER CLIP 9.375 MED OPEN (MISCELLANEOUS) ×6
APR CLP MED 9.3 20 MLT OPN (MISCELLANEOUS) ×4
BIOPATCH RED 1 DISK 7.0 (GAUZE/BANDAGES/DRESSINGS) ×2 IMPLANT
BLADE SURG 10 STRL SS (BLADE) ×3 IMPLANT
BLADE SURG 15 STRL LF DISP TIS (BLADE) ×2 IMPLANT
BLADE SURG 15 STRL SS (BLADE) ×3
CANISTER SUCT 1200ML W/VALVE (MISCELLANEOUS) ×3 IMPLANT
CHLORAPREP W/TINT 26 (MISCELLANEOUS) ×4 IMPLANT
CLIP APPLIE 9.375 MED OPEN (MISCELLANEOUS) IMPLANT
COVER BACK TABLE 60X90IN (DRAPES) ×3 IMPLANT
COVER MAYO STAND STRL (DRAPES) ×3 IMPLANT
COVER PROBE W GEL 5X96 (DRAPES) ×3 IMPLANT
DERMABOND ADVANCED (GAUZE/BANDAGES/DRESSINGS) ×4
DERMABOND ADVANCED .7 DNX12 (GAUZE/BANDAGES/DRESSINGS) ×2 IMPLANT
DRAIN CHANNEL 19F RND (DRAIN) ×4 IMPLANT
DRAPE TOP ARMCOVERS (MISCELLANEOUS) ×3 IMPLANT
DRAPE U-SHAPE 76X120 STRL (DRAPES) ×3 IMPLANT
DRAPE UTILITY XL STRL (DRAPES) ×3 IMPLANT
DRSG PAD ABDOMINAL 8X10 ST (GAUZE/BANDAGES/DRESSINGS) ×5 IMPLANT
DRSG TEGADERM 4X4.75 (GAUZE/BANDAGES/DRESSINGS) ×2 IMPLANT
ELECT REM PT RETURN 9FT ADLT (ELECTROSURGICAL) ×3
ELECTRODE REM PT RTRN 9FT ADLT (ELECTROSURGICAL) ×2 IMPLANT
EVACUATOR SILICONE 100CC (DRAIN) ×4 IMPLANT
GLOVE SURG ENC MOIS LTX SZ7 (GLOVE) ×4 IMPLANT
GLOVE SURG UNDER POLY LF SZ7.5 (GLOVE) ×3 IMPLANT
GOWN STRL REUS W/ TWL LRG LVL3 (GOWN DISPOSABLE) ×6 IMPLANT
GOWN STRL REUS W/TWL LRG LVL3 (GOWN DISPOSABLE) ×6
HEMOSTAT ARISTA ABSORB 3G PWDR (HEMOSTASIS) ×3 IMPLANT
NDL HYPO 25X1 1.5 SAFETY (NEEDLE) IMPLANT
NDL SAFETY ECLIPSE 18X1.5 (NEEDLE) IMPLANT
NEEDLE HYPO 18GX1.5 SHARP (NEEDLE) ×6
NEEDLE HYPO 25X1 1.5 SAFETY (NEEDLE) ×6 IMPLANT
NS IRRIG 1000ML POUR BTL (IV SOLUTION) ×3 IMPLANT
PACK BASIN DAY SURGERY FS (CUSTOM PROCEDURE TRAY) ×3 IMPLANT
PENCIL SMOKE EVACUATOR (MISCELLANEOUS) ×3 IMPLANT
PIN SAFETY STERILE (MISCELLANEOUS) ×3 IMPLANT
SLEEVE SCD COMPRESS KNEE MED (STOCKING) ×3 IMPLANT
SPONGE T-LAP 18X18 ~~LOC~~+RFID (SPONGE) ×6 IMPLANT
STAPLER VISISTAT 35W (STAPLE) ×1 IMPLANT
STRIP CLOSURE SKIN 1/2X4 (GAUZE/BANDAGES/DRESSINGS) ×4 IMPLANT
SUT ETHILON 2 0 FS 18 (SUTURE) ×4 IMPLANT
SUT ETHILON 3 0 PS 1 (SUTURE) ×2 IMPLANT
SUT MNCRL AB 4-0 PS2 18 (SUTURE) ×4 IMPLANT
SUT SILK 2 0 SH (SUTURE) ×1 IMPLANT
SUT VIC AB 2-0 SH 27 (SUTURE)
SUT VIC AB 2-0 SH 27XBRD (SUTURE) ×4 IMPLANT
SUT VIC AB 3-0 54X BRD REEL (SUTURE) IMPLANT
SUT VIC AB 3-0 BRD 54 (SUTURE) ×3
SUT VIC AB 3-0 SH 27 (SUTURE)
SUT VIC AB 3-0 SH 27X BRD (SUTURE) IMPLANT
SUT VICRYL 3-0 CR8 SH (SUTURE) ×6 IMPLANT
SYR CONTROL 10ML LL (SYRINGE) ×2 IMPLANT
TOWEL GREEN STERILE FF (TOWEL DISPOSABLE) ×6 IMPLANT
TRACER MAGTRACE VIAL (MISCELLANEOUS) ×1 IMPLANT
TUBE CONNECTING 20X1/4 (TUBING) ×3 IMPLANT
YANKAUER SUCT BULB TIP NO VENT (SUCTIONS) ×3 IMPLANT

## 2021-02-26 NOTE — Anesthesia Preprocedure Evaluation (Addendum)
Anesthesia Evaluation  Patient identified by MRN, date of birth, ID band Patient awake    Reviewed: Allergy & Precautions, NPO status , Patient's Chart, lab work & pertinent test results  History of Anesthesia Complications (+) PONV and history of anesthetic complications  Airway Mallampati: II  TM Distance: >3 FB Neck ROM: Full    Dental no notable dental hx.    Pulmonary former smoker,    Pulmonary exam normal breath sounds clear to auscultation       Cardiovascular negative cardio ROS Normal cardiovascular exam Rhythm:Regular Rate:Normal     Neuro/Psych PSYCHIATRIC DISORDERS Depression CVA (Left sided weakness)    GI/Hepatic Neg liver ROS, GERD  Controlled,  Endo/Other  Hypothyroidism   Renal/GU Renal disease     Musculoskeletal negative musculoskeletal ROS (+)   Abdominal   Peds  Hematology negative hematology ROS (+) HLD   Anesthesia Other Findings BREAST CANCER  Reproductive/Obstetrics                            Anesthesia Physical Anesthesia Plan  ASA: 2  Anesthesia Plan: General and Regional   Post-op Pain Management: GA combined w/ Regional for post-op pain   Induction: Intravenous  PONV Risk Score and Plan: Ondansetron, Dexamethasone, Propofol infusion, Midazolam and Treatment may vary due to age or medical condition  Airway Management Planned: LMA  Additional Equipment:   Intra-op Plan:   Post-operative Plan: Extubation in OR  Informed Consent: I have reviewed the patients History and Physical, chart, labs and discussed the procedure including the risks, benefits and alternatives for the proposed anesthesia with the patient or authorized representative who has indicated his/her understanding and acceptance.     Dental advisory given  Plan Discussed with: CRNA  Anesthesia Plan Comments:        Anesthesia Quick Evaluation

## 2021-02-26 NOTE — H&P (Signed)
70 y.o. female who is seen today as an office consultation at the request of Dr. Lindi Adie for evaluation of Breast Cancer She is well-known to me. 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 She states she had no other treatment at that time. 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 She also has chronic kidney disease. She is followed by GI for increased LFTs. She underwent a screening mammogram that was found to have a mass and calcifications in the right breast. There is a 1 cm posterior upper outer quadrant mass with calcs extending 1.3 cm. There is no ultrasound correlate to this. She did not have an axillary assessment. 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 This is ER positive at 90%, PR positive at 70%, HER2 negative, and the proliferation index is 20%. She has no mass. She has no discharge. She is here to discuss options today. She also tells me in 2005 she underwent a reduction on the left side that was found to have some what she terms high risk lesion present. She then was followed by lumpectomy by one of my retired partners. There has been no cancer on the left side. I do not have this pathology available to me at this time.  Review of Systems: A complete review of systems was obtained from the patient. I have reviewed this information and discussed as appropriate with the patient. See HPI as well for other ROS.  Review of Systems  All other systems reviewed and are negative.   Medical History: Past Medical History:  Diagnosis Date   Chronic kidney disease   History of cancer   History of stroke   Hyperlipidemia   Thyroid disease   Patient Active Problem List  Diagnosis   Carcinoma in situ of breast   Malignant neoplasm of upper-outer quadrant of right breast in female, estrogen receptor positive  (CMS-HCC)   Thyroid nodule   Stage 3 chronic kidney disease (CMS-HCC)   Past Surgical History:  Procedure Laterality Date   Breast Lumpectomy Right  2005   CATARACT EXTRACTION N/A 06/17/2020   Dilation and Curettage of uterus N/A  2009   MASTECTOMY PARTIAL / LUMPECTOMY Left  "high risk lesion" I dont have path yet   Mouth Surgery N/A  2012    Allergies  Allergen Reactions   Amantadine Hcl Hives   Oseltamivir Hives and Other (See Comments)   Current Outpatient Medications on File Prior to Visit  Medication Sig Dispense Refill   aspirin 81 MG EC tablet Take by mouth   buPROPion (WELLBUTRIN XL) 150 MG XL tablet Take 1 tablet by mouth every morning   zaleplon (SONATA) 10 MG capsule Take by mouth   atorvastatin (LIPITOR) 40 MG tablet 1 tablet   calcium carbonate (TUMS) 200 mg calcium (500 mg) chewable tablet Take by mouth   cholecalciferol (VITAMIN D3) 1000 unit tablet Take by mouth   dext 70/polycarbophil/peg/NaCl (ARTIFICIAL TEAR SOLUTION OPHTH) Apply to eye   docosahexaenoic acid-epa 120-180 mg Cap Take by mouth   FLUoxetine (PROZAC) 20 MG capsule Take 1 capsule by mouth once daily   levothyroxine (SYNTHROID) 50 MCG tablet TAKE 1 TABLET BY MOUTH ON AN EMPTY STOMACH IN THE MORNING   mirtazapine (REMERON) 15 MG tablet TAKE 1/4 TABLET BY MOUTH BEFORE BEDTIME EVERY DAY 90   omeprazole (PRILOSEC)  40 MG DR capsule Take by mouth   No current facility-administered medications on file prior to visit.   Family History  Problem Relation Age of Onset   Bipolar disorder Mother   COPD Mother   Dementia Mother   Lung cancer Father   Breast cancer Maternal Grandmother    Social History   Tobacco Use  Smoking Status Former Smoker   Types: Cigarettes  Smokeless Tobacco Never Used  Tobacco Comment  Quit smoking 05/17/96    Social History   Socioeconomic History   Marital status: Unknown  Tobacco Use   Smoking status: Former Smoker  Types: Cigarettes   Smokeless tobacco:  Never Used   Tobacco comment: Quit smoking 05/17/96  Vaping Use   Vaping Use: Never used  Substance and Sexual Activity   Alcohol use: Yes  Alcohol/week: 1.0 standard drink  Types: 1 Glasses of wine per week  Comment: 1 glass of wine a week   Drug use: Never   Objective:  Physical Exam Constitutional:  Appearance: Normal appearance.  Chest:  Breasts:  Right: No mass, nipple discharge or skin change.  Left: No mass, nipple discharge or skin change.  Lymphadenopathy:  Upper Body:  Right upper body: No supraclavicular or axillary adenopathy.  Left upper body: No supraclavicular or axillary adenopathy.  Neurological:  Mental Status: She is alert.    Assessment and Plan:   Malignant neoplasm of upper-outer quadrant of right breast in female, estrogen receptor positive (CMS-HCC)  Right mastectomy, left mastectomy, right ax sn biopsy  We discussed the staging and pathophysiology of breast cancer. We discussed all of the different options for treatment for breast cancer including surgery, chemotherapy, radiation therapy, Herceptin, and antiestrogen therapy. We discussed a sentinel lymph node biopsy as she does not appear to having lymph node involvement right now. We discussed the performance of that with injection of radioactive tracer. We discussed that there is a chance of having a positive node with a sentinel lymph node biopsy and we will await the permanent pathology to make any other first further decisions in terms of her treatment. We discussed up to a 5% risk lifetime of chronic shoulder pain as well as lymphedema associated with a sentinel lymph node biopsy. We discussed the options for treatment of the breast cancer which included lumpectomy versus a mastectomy. We discussed the performance of the lumpectomy with radioactive seed placement. We discussed a 5-10% chance of a positive margin requiring reexcision in the operating room. We also discussed that she will likely need  radiation therapy if she undergoes lumpectomy. We discussed mastectomy and the postoperative care for that as well. Mastectomy can be followed by reconstruction. The decision for lumpectomy vs mastectomy has no impact on decision for chemotherapy. Most mastectomy patients will not need radiation therapy. We discussed that there is no difference in her survival whether she undergoes lumpectomy with radiation therapy or antiestrogen therapy versus a mastectomy. There is also no real difference between her recurrence in the breast. I think we could consider a lumpectomy on the right but there certainly would be a high risk of local recurrence. She very much would like to proceed with a mastectomy. I think this is certainly reasonable given her prior therapy. She would also like to do a risk reducing mastectomy on the left side both for symmetry as well as the fact that she has a second cancer and has had a high risk lesion on that side. We discussed that would not impact her survival  but may reduce her chance of having a future cancer. She is also getting genetic testing done. We discussed reconstruction in detail and she would not like to pursue that. We discussed the risks of operation including bleeding, infection, possible reoperation. She understands her further therapy will be based on what her stages at the time of her operation.

## 2021-02-26 NOTE — Transfer of Care (Signed)
Immediate Anesthesia Transfer of Care Note  Patient: Jaclyn Lopez  Procedure(s) Performed: RIGHT MASTECTOMY WITH AXILLARY SENTINEL LYMPH NODE BIOPSY (Right: Breast) LEFT TOTAL MASTECTOMY (Left: Breast)  Patient Location: PACU  Anesthesia Type:General and Regional  Level of Consciousness: awake and alert   Airway & Oxygen Therapy: Patient Spontanous Breathing and Patient connected to face mask oxygen  Post-op Assessment: Report given to RN and Post -op Vital signs reviewed and stable  Post vital signs: Reviewed and stable  Last Vitals:  Vitals Value Taken Time  BP 130/67 02/26/21 1329  Temp    Pulse 98 02/26/21 1329  Resp 15 02/26/21 1329  SpO2 99 % 02/26/21 1329  Vitals shown include unvalidated device data.  Last Pain:  Vitals:   02/26/21 0941  TempSrc: Oral  PainSc: 0-No pain      Patients Stated Pain Goal: 3 (69/43/70 0525)  Complications: No notable events documented.

## 2021-02-26 NOTE — Anesthesia Postprocedure Evaluation (Signed)
Anesthesia Post Note  Patient: Jaclyn Lopez  Procedure(s) Performed: RIGHT MASTECTOMY WITH AXILLARY SENTINEL LYMPH NODE BIOPSY (Right: Breast) LEFT TOTAL MASTECTOMY (Left: Breast)     Patient location during evaluation: PACU Anesthesia Type: Regional and General Level of consciousness: awake Pain management: pain level controlled Vital Signs Assessment: post-procedure vital signs reviewed and stable Respiratory status: spontaneous breathing, nonlabored ventilation, respiratory function stable and patient connected to nasal cannula oxygen Cardiovascular status: blood pressure returned to baseline and stable Postop Assessment: no apparent nausea or vomiting Anesthetic complications: no   No notable events documented.  Last Vitals:  Vitals:   02/26/21 1445 02/26/21 1800  BP: (!) 105/45 (!) 112/55  Pulse: 84 88  Resp:    Temp: 36.7 C 36.6 C  SpO2: 97% 97%    Last Pain:  Vitals:   02/26/21 1800  TempSrc:   PainSc: 2                  Azizi Bally P Alexandre Faries

## 2021-02-26 NOTE — Anesthesia Procedure Notes (Signed)
Anesthesia Regional Block: Pectoralis block   Pre-Anesthetic Checklist: , timeout performed,  Correct Patient, Correct Site, Correct Laterality,  Correct Procedure, Correct Position, site marked,  Risks and benefits discussed,  Surgical consent,  Pre-op evaluation,  At surgeon's request and post-op pain management  Laterality: Right  Prep: chloraprep       Needles:  Injection technique: Single-shot  Needle Type: Echogenic Stimulator Needle     Needle Length: 9cm  Needle Gauge: 21     Additional Needles:   Procedures:,,,, ultrasound used (permanent image in chart),,    Narrative:  Start time: 02/26/2021 10:20 AM End time: 02/26/2021 10:30 AM Injection made incrementally with aspirations every 5 mL.  Performed by: Personally  Anesthesiologist: Murvin Natal, MD  Additional Notes: Functioning IV was confirmed and monitors were applied.  A timeout was performed. Sterile prep, hand hygiene and sterile gloves were used. A 72mm 21ga Arrow echogenic stimulator needle was used. Negative aspiration and negative test dose prior to incremental administration of local anesthetic. The patient tolerated the procedure well.  Ultrasound guidance: relevent anatomy identified, needle position confirmed, local anesthetic spread visualized around nerve(s), vascular puncture avoided.  Image printed for medical record.

## 2021-02-26 NOTE — Progress Notes (Signed)
Assisted Dr. Roanna Banning with right, left, ultrasound guided, pectoralis block. Side rails up, monitors on throughout procedure. See vital signs in flow sheet. Tolerated Procedure well.

## 2021-02-26 NOTE — Op Note (Signed)
Preoperative diagnosis: Clinical stage I right breast cancer status post prior breast conservation therapy and radiotherapy Postoperative diagnosis: Same as above Procedure: 1.  Left risk reducing mastectomy 2.  Right mastectomy 3.  Injection of mag trace and blue dye for sentinel lymph node identification 4.  Right deep axillary sentinel lymph node biopsy Surgeon: Dr. Serita Grammes Anesthesia: General with bilateral pectoral blocks Estimated blood loss: 30 cc Specimens: 1.  Left breast marked short superior, long lateral 2.  Right breast marked short superior, long lateral 3.  Right deep axillary sentinel lymph nodes 4.  Additional right breast tissue and skin Complications: None Drains: 53 French Blake drain to either side Special count was correct at completion Disposition recovery stable condition  Indications: This is a 70 year old female with a history of 2005 of DCIS treated with a lumpectomy followed by radiotherapy.  She underwent a screening mammogram and was found to have a right breast mass with calcifications.  She had a biopsy and this was an invasive ductal carcinoma that is grade 2, ER positive, PR positive, HER2 negative.  We discussed all of her options and she elected undergo mastectomy with sentinel node biopsy.  Due to the high risk lesion she has had on the other side previously we also discussed a mastectomy on the other side.  She declined reconstruction.  Procedure: After informed consent was obtained the patient first underwent bilateral pectoral blocks she was given antibiotics.  SCDs were in place.  She was then placed under general anesthesia without complication.  She was prepped and draped in the standard sterile surgical fashion.  Surgical timeout was then performed.  I injected 2 cc of mag trace in the subareolar position and 4 cc of blue dye in the right upper outer quadrant for later sentinel lymph node identification  I did the left side first.  I made a  reduction pattern incision in an inverted T and removed a fair amount of the skin.  I created flaps to the clavicle, parasternal, latissimus, and inframammary fold inferiorly.  I remove the breast and the fascia from the pectoralis muscle and passed this off the table and marked it as above.  Hemostasis was obtained.  I then closed this with 3-0 Vicryl.  I closed the lateral portion down to the chest wall.  I placed a 27 [place] Blake drain and secured this with a 2-0 nylon suture.  I then closed the skin with 4-0 Monocryl.  Glue and Steri-Strips were eventually applied.  The drain was functional upon completion.  I then did the right side in a similar fashion.  Right side was much more difficult due to the fact that she had been radiated on that side.  There was a lot of scar tissue in the upper outer quadrant as well.  I then made the incision and created flaps to the clavicle, sternum, latissimus, line in the inframammary fold.  I remove the breast and the fascia from the muscle.  Laterally there was a lot of scar tissue from her prior lumpectomy as well as the radiotherapy.  This was marked as above and passed off the table.  I then was able to identify what appeared to be a couple of small normal-appearing nodes that had a very minor amount of activity in them.  There was no other activity and there were no palpable nodes.  I then passed these off the table.  Hemostasis was obtained.  I placed a 64 [place] Blake drain and secured  this with a 2-0 nylon.  I closed the lateral portion of the incision down to the chest wall with Vicryl suture.  I then closed this with 3-0 Vicryl, 4 Monocryl, and glue.  She tolerated this well was extubated transferred to recovery stable.

## 2021-02-26 NOTE — Anesthesia Procedure Notes (Signed)
Procedure Name: Intubation Date/Time: 02/26/2021 10:58 AM Performed by: Verita Lamb, CRNA Pre-anesthesia Checklist: Patient identified, Emergency Drugs available, Suction available and Patient being monitored Patient Re-evaluated:Patient Re-evaluated prior to induction Oxygen Delivery Method: Circle system utilized Preoxygenation: Pre-oxygenation with 100% oxygen Induction Type: IV induction Ventilation: Mask ventilation without difficulty Laryngoscope Size: Mac and 3 Grade View: Grade I Tube type: Oral Tube size: 7.0 mm Number of attempts: 1 Airway Equipment and Method: Stylet and Oral airway Placement Confirmation: ETT inserted through vocal cords under direct vision, positive ETCO2 and breath sounds checked- equal and bilateral Tube secured with: Tape Dental Injury: Teeth and Oropharynx as per pre-operative assessment

## 2021-02-26 NOTE — Discharge Instructions (Addendum)
[facility], [place] [phone]  MASTECTOMY: POST OP INSTRUCTIONS Take 400 mg of ibuprofen every 8 hours or 650 mg tylenol every 6 hours for next 72 hours then as needed. Use ice several times daily also. Always review your discharge instruction sheet given to you by the facility where your surgery was performed. IF YOU HAVE DISABILITY OR FAMILY LEAVE FORMS, YOU MUST BRING THEM TO THE OFFICE FOR PROCESSING.   DO NOT GIVE THEM TO YOUR DOCTOR. A prescription for pain medication may be given to you upon discharge.  Take your pain medication as prescribed, if needed.  If narcotic pain medicine is not needed, then you may take acetaminophen (Tylenol), naprosyn (Alleve) or ibuprofen (Advil) as needed. Take your usually prescribed medications unless otherwise directed. If you need a refill on your pain medication, please contact your pharmacy.  They will contact our office to request authorization.  Prescriptions will not be filled after 5pm or on week-ends. You should follow a light diet the first few days after arrival home, such as soup and crackers, etc.  Resume your normal diet the day after surgery. Most patients will experience some swelling and bruising on the chest and underarm.  Ice packs will help.  Swelling and bruising can take several days to resolve. Wear the binder day and night until you return to the office.  It is common to experience some constipation if taking pain medication after surgery.  Increasing fluid intake and taking a stool softener (such as Colace) will usually help or prevent this problem from occurring.  A mild laxative (Milk of Magnesia or Miralax) should be taken according to package instructions if there are no bowel movements after 48 hours. Unless discharge instructions indicate otherwise, leave your bandage dry and in place until your next appointment in 3-5 days.  You may take a limited sponge bath.  No tube baths or showers until the drains are removed.   You may have steri-strips (small skin tapes) in place directly over the incision.  These strips should be left on the skin for 7-10 days. If you have glue it will come off in next couple week.  Any sutures will be removed at an office visit DRAINS:  If you have drains in place, it is important to keep a list of the amount of drainage produced each day in your drains.  Before leaving the hospital, you should be instructed on drain care.  Call our office if you have any questions about your drains. I will remove your drains when they put out less than 30 cc or ml for 2 consecutive days. ACTIVITIES:  You may resume regular (light) daily activities beginning the next day--such as daily self-care, walking, climbing stairs--gradually increasing activities as tolerated.  You may have sexual intercourse when it is comfortable.  Refrain from any heavy lifting or straining until approved by your doctor. You may drive when you are no longer taking prescription pain medication, you can comfortably wear a seatbelt, and you can safely maneuver your car and apply brakes. RETURN TO WORK:  __________________________________________________________ Dennis Bast should see your doctor in the office for a follow-up appointment approximately 3-5 days after your surgery.  Your doctor's nurse will typically make your follow-up appointment when she calls you with your pathology report.  Expect your pathology report 3-4business days after surgery. OTHER INSTRUCTIONS: ______________________________________________________________________________________________ ____________________________________________________________________________________________ WHEN TO CALL YOUR DR [place]: Fever over 101.0 Nausea and/or vomiting Extreme swelling or bruising Continued bleeding from incision. Increased pain, redness,  or drainage from the incision. The clinic staff is available to answer your questions during regular business hours.  Please don't  hesitate to call and ask to speak to one of the nurses for clinical concerns.  If you have a medical emergency, go to the nearest emergency room or call 911.  A surgeon from [facility] is always on call at the hospital. [address] [place], [place], [place]  [address] ? [address], [place], [place]   [address] [phone] ? [phone] ? FAX [phone] Web site: [email or web address]   Post Anesthesia Home Care Instructions  Activity: Get plenty of rest for the remainder of the day. A responsible individual must stay with you for 24 hours following the procedure.  For the next 24 hours, DO NOT: -Drive a car -Paediatric nurse -Drink alcoholic beverages -Take any medication unless instructed by your physician -Make any legal decisions or sign important papers.  Meals: Start with liquid foods such as gelatin or soup. Progress to regular foods as tolerated. Avoid greasy, spicy, heavy foods. If nausea and/or vomiting occur, drink only clear liquids until the nausea and/or vomiting subsides. Call your physician if vomiting continues.  Special Instructions/Symptoms: Your throat may feel dry or sore from the anesthesia or the breathing tube placed in your throat during surgery. If this causes discomfort, gargle with warm salt water. The discomfort should disappear within 24 hours.  If you had a scopolamine patch placed behind your ear for the management of post- operative nausea and/or vomiting:  1. The medication in the patch is effective for 72 hours, after which it should be removed.  Wrap patch in a tissue and discard in the trash. Wash hands thoroughly with soap and water. 2. You may remove the patch earlier than 72 hours if you experience unpleasant side effects which may include dry mouth, dizziness or visual disturbances. 3. Avoid touching the patch. Wash your hands with soap and water after contact with the patch.          JP Drain  [employer] this sheet to all of your post-operative appointments while you have your drains. Please measure your drains by CC's or ML's. Make sure you drain and measure your JP Drains 2 or 3 times per day. At the end of each day, add up totals for the left side and add up totals for the right side.    ( 9 am )     ( 3 pm )        ( 9 pm )                Date L  R  L  R  L  R  Total L/R

## 2021-02-26 NOTE — Anesthesia Procedure Notes (Signed)
Anesthesia Regional Block: Pectoralis block   Pre-Anesthetic Checklist: , timeout performed,  Correct Patient, Correct Site, Correct Laterality,  Correct Procedure, Correct Position, site marked,  Risks and benefits discussed,  Surgical consent,  Pre-op evaluation,  At surgeon's request and post-op pain management  Laterality: Left  Prep: chloraprep       Needles:  Injection technique: Single-shot  Needle Type: Echogenic Stimulator Needle     Needle Length: 9cm  Needle Gauge: 21     Additional Needles:   Procedures:,,,, ultrasound used (permanent image in chart),,    Narrative:  Start time: 02/26/2021 10:10 AM End time: 02/26/2021 10:20 AM Injection made incrementally with aspirations every 5 mL.  Performed by: Personally  Anesthesiologist: Murvin Natal, MD  Additional Notes: Functioning IV was confirmed and monitors were applied.  A timeout was performed. Sterile prep, hand hygiene and sterile gloves were used. A 38mm 21ga Arrow echogenic stimulator needle was used. Negative aspiration and negative test dose prior to incremental administration of local anesthetic. The patient tolerated the procedure well.  Ultrasound guidance: relevent anatomy identified, needle position confirmed, local anesthetic spread visualized around nerve(s), vascular puncture avoided.  Image printed for medical record.

## 2021-02-27 ENCOUNTER — Encounter (HOSPITAL_BASED_OUTPATIENT_CLINIC_OR_DEPARTMENT_OTHER): Payer: Self-pay | Admitting: General Surgery

## 2021-02-27 DIAGNOSIS — C50411 Malignant neoplasm of upper-outer quadrant of right female breast: Secondary | ICD-10-CM | POA: Diagnosis not present

## 2021-02-27 MED ORDER — TRAMADOL HCL 50 MG PO TABS
ORAL_TABLET | ORAL | Status: AC
  Filled 2021-02-27: qty 2

## 2021-03-02 ENCOUNTER — Telehealth: Payer: Self-pay | Admitting: Genetic Counselor

## 2021-03-02 ENCOUNTER — Ambulatory Visit: Payer: Self-pay | Admitting: Genetic Counselor

## 2021-03-02 DIAGNOSIS — C50411 Malignant neoplasm of upper-outer quadrant of right female breast: Secondary | ICD-10-CM

## 2021-03-02 DIAGNOSIS — Z803 Family history of malignant neoplasm of breast: Secondary | ICD-10-CM

## 2021-03-02 DIAGNOSIS — Z170 Estrogen receptor positive status [ER+]: Secondary | ICD-10-CM

## 2021-03-02 DIAGNOSIS — Z8042 Family history of malignant neoplasm of prostate: Secondary | ICD-10-CM

## 2021-03-02 DIAGNOSIS — Z1379 Encounter for other screening for genetic and chromosomal anomalies: Secondary | ICD-10-CM

## 2021-03-02 DIAGNOSIS — Z853 Personal history of malignant neoplasm of breast: Secondary | ICD-10-CM

## 2021-03-02 NOTE — Telephone Encounter (Signed)
Revealed negative genetic testing.  Discussed that we do not know why she has breast cancer or why there is cancer in the family. It could be sporadic/familial, due to a different gene that we are not testing, or maybe our current technology may not be able to pick something up.  It will be important for her to keep in contact with genetics to keep up with whether additional testing may be needed.   

## 2021-03-02 NOTE — Telephone Encounter (Signed)
Contacted patient in attempt to disclose results of genetic testing.  LVM with contact information requesting a call back.  

## 2021-03-02 NOTE — Progress Notes (Signed)
HPI:   Jaclyn Lopez was previously seen in the [place] clinic due to a personal and family history of cancer and concerns regarding a hereditary predisposition to cancer. Please refer to our prior cancer genetics clinic note for more information regarding our discussion, assessment and recommendations, at the time. Jaclyn Lopez recent genetic test results were disclosed to her, as were recommendations warranted by these results. These results and recommendations are discussed in more detail below.  CANCER HISTORY:  Oncology History  Malignant neoplasm of upper-outer quadrant of right breast in female, estrogen receptor positive ([place])  02/04/2021 Initial Diagnosis   Screening mammogram: focal asymmetry and calcifications in the right breast. Diagnostic mammogram: irregular mass. Biopsy: invasive mammary carcinoma and mammary carcinoma in situ, ER+(90%)/PR+(70%)/Her2-.  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   02/11/2021 Cancer Staging   Staging form: Breast, AJCC 8th Edition - Clinical stage from 02/11/2021: Stage IA (cT1b, cN0, cM0, G2, ER+, PR+, HER2-) - Signed by Nicholas Lose, MD on 02/11/2021 Stage prefix: Initial diagnosis Histologic grading system: 3 grade system Laterality: Right Staged by: Pathologist and managing physician Stage used in treatment planning: Yes National guidelines used in treatment planning: Yes Type of national guideline used in treatment planning: NCCN   02/17/2021 Genetic Testing   Negative hereditary cancer genetic testing: no pathogenic variants detected in [facility] BRCAPlus Panel or CancerNext-Expanded +RNAinsight Panel.  The report dates are February 17, 2021 and February 25, 2021, respectively.   The BRCAplus panel offered by [employer] and includes sequencing and deletion/duplication analysis for the following 8 genes: ATM, BRCA1, BRCA2, CDH1, CHEK2, PALB2, PTEN, and TP53.  The CancerNext-Expanded gene panel offered by [facility] and includes sequencing, rearrangement, and RNA analysis for the following 77 genes: AIP, ALK, APC, ATM, AXIN2, BAP1, BARD1, BLM, BMPR1A, BRCA1, BRCA2, BRIP1, CDC73, CDH1, CDK4, CDKN1B, CDKN2A, CHEK2, CTNNA1, DICER1, FANCC, FH, FLCN, GALNT12, KIF1B, LZTR1, MAX, MEN1, MET, MLH1, MSH2, MSH3, MSH6, MUTYH, NBN, NF1, NF2, NTHL1, PALB2, PHOX2B, PMS2, POT1, PRKAR1A, PTCH1, PTEN, RAD51C, RAD51D, RB1, RECQL, RET, SDHA, SDHAF2, SDHB, SDHC, SDHD, SMAD4, SMARCA4, SMARCB1, SMARCE1, STK11, SUFU, TMEM127, TP53, TSC1, TSC2, VHL and XRCC2 (sequencing and deletion/duplication); EGFR, EGLN1, HOXB13, KIT, MITF, PDGFRA, POLD1, and POLE (sequencing only); EPCAM and GREM1 (deletion/duplication only).      FAMILY HISTORY:  We obtained a detailed, 4-generation family history.  Significant diagnoses are listed below: Family History  Problem Relation Age of Onset   Lung cancer Father 55       smoking hx   Prostate cancer Maternal Uncle        dx > 66   Breast cancer Maternal Grandmother 90   Jaclyn Lopez is unaware of previous family history of genetic testing for hereditary cancer risks.  There is no reported Ashkenazi Jewish ancestry. There is no known consanguinity.    GENETIC TEST RESULTS:  The [facility] CancerNext-Expanded +RNAinsight Panel found no pathogenic mutations. The CancerNext-Expanded gene panel offered by [facility] and includes sequencing, rearrangement, and RNA analysis for the following 77 genes: AIP, ALK, APC, ATM, AXIN2, BAP1, BARD1, BLM, BMPR1A, BRCA1, BRCA2, BRIP1, CDC73, CDH1, CDK4, CDKN1B, CDKN2A, CHEK2, CTNNA1, DICER1, FANCC, FH, FLCN, GALNT12, KIF1B, LZTR1, MAX, MEN1, MET, MLH1, MSH2, MSH3, MSH6, MUTYH, NBN, NF1, NF2, NTHL1, PALB2, PHOX2B, PMS2, POT1, PRKAR1A, PTCH1, PTEN, RAD51C, RAD51D, RB1, RECQL, RET, SDHA, SDHAF2, SDHB, SDHC, SDHD, SMAD4, SMARCA4, SMARCB1, SMARCE1, STK11, SUFU, TMEM127, TP53, TSC1, TSC2, VHL and XRCC2 (sequencing and deletion/duplication); EGFR, EGLN1, HOXB13, KIT,  MITF, PDGFRA,  POLD1, and POLE (sequencing only); EPCAM and GREM1 (deletion/duplication only).   The test report has been scanned into EPIC and is located under the Molecular Pathology section of the Results Review tab.  A portion of the result report is included below for reference. Genetic testing reported out on February 25, 2021.        Even though a pathogenic variant was not identified, possible explanations for the cancer in the family may include: There may be no hereditary risk for cancer in the family. The cancers in Ms. Walker and/or their family may be due to other genetic or environmental factors. There may be a gene mutation in one of these genes that current testing methods cannot detect, but that chance is small. There could be another gene that has not yet been discovered, or that we have not yet tested, that is responsible for the cancer diagnoses in the family.  It is also possible there is a hereditary cause for the cancer in the family that Ms. Walker did not inherit.  Therefore, it is important to remain in touch with cancer genetics in the future so that we can continue to offer Ms. Walker the most up to date genetic testing.   ADDITIONAL GENETIC TESTING:  We discussed with Ms. Walker that her genetic testing was fairly extensive.  If there are genes identified to increase cancer risk that can be analyzed in the future, we would be happy to discuss and coordinate this testing at that time.    CANCER SCREENING RECOMMENDATIONS:  Jaclyn Lopez test result is considered negative (normal).  This means that we have not identified a hereditary cause for her personal history of cancer at this time.   An individual's cancer risk and medical management are not determined by genetic test results alone. Overall cancer risk assessment incorporates additional factors, including personal medical history, family history, and any available genetic information that may result in a  personalized plan for cancer prevention and surveillance. Therefore, it is recommended she continue to follow the cancer management and screening guidelines provided by her oncology and primary healthcare provider.  RECOMMENDATIONS FOR FAMILY MEMBERS:   Since she did not inherit a mutation in a cancer predisposition gene included on this panel, her children could not have inherited a mutation from her in one of these genes. Individuals in this family might be at some increased risk of developing cancer, over the general population risk, due to the family history of cancer.  Family members should notify their providers of the family history of cancer. We recommend women in this family have a yearly mammogram beginning at age 33, or 40 years younger than the earliest onset of cancer, an annual clinical breast exam, and perform monthly breast self-exams.  Individuals should receive colonoscopies as directed by their providers.   Lastly, we discussed with Ms. Walker that cancer genetics is a rapidly advancing field and it is possible that new genetic tests will be appropriate for her and/or her family members in the future. We encouraged her to remain in contact with cancer genetics on an annual basis so we can update her personal and family histories and let her know of advances in cancer genetics that may benefit this family.   Our contact number was provided. Jaclyn Lopez questions were answered to her satisfaction, and she knows she is welcome to call us at anytime with additional questions or concerns.   Cari M. Joette Catching, [place], [facility] Genetic Counselor [email or web address] (P) [phone]

## 2021-03-02 NOTE — Discharge Summary (Signed)
Physician Discharge Summary  Patient ID: Jaclyn Lopez MRN: [id number] DOB/AGE: 07-24-1950 70 y.o.  Admit date: 02/26/2021 Discharge date: 03/02/2021  Admission Diagnoses: Breast cancer, right   Discharge Diagnoses:  Active Problems:   S/P bilateral mastectomy   Discharged Condition: good  Hospital Course: 68 yof who has prior right breast cancer and has a new one.  She has undergone bilateral mastectomies and right axillary sentinel node biopsy. She is doing well and discharged home  Consults: None  Significant Diagnostic Studies: none  Treatments: surgery: see above  Discharge Exam: Blood pressure (!) 119/59, pulse 68, temperature 98.1 F (36.7 C), resp. rate 18, height 5\' 6"  (1.676 m), weight 66.4 kg, SpO2 97 %. Incisions clean, flaps viable, drains serosang  Disposition: Discharge disposition: 01-Home or Self Care       Discharge Instructions     Discharge patient   Complete by: As directed    Discharge disposition: 01-Home or Self Care   Discharge patient date: 02/27/2021      Allergies as of 02/27/2021       Reactions   Symmetrel [amantadine Hcl] Hives   Tamiflu [oseltamivir Phosphate] Hives        Medication List     TAKE these medications    aspirin 81 MG EC tablet Take 1 tablet (81 mg total) by mouth daily. Swallow whole.   atorvastatin 40 MG tablet Commonly known as: LIPITOR 1 tablet   Biotin 5 MG Caps Take 5 mg by mouth daily.   buPROPion 150 MG 24 hr tablet Commonly known as: WELLBUTRIN XL Take 150 mg by mouth every morning.   calcium carbonate 500 MG chewable tablet Commonly known as: TUMS - dosed in mg elemental calcium Chew 1 tablet by mouth daily as needed for indigestion or heartburn.   cholecalciferol 25 MCG (1000 UNIT) tablet Commonly known as: VITAMIN D3 Take 1,000 Units by mouth daily.   Fish Oil 1000 MG Caps Take by mouth.   FLUoxetine 20 MG capsule Commonly known as: PROZAC Take 20 mg by mouth daily.    ibuprofen 200 MG tablet Commonly known as: ADVIL Take 200 mg by mouth every 6 (six) hours as needed for headache or moderate pain.   levothyroxine 50 MCG tablet Commonly known as: SYNTHROID Take 50 mcg by mouth daily.   ME/NaPhos/MB/Hyo1 81.6 MG Tabs Take 1 tablet by mouth daily as needed (urinary pain).   mirtazapine 15 MG tablet Commonly known as: REMERON Take 3.75 mg by mouth at bedtime.   omeprazole 40 MG capsule Commonly known as: PRILOSEC Take 40 mg by mouth daily as needed (acid reflux).   traMADol 50 MG tablet Commonly known as: ULTRAM Take 2 tablets (100 mg total) by mouth every 6 (six) hours as needed. Notes to patient: Last dose at 7:30 am 02/27/21   zaleplon 10 MG capsule Commonly known as: SONATA Take 10 mg by mouth at bedtime as needed for sleep.        Follow-up Information     Rolm Bookbinder, MD Follow up in 2 week(s).   Specialty: General Surgery Contact information: [address] [place] [address] [phone]                 Signed: Rolm Bookbinder 03/02/2021, 3:08 PM

## 2021-03-04 ENCOUNTER — Encounter: Payer: Self-pay | Admitting: Genetic Counselor

## 2021-03-04 LAB — SURGICAL PATHOLOGY

## 2021-03-05 ENCOUNTER — Telehealth: Payer: Self-pay | Admitting: *Deleted

## 2021-03-05 ENCOUNTER — Encounter: Payer: Self-pay | Admitting: *Deleted

## 2021-03-05 NOTE — Telephone Encounter (Signed)
Ordered oncotype per Dr. Gudena. Faxed requisition to pathology and [facility] sciences 

## 2021-03-07 NOTE — Progress Notes (Signed)
Patient Care Team: Deland Pretty, MD as PCP - General (Internal Medicine) Mauro Kaufmann, RN as Oncology Nurse Navigator Rockwell Germany, RN as Oncology Nurse Navigator Rolm Bookbinder, MD as Consulting Physician (General Surgery) Nicholas Lose, MD as Consulting Physician (Hematology and Oncology) Gery Pray, MD as Consulting Physician (Radiation Oncology)  DIAGNOSIS:    ICD-10-CM   1. Malignant neoplasm of upper-outer quadrant of right breast in female, estrogen receptor positive ([place])  C50.411    Z17.0       SUMMARY OF ONCOLOGIC HISTORY: Oncology History  Malignant neoplasm of upper-outer quadrant of right breast in female, estrogen receptor positive ([place])  02/04/2021 Initial Diagnosis   Screening mammogram: focal asymmetry and calcifications in the right breast. Diagnostic mammogram: irregular mass. Biopsy: invasive mammary carcinoma and mammary carcinoma in situ, ER+(90%)/PR+(70%)/Her2-.  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   02/11/2021 Cancer Staging   Staging form: Breast, AJCC 8th Edition - Clinical stage from 02/11/2021: Stage IA (cT1b, cN0, cM0, G2, ER+, PR+, HER2-) - Signed by Nicholas Lose, MD on 02/11/2021 Stage prefix: Initial diagnosis Histologic grading system: 3 grade system Laterality: Right Staged by: Pathologist and managing physician Stage used in treatment planning: Yes National guidelines used in treatment planning: Yes Type of national guideline used in treatment planning: NCCN    02/17/2021 Genetic Testing   Negative hereditary cancer genetic testing: no pathogenic variants detected in [facility] BRCAPlus Panel or CancerNext-Expanded +RNAinsight Panel.  The report dates are February 17, 2021 and February 25, 2021, respectively.   The BRCAplus panel offered by [employer] and includes sequencing and deletion/duplication analysis for the following 8 genes: ATM, BRCA1, BRCA2, CDH1, CHEK2, PALB2, PTEN, and TP53.  The  CancerNext-Expanded gene panel offered by [facility] and includes sequencing, rearrangement, and RNA analysis for the following 77 genes: AIP, ALK, APC, ATM, AXIN2, BAP1, BARD1, BLM, BMPR1A, BRCA1, BRCA2, BRIP1, CDC73, CDH1, CDK4, CDKN1B, CDKN2A, CHEK2, CTNNA1, DICER1, FANCC, FH, FLCN, GALNT12, KIF1B, LZTR1, MAX, MEN1, MET, MLH1, MSH2, MSH3, MSH6, MUTYH, NBN, NF1, NF2, NTHL1, PALB2, PHOX2B, PMS2, POT1, PRKAR1A, PTCH1, PTEN, RAD51C, RAD51D, RB1, RECQL, RET, SDHA, SDHAF2, SDHB, SDHC, SDHD, SMAD4, SMARCA4, SMARCB1, SMARCE1, STK11, SUFU, TMEM127, TP53, TSC1, TSC2, VHL and XRCC2 (sequencing and deletion/duplication); EGFR, EGLN1, HOXB13, KIT, MITF, PDGFRA, POLD1, and POLE (sequencing only); EPCAM and GREM1 (deletion/duplication only).    02/26/2021 Surgery   Left mastectomy: ALH, 1 lymph node benign Right mastectomy: Grade 2 IDC, 2.3 cm, intermediate grade DCIS, margins negative, 0/2 lymph nodes negative ER 90%, PR 70%, HER2 negative, Ki-67 20%     CHIEF COMPLIANT: Follow-up of right breast cancer  INTERVAL HISTORY: Jaclyn Lopez is a 70 y.o. with above-mentioned history of right breast cancer. Bilateral mastectomies on 02/26/2021 showed adenosis with calcification in the left breast, grade 2 invasive ductal carcinoma and intermediate grade DCIS with margins uninvolved in the right breast, and no lymph nodes positive for carcinoma. She presents to the clinic today for follow-up.  She is healing and recovering very well from recent bilateral mastectomies.  She is waiting for the drains to come out in the next day or 2.  ALLERGIES:  is allergic to symmetrel [amantadine hcl] and tamiflu [oseltamivir phosphate].  MEDICATIONS:  Current Outpatient Medications  Medication Sig Dispense Refill   aspirin EC 81 MG EC tablet Take 1 tablet (81 mg total) by mouth daily. Swallow whole. 30 tablet 3   atorvastatin (LIPITOR) 40 MG tablet 1 tablet     Biotin 5 MG CAPS  Take 5 mg by mouth daily.     buPROPion  (WELLBUTRIN XL) 150 MG 24 hr tablet Take 150 mg by mouth every morning.     calcium carbonate (TUMS - DOSED IN MG ELEMENTAL CALCIUM) 500 MG chewable tablet Chew 1 tablet by mouth daily as needed for indigestion or heartburn.     cholecalciferol (VITAMIN D3) 25 MCG (1000 UNIT) tablet Take 1,000 Units by mouth daily.     FLUoxetine (PROZAC) 20 MG capsule Take 20 mg by mouth daily.     ibuprofen (ADVIL) 200 MG tablet Take 200 mg by mouth every 6 (six) hours as needed for headache or moderate pain.     levothyroxine (SYNTHROID, LEVOTHROID) 50 MCG tablet Take 50 mcg by mouth daily.     Methen-Hyosc-Meth Blue-Na Phos (ME/NAPHOS/MB/HYO1) 81.6 MG TABS Take 1 tablet by mouth daily as needed (urinary pain).     mirtazapine (REMERON) 15 MG tablet Take 3.75 mg by mouth at bedtime.      Omega-3 Fatty Acids (FISH OIL) 1000 MG CAPS Take by mouth.     omeprazole (PRILOSEC) 40 MG capsule Take 40 mg by mouth daily as needed (acid reflux).     traMADol (ULTRAM) 50 MG tablet Take 2 tablets (100 mg total) by mouth every 6 (six) hours as needed. 10 tablet 0   zaleplon (SONATA) 10 MG capsule Take 10 mg by mouth at bedtime as needed for sleep.     No current facility-administered medications for this visit.    PHYSICAL EXAMINATION: ECOG PERFORMANCE STATUS: 1 - Symptomatic but completely ambulatory  Vitals:   03/09/21 1150  BP: 122/72  Pulse: 82  Resp: 18  Temp: (!) 97.5 F (36.4 C)  SpO2: 98%   Filed Weights   03/09/21 1150  Weight: 142 lb 3.2 oz (64.5 kg)      LABORATORY DATA:  I have reviewed the data as listed CMP Latest Ref Rng & Units 02/11/2021 08/19/2020 05/05/2020  Glucose 70 - 99 mg/dL 103(H) - -  BUN 8 - 23 mg/dL 19 - -  Creatinine 0.44 - 1.00 mg/dL 1.14(H) - -  Sodium 135 - 145 mmol/L 138 - -  Potassium 3.5 - 5.1 mmol/L 4.5 - -  Chloride 98 - 111 mmol/L 104 - -  CO2 22 - 32 mmol/L 24 - -  Calcium 8.9 - 10.3 mg/dL 9.5 - -  Total Protein 6.5 - 8.1 g/dL 7.0 6.6 6.8  Total Bilirubin 0.3 -  1.2 mg/dL 0.7 0.6 0.5  Alkaline Phos 38 - 126 U/L 116 103 120(H)  AST 15 - 41 U/L _0 ALT 0 - 44 U/L _1 Lab Results  Component Value Date   WBC 7.8 02/11/2021   HGB 13.5 02/11/2021   HCT 40.1 02/11/2021   MCV 87.0 02/11/2021   PLT 206 02/11/2021   NEUTROABS 5.1 02/11/2021    ASSESSMENT & PLAN:  Malignant neoplasm of upper-outer quadrant of right breast in female, estrogen receptor positive ([place]) Bilateral mastectomies 02/26/2021 Left mastectomy: ALH, 1 lymph node benign Right mastectomy: Grade 2 IDC, 2.3 cm, intermediate grade DCIS, margins negative, 0/2 lymph nodes negative ER 90%, PR 70%, HER2 negative, Ki-67 20%  Pathology counseling: I discussed the final pathology report of the patient provided  a copy of this report. I discussed the margins.  We also discussed the final staging along with previously performed ER/PR testing.  Treatment plan: 1.  Oncotype DX testing to determine if chemotherapy is necessary  2. adjuvant antiestrogen therapy with letrozole will be started as soon as we get the Oncotype score and if it is low risk she will start letrozole 2.5 mg daily.  Return to clinic based upon Oncotype DX test result. If negative, we will send for antiestrogens and have her follow-up with survivorship care plan in 3 months.    No orders of the defined types were placed in this encounter.  The patient has a good understanding of the overall plan. she agrees with it. she will call with any problems that may develop before the next visit here.  Total time spent: 30 mins including face to face time and time spent for planning, charting and coordination of care  Rulon Eisenmenger, MD, MPH 03/09/2021  I, Thana Ates, am acting as scribe for Dr. Nicholas Lose.  I have reviewed the above documentation for accuracy and completeness, and I agree with the above.

## 2021-03-09 ENCOUNTER — Inpatient Hospital Stay: Payer: Medicare PPO | Attending: Hematology and Oncology | Admitting: Hematology and Oncology

## 2021-03-09 ENCOUNTER — Other Ambulatory Visit: Payer: Self-pay

## 2021-03-09 DIAGNOSIS — Z9013 Acquired absence of bilateral breasts and nipples: Secondary | ICD-10-CM | POA: Diagnosis not present

## 2021-03-09 DIAGNOSIS — C50411 Malignant neoplasm of upper-outer quadrant of right female breast: Secondary | ICD-10-CM | POA: Insufficient documentation

## 2021-03-09 DIAGNOSIS — N6021 Fibroadenosis of right breast: Secondary | ICD-10-CM | POA: Diagnosis not present

## 2021-03-09 DIAGNOSIS — N6022 Fibroadenosis of left breast: Secondary | ICD-10-CM | POA: Diagnosis not present

## 2021-03-09 DIAGNOSIS — Z170 Estrogen receptor positive status [ER+]: Secondary | ICD-10-CM | POA: Insufficient documentation

## 2021-03-09 DIAGNOSIS — Z79899 Other long term (current) drug therapy: Secondary | ICD-10-CM | POA: Insufficient documentation

## 2021-03-09 DIAGNOSIS — N6489 Other specified disorders of breast: Secondary | ICD-10-CM | POA: Insufficient documentation

## 2021-03-09 NOTE — Assessment & Plan Note (Signed)
Bilateral mastectomies 02/26/2021 Left mastectomy: ALH, 1 lymph node benign Right mastectomy: Grade 2 IDC, 2.3 cm, intermediate grade DCIS, margins negative, 0/2 lymph nodes negative ER 90%, PR 70%, HER2 negative, Ki-67 20%  Pathology counseling: I discussed the final pathology report of the patient provided  a copy of this report. I discussed the margins.  We also discussed the final staging along with previously performed ER/PR testing.  Treatment plan: 1.  Oncotype DX testing to determine if chemotherapy is necessary 2. adjuvant antiestrogen therapy  Return to clinic based upon Oncotype DX test result. If negative, we will send for antiestrogens and have her follow-up with survivorship care plan in 3 months.

## 2021-03-16 ENCOUNTER — Encounter (HOSPITAL_COMMUNITY): Payer: Self-pay

## 2021-03-16 DIAGNOSIS — Z170 Estrogen receptor positive status [ER+]: Secondary | ICD-10-CM | POA: Diagnosis not present

## 2021-03-16 DIAGNOSIS — C50411 Malignant neoplasm of upper-outer quadrant of right female breast: Secondary | ICD-10-CM | POA: Diagnosis not present

## 2021-03-20 ENCOUNTER — Encounter: Payer: Self-pay | Admitting: *Deleted

## 2021-03-20 ENCOUNTER — Other Ambulatory Visit: Payer: Self-pay | Admitting: *Deleted

## 2021-03-20 ENCOUNTER — Telehealth: Payer: Self-pay | Admitting: *Deleted

## 2021-03-20 DIAGNOSIS — Z170 Estrogen receptor positive status [ER+]: Secondary | ICD-10-CM

## 2021-03-20 DIAGNOSIS — C50411 Malignant neoplasm of upper-outer quadrant of right female breast: Secondary | ICD-10-CM

## 2021-03-20 MED ORDER — GABAPENTIN 300 MG PO CAPS: 300.0000 mg | ORAL_CAPSULE | Freq: Every day | ORAL | 0 refills | Status: DC

## 2021-03-20 MED ORDER — LETROZOLE 2.5 MG PO TABS: 2.5000 mg | ORAL_TABLET | Freq: Every day | ORAL | 3 refills | Status: DC

## 2021-03-20 NOTE — Telephone Encounter (Signed)
Received oncotype results 19/6%. Patient is aware.  I will call in letrozole for her per Dr. Lindi Adie and refer her to [facility].  She states she is having some nerve pain from her surgery. She has been taking ibuprofen but not helping much.  Explained to her that this is normal after surgery as the nerves heal and grow. Informed her she could try some gabapentin for this. I will send a prescription for this as well. Encouraged her to call with any other questions or concerns.  Patient verbalized understanding.

## 2021-03-23 ENCOUNTER — Other Ambulatory Visit: Payer: Self-pay

## 2021-03-23 ENCOUNTER — Ambulatory Visit: Payer: Medicare PPO | Attending: General Surgery

## 2021-03-23 DIAGNOSIS — R293 Abnormal posture: Secondary | ICD-10-CM | POA: Diagnosis present

## 2021-03-23 DIAGNOSIS — G8929 Other chronic pain: Secondary | ICD-10-CM | POA: Diagnosis present

## 2021-03-23 DIAGNOSIS — C50411 Malignant neoplasm of upper-outer quadrant of right female breast: Secondary | ICD-10-CM | POA: Diagnosis present

## 2021-03-23 DIAGNOSIS — R600 Localized edema: Secondary | ICD-10-CM | POA: Insufficient documentation

## 2021-03-23 DIAGNOSIS — M25612 Stiffness of left shoulder, not elsewhere classified: Secondary | ICD-10-CM | POA: Diagnosis present

## 2021-03-23 DIAGNOSIS — M25511 Pain in right shoulder: Secondary | ICD-10-CM | POA: Insufficient documentation

## 2021-03-23 DIAGNOSIS — M25611 Stiffness of right shoulder, not elsewhere classified: Secondary | ICD-10-CM | POA: Diagnosis present

## 2021-03-23 DIAGNOSIS — Z170 Estrogen receptor positive status [ER+]: Secondary | ICD-10-CM | POA: Insufficient documentation

## 2021-03-23 NOTE — Therapy (Signed)
[place] @ [place], [place], [address] Phone: [phone]   Fax:  [phone]  Physical Therapy Treatment  Patient Details  Name: Jaclyn Lopez MRN: [id number] Date of Birth: [birth year] Referring Provider (PT): Dr. Rolm Bookbinder   Encounter Date: 03/23/2021   PT End of Session - 03/23/21 1218     Visit Number 3    Number of Visits 15    Date for PT Re-Evaluation 05/04/21    Authorization Type [employer]    Authorization - Visit Number 15    PT Start Time 5035    PT Stop Time 4656    PT Time Calculation (min) 50 min    Activity Tolerance Patient tolerated treatment well    Behavior During Therapy [facility] for tasks assessed/performed             Past Medical History:  Diagnosis Date   Atypical glandular cells on Pap smear 2003   Cancer [facility]) 2005   Breast-Ductal CIS Right breast-Radiation - no lymph nodes removed per pt   CKD (chronic kidney disease)    CVA (cerebral vascular accident) ([place])    Depression    Family history of breast cancer 02/12/2021   Family history of prostate cancer 02/12/2021   GERD (gastroesophageal reflux disease)    Herpes progenitalis    History of COVID-19 10/15/2020   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 Subsequent LEEP showed CIN-2 with clear margins and negative ECC   Osteopenia 11/2018   T score -1.5 FRAX 9% / 1% improved at both hips stable at spine   Personal history of breast cancer 02/12/2021   PFO (patent foramen ovale)    PONV (postoperative nausea and vomiting)    Stroke ([place]) 11/2019   Thyroid disease    Hyperthyroid    Past Surgical History:  Procedure Laterality Date   Bladder stretch  and Bx  1990   BREAST LUMPECTOMY  2005   right; cancer   BREAST LUMPECTOMY  2006   left; pre cancer   BREAST SURGERY     Reduction   BUNIONECTOMY  2007, 2011   CATARACT EXTRACTION  06/17/2020   CERVICAL CONE BIOPSY  2003   CESAREAN SECTION  1983   DILATION AND CURETTAGE OF UTERUS  2009   ENDOMETRIAL ABLATION     Novasure   GANGLION CYST EXCISION Left 1985   HYSTEROSCOPY  2009   LEEP  05/2015   CIN-2 with clear margins   MASTECTOMY W/ SENTINEL NODE BIOPSY Right 02/26/2021   Procedure: RIGHT MASTECTOMY WITH AXILLARY SENTINEL LYMPH NODE BIOPSY;  Surgeon: Rolm Bookbinder, MD;  Location: [place];  Service: General;  Laterality: Right;   MASTECTOMY, PARTIAL     MOUTH SURGERY  2012   implants   TOTAL MASTECTOMY Left 02/26/2021   Procedure: LEFT TOTAL MASTECTOMY;  Surgeon: Rolm Bookbinder, MD;  Location: [place];  Service: General;  Laterality: Left;   TUBAL LIGATION  1984    There were no vitals filed for this visit.   Subjective Assessment - 03/23/21 1107     Subjective I am still having some pain around the incisions and the right arm pinches when  I straighten my arm out.  I still have some numbness and tingling.  Difficulty with reaching up and out to the side mostly with the right side. Not sleeping really well.  Had drains removed about 1 week ago. I have been doing the post-op exercises. Haven't had the scapular pain. Byt he end of the day I feel like I have a tight band around my chest.    Pertinent History Patient was diagnosed on 01/07/2021 with right grade II invasive ductal carcinoma breast cancer. It measures 1 cm with calcifications in the upper outer quadrant. It is ER/PR positive and HER2 negative with a Ki67 of 20%. She has a previous history of right breast DCIS with a lumpectomy and radiation in 2005. Bilateral mastectomies were performed on 02/26/2021 with 0/2 LN's on right and 0/1 LN on left.    Patient Stated Goals Post op  assessment/ Be able to reach better    Currently in Pain? Yes    Pain Score 1    but pain increases when she raises her arm.   Pain Location Axilla   and right medial arm   Pain Orientation Right    Pain Descriptors / Indicators Other (Comment);Tightness   Pinching,   Pain Type Surgical pain    Pain Onset 1 to 4 weeks ago    Pain Frequency Constant   right arm   Aggravating Factors  reaching up and over    Pain Relieving Factors warm water from shower    Effect of Pain on Daily Activities limits reaching                [facility] PT Assessment - 03/23/21 0001       Assessment   Medical Diagnosis Right breast cancer and right scapula pain    Referring Provider (PT) Dr. Rolm Bookbinder    Onset Date/Surgical Date 04/28/21    Hand Dominance Right    Prior Therapy 2  visits      Precautions   Precaution Comments lymphedema risk      Restrictions   Weight Bearing Restrictions No      Balance Screen   Has the patient fallen in the past 6 months No    Has the patient had a decrease in activity level because of a fear of falling?  No    Is the patient reluctant to leave their home because of a fear of falling?  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 Mild swelling right greater than left chest and axillary region.  Steri strips still present over incision. Coding noted Right axilla and medial arm      Posture/Postural Control   Posture/Postural Control Postural  limitations    Postural Limitations Rounded Shoulders;Forward head;Increased thoracic kyphosis      AROM   Overall AROM Comments Cervical AROM is WNL    Right Shoulder Extension 38 Degrees    Right Shoulder  Flexion 108 Degrees    Right Shoulder ABduction 77 Degrees    Right Shoulder Internal Rotation --   not assessed secondary to decreased abd   Right Shoulder External Rotation --   not assessed secondary to decreased abduction   Left Shoulder Extension 50 Degrees    Left Shoulder Flexion 124 Degrees    Left Shoulder ABduction 110 Degrees    Left Shoulder Internal Rotation 47 Degrees    Left Shoulder External Rotation 95 Degrees      Strength   Overall Strength --   not assessed secondary to healing     Palpation   Palpation comment hypersensitive to light touch at right medial arm with cording present.               LYMPHEDEMA/ONCOLOGY QUESTIONNAIRE - 03/23/21 0001       Type   Cancer Type Right breast cancer      Surgeries   Mastectomy Date 02/26/21    Lumpectomy Date 04/27/04    Number Lymph Nodes Removed --   0/2 right, 0/1 left     Treatment   Active Chemotherapy Treatment No    Past Chemotherapy Treatment No    Active Radiation Treatment No    Past Radiation Treatment Yes    Current Hormone Treatment Yes    Drug Name Letrozole    Past Hormone Therapy Yes      What other symptoms do you have   Are you Having Heaviness or Tightness Yes    Are you having Pain Yes    Are you having pitting edema No    Is it Hard or Difficult finding clothes that fit No    Do you have infections No      Right Upper Extremity Lymphedema   10 cm Proximal to Olecranon Process 27.9 cm    Olecranon Process 24 cm    10 cm Proximal to Ulnar Styloid Process 20 cm    Just Proximal to Ulnar Styloid Process 14.5 cm    At Base of 2nd Digit 6 cm      Left Upper Extremity Lymphedema   10 cm Proximal to Olecranon Process 27.4 cm    Olecranon Process 23.8 cm    10 cm Proximal to  Ulnar Styloid Process 19.5 cm    Just Proximal to Ulnar Styloid Process 15 cm    At Base of 2nd Digit 5.9 cm                Quick Dash - 03/23/21 0001     Open a tight or new jar Moderate difficulty    Do heavy household chores (wash walls, wash floors) Unable    Carry a shopping bag or briefcase Moderate difficulty    Wash your back Unable    Use a knife to cut food No difficulty    Recreational activities in which you take some force or impact through your arm, shoulder, or hand (golf, hammering, tennis) Unable    During the past week, to what extent has your arm, shoulder or hand problem interfered with your normal social activities with family, friends, neighbors, or groups? Not at all    During the past week, to what extent has your arm, shoulder or hand problem limited your work or other regular daily activities Modererately    Arm, shoulder, or hand pain. Moderate    Tingling (pins and needles) in your arm, shoulder, or hand Severe    Difficulty Sleeping Moderate difficulty  DASH Score 56.82 %                             PT Education - 03/23/21 1217     Education Details pt was educated to perform AA shoulder flexion and stargazer in supine, and to try wall NTS to help with cordig    Person(s) Educated Patient    Methods Explanation;Demonstration   has a handout already   Comprehension Returned demonstration              PT Short Term Goals - 02/18/21 0854       PT SHORT TERM GOAL #1   Title Patient will be independent with a home exercise program.    Status On-going               PT Long Term Goals - 03/23/21 1227       PT LONG TERM GOAL #1   Title Patient will demonstrate she has regained full shoulder ROM and function post operatively compared to baselines.    Time 6    Period Weeks    Status On-going    Target Date 05/04/21      PT LONG TERM GOAL #2   Title Pt will have decreased pain/discomfort from cording on right  by 75%    Time 6    Period Weeks    Status New    Target Date 05/04/21      PT LONG TERM GOAL #3   Title Pt will attend ABC class    Time 6    Period Weeks    Status New    Target Date 05/04/21      PT LONG TERM GOAL #4   Title Pts quick dash will improve to no greater than 25%    Baseline 56%    Time 6    Period Weeks    Target Date 05/04/21                   Plan - 03/23/21 1221     Clinical Impression Statement Pt will be 4 weeks post op bilateral mastectomy in 3 days. She presents with limitations in shoulder AROM right greater than left with pt feeling the effects of cording on the right with significant hypersensitivity at the right medial arm.  Incisions are healing and continue with steri strips, and right lateral incision is quite red.  Pt reports MD saw this last week and was not concerned, but a photo was taken and placed in Media and MD was messaged just to be sure.  There is some mild right chest and axillary swelling greater than left. She will benefit from skilled therapy to address deficits and return to PLOF.   Stability/Clinical Decision Making Stable/Uncomplicated    Rehab Potential Excellent    PT Frequency 2x / week    PT Duration 6 weeks    PT Treatment/Interventions ADLs/Self Care Home Management;Therapeutic exercise;Patient/family education;Passive range of motion;Manual techniques;Dry needling;Scar mobilization;Manual lymph drainage    PT Next Visit Plan check redness at right chest,MFR techniques to right UE cording, PROM, STM prn, MLD to areas of swelling especially on right    PT Home Exercise Plan 4 post op exercises, NTS    Consulted and Agree with Plan of Care Patient;Family member/caregiver             Patient will benefit from skilled therapeutic intervention in order to improve the following deficits  and impairments:  Postural dysfunction, Decreased range of motion, Decreased knowledge of precautions, Impaired UE functional use, Pain,  Decreased skin integrity, Increased edema, Impaired sensation, Decreased scar mobility  Visit Diagnosis: Abnormal posture  Chronic right shoulder pain  Localized edema  Stiffness of right shoulder, not elsewhere classified  Stiffness of left shoulder, not elsewhere classified  Malignant neoplasm of upper-outer quadrant of right breast in female, estrogen receptor positive ([place])     Problem List Patient Active Problem List   Diagnosis Date Noted   S/P bilateral mastectomy 02/26/2021   Genetic testing 02/18/2021   Family history of breast cancer 02/12/2021   Personal history of breast cancer 02/12/2021   Family history of prostate cancer 02/12/2021   Malignant neoplasm of upper-outer quadrant of right breast in female, estrogen receptor positive ([place]) 02/04/2021   CVA (cerebral vascular accident) ([place]) 11/29/2019   Depression 11/29/2019   CKD (chronic kidney disease) stage 3, GFR 30-59 ml/min ([facility]) 11/29/2019   Nausea and vomiting 03/15/2019   Gastroesophageal reflux disease 03/15/2019   Loose stools 03/15/2019   Elevated alkaline phosphatase level 03/15/2019   Herpes progenitalis    Osteopenia    Hypothyroidism    IC (interstitial cystitis)    DUCTAL CARCINOMA IN SITU, RIGHT BREAST 03/12/2008   DIVERTICULOSIS OF COLON 03/12/2008    Claris Pong, PT 03/23/2021, 12:30 PM  [place] @ [place], [place], [address] Phone: [phone]   Fax:  [phone]  Name: CLEONE HULICK MRN: [id number] Date of Birth: 03-26-1951

## 2021-03-31 ENCOUNTER — Ambulatory Visit: Payer: Medicare PPO | Admitting: Physical Therapy

## 2021-03-31 ENCOUNTER — Telehealth: Payer: Self-pay | Admitting: Adult Health

## 2021-03-31 NOTE — Telephone Encounter (Signed)
Scheduled per sch msg. Called and left msg  

## 2021-04-02 ENCOUNTER — Encounter: Payer: Self-pay | Admitting: Physical Therapy

## 2021-04-02 ENCOUNTER — Ambulatory Visit: Payer: Medicare PPO | Admitting: Physical Therapy

## 2021-04-02 ENCOUNTER — Other Ambulatory Visit: Payer: Self-pay

## 2021-04-02 DIAGNOSIS — M25612 Stiffness of left shoulder, not elsewhere classified: Secondary | ICD-10-CM

## 2021-04-02 DIAGNOSIS — Z170 Estrogen receptor positive status [ER+]: Secondary | ICD-10-CM | POA: Diagnosis not present

## 2021-04-02 DIAGNOSIS — C50411 Malignant neoplasm of upper-outer quadrant of right female breast: Secondary | ICD-10-CM

## 2021-04-02 DIAGNOSIS — R293 Abnormal posture: Secondary | ICD-10-CM | POA: Diagnosis not present

## 2021-04-02 DIAGNOSIS — R600 Localized edema: Secondary | ICD-10-CM

## 2021-04-02 DIAGNOSIS — M25511 Pain in right shoulder: Secondary | ICD-10-CM | POA: Diagnosis not present

## 2021-04-02 DIAGNOSIS — G8929 Other chronic pain: Secondary | ICD-10-CM | POA: Diagnosis not present

## 2021-04-02 DIAGNOSIS — M25611 Stiffness of right shoulder, not elsewhere classified: Secondary | ICD-10-CM | POA: Diagnosis not present

## 2021-04-02 NOTE — Patient Instructions (Signed)
SHOULDER: Flexion - Supine (Cane)        Cancer Rehab [phone]    Hold cane in both hands. Raise arms up overhead. Do not allow back to arch. Hold _5__ seconds. Do __5-10__ times; __1-2__ times a day.   SELF ASSISTED WITH OBJECT: Shoulder Abduction / Adduction - Supine    Hold cane with both hands. Move both arms from side to side, keep elbows straight.  Hold when stretch felt for __5__ seconds. Repeat __5-10__ times; __1-2__ times a day. Once this becomes easier progress to third picture bringing affected arm towards ear by staying out to side. Same hold for _5_seconds. Repeat  _5-10_ times, _1-2_ times/day.  Wear TG soft doubled over down to below elbow as needed for comfort

## 2021-04-02 NOTE — Therapy (Signed)
[place] @ [place], [place], [address] Phone: [phone]   Fax:  [phone]  Physical Therapy Treatment  Patient Details  Name: Jaclyn Lopez MRN: [id number] Date of Birth: 17-Nov-1950 Referring Provider (PT): Dr. Rolm Bookbinder   Encounter Date: 04/02/2021   PT End of Session - 04/02/21 1307     Visit Number 4    Number of Visits 15    Date for PT Re-Evaluation 05/04/21    PT Start Time 1200    PT Stop Time 3474    PT Time Calculation (min) 55 min    Activity Tolerance Patient tolerated treatment well    Behavior During Therapy [facility] for tasks assessed/performed             Past Medical History:  Diagnosis Date   Atypical glandular cells on Pap smear 2003   Cancer [facility]) 2005   Breast-Ductal CIS Right breast-Radiation - no lymph nodes removed per pt   CKD (chronic kidney disease)    CVA (cerebral vascular accident) ([place])    Depression    Family history of breast cancer 02/12/2021   Family history of prostate cancer 02/12/2021   GERD (gastroesophageal reflux disease)    Herpes progenitalis    History of COVID-19 10/15/2020   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 Subsequent LEEP showed CIN-2 with clear margins and negative ECC   Osteopenia 11/2018   T score -1.5 FRAX 9% / 1% improved at both hips stable at spine   Personal history of breast cancer 02/12/2021   PFO (patent foramen ovale)    PONV (postoperative nausea and vomiting)    Stroke ([place]) 11/2019   Thyroid disease    Hyperthyroid    Past Surgical History:  Procedure Laterality Date   Bladder stretch and Bx  1990   BREAST LUMPECTOMY  2005   right; cancer   BREAST  LUMPECTOMY  2006   left; pre cancer   BREAST SURGERY     Reduction   BUNIONECTOMY  2007, 2011   CATARACT EXTRACTION  06/17/2020   CERVICAL CONE BIOPSY  2003   CESAREAN SECTION  1983   DILATION AND CURETTAGE OF UTERUS  2009   ENDOMETRIAL ABLATION     Novasure   GANGLION CYST EXCISION Left 1985   HYSTEROSCOPY  2009   LEEP  05/2015   CIN-2 with clear margins   MASTECTOMY W/ SENTINEL NODE BIOPSY Right 02/26/2021   Procedure: RIGHT MASTECTOMY WITH AXILLARY SENTINEL LYMPH NODE BIOPSY;  Surgeon: Rolm Bookbinder, MD;  Location: [place];  Service: General;  Laterality: Right;   MASTECTOMY, PARTIAL     MOUTH SURGERY  2012   implants   TOTAL MASTECTOMY Left 02/26/2021   Procedure: LEFT TOTAL MASTECTOMY;  Surgeon: Rolm Bookbinder, MD;  Location: [place];  Service: General;  Laterality: Left;   TUBAL LIGATION  1984    There were no vitals filed for this visit.   Subjective Assessment - 04/02/21 1210     Subjective Pt says she is doing her exercises at home and is feeling a bit better but she is having the coding type of pain that starts at her underam  and goes down below her elbow.  She feels that she is having nerve pain across her chest    Pertinent History Patient was diagnosed on 01/07/2021 with right grade II invasive ductal carcinoma breast cancer. It measures 1 cm with calcifications in the upper outer quadrant. It is ER/PR positive and HER2 negative with a Ki67 of 20%. She has a previous history of right breast DCIS with a lumpectomy and radiation in 2005. Bilateral mastectomies were performed on 02/26/2021 with 0/2 LN's on right and 0/1 LN on left.    Patient Stated Goals Post op assessment/ Be able to reach better    Currently in Pain? Yes    Pain Score 2     Pain Location Axilla    Pain Orientation Right    Pain Descriptors / Indicators Other (Comment)   very sensitive to touch   Pain Type Surgical pain    Pain Radiating Towards down toward  elbow    Pain Onset 1 to 4 weeks ago    Pain Frequency Intermittent    Aggravating Factors  reaching up    Pain Relieving Factors relaxing                [facility] PT Assessment - 04/02/21 0001       AROM   Right Shoulder Flexion 110 Degrees    Right Shoulder ABduction 88 Degrees   with pain   Left Shoulder Flexion 150 Degrees    Left Shoulder ABduction 158 Degrees                           [facility] Adult PT Treatment/Exercise - 04/02/21 0001       Self-Care   Self-Care Other Self-Care Comments    Other Self-Care Comments  provided medium TG soft folded over to below elbow. Pt stated she felt immediate pain and pulling relief and was better able to perform shoulder dowel flexion with elbow straight      Exercises   Exercises Shoulder;Other Exercises    Other Exercises  neck and upper thoracic ROM for warm up      Shoulder Exercises: Supine   Protraction AROM;Right;Left;10 reps   right arm with elbow bent to prevent "pulling" in upper arm   External Rotation AROM;Right;10 reps    External Rotation Limitations elbow supported on pillow for better GH alignment    Flexion AAROM;Both;10 reps    Flexion Limitations with dowel. pt better to keep right arm straight after TG soft on arm    ABduction AAROM;Right;Left;5 reps   with dowel     Manual Therapy   Manual Therapy Scapular mobilization;Manual Lymphatic Drainage (MLD);Passive ROM    Manual therapy comments pt with thick eschar ( scab) over incision with some tissue loose. Pt cauioned not to pull on this as it might bleed . She will see Dr. Donne Hazel tomorrow and may start using vitamin e oil after she sees him    Manual Lymphatic Drainage (MLD) briefly to right lateral trunk, anterior chest and axilla with softening of tissue    Passive ROM to right shoulder                     PT Education - 04/02/21 1306     Education Details dowel supine shoulder flexion and abduction with tg soft for comfort.     Person(s) Educated Patient    Methods Explanation;Demonstration    Comprehension Returned demonstration;Verbalized understanding  PT Short Term Goals - 02/18/21 0854       PT SHORT TERM GOAL #1   Title Patient will be independent with a home exercise program.    Status On-going               PT Long Term Goals - 03/23/21 1227       PT LONG TERM GOAL #1   Title Patient will demonstrate she has regained full shoulder ROM and function post operatively compared to baselines.    Time 6    Period Weeks    Status On-going    Target Date 05/04/21      PT LONG TERM GOAL #2   Title Pt will have decreased pain/discomfort from cording on right by 75%    Time 6    Period Weeks    Status New    Target Date 05/04/21      PT LONG TERM GOAL #3   Title Pt will attend ABC class    Time 6    Period Weeks    Status New    Target Date 05/04/21      PT LONG TERM GOAL #4   Title Pts quick dash will improve to no greater than 25%    Baseline 56%    Time 6    Period Weeks    Target Date 05/04/21                   Plan - 04/02/21 1307     Clinical Impression Statement Pt continues to have tightness and pulling in right chest , axilla and arm though no cording was palpated.  She has redness and congestion aroud right incision . She has some tightness at left incision also.  Pt responded well today to initiation of manual work on right side with comfort from tg soft. Will continue to use precautions as this is previously radiated skin and she continues to have redness here. She will see surgeon tomorrow.    Stability/Clinical Decision Making Stable/Uncomplicated    Rehab Potential Excellent    PT Frequency 2x / week    PT Duration 6 weeks    PT Treatment/Interventions ADLs/Self Care Home Management;Therapeutic exercise;Patient/family education;Passive range of motion;Manual techniques;Dry needling;Scar mobilization;Manual lymph drainage    PT Next Visit  Plan check redness at right chest how was tg soft, does she need a small size? ,MFR techniques to right UE cording, PROM, STM prn, MLD to areas of swelling especially on right    PT Home Exercise Plan 4 post op exercises, NTS, supine dowel exercise    Consulted and Agree with Plan of Care Patient;Family member/caregiver             Patient will benefit from skilled therapeutic intervention in order to improve the following deficits and impairments:  Postural dysfunction, Decreased range of motion, Decreased knowledge of precautions, Impaired UE functional use, Pain, Decreased skin integrity, Increased edema, Impaired sensation, Decreased scar mobility  Visit Diagnosis: Abnormal posture  Chronic right shoulder pain  Localized edema  Stiffness of right shoulder, not elsewhere classified  Stiffness of left shoulder, not elsewhere classified  Malignant neoplasm of upper-outer quadrant of right breast in female, estrogen receptor positive [facility])     Problem List Patient Active Problem List   Diagnosis Date Noted   S/P bilateral mastectomy 02/26/2021   Genetic testing 02/18/2021   Family history of breast cancer 02/12/2021   Personal history of breast cancer 02/12/2021   Family history  of prostate cancer 02/12/2021   Malignant neoplasm of upper-outer quadrant of right breast in female, estrogen receptor positive ([place]) 02/04/2021   CVA (cerebral vascular accident) ([place]) 11/29/2019   Depression 11/29/2019   CKD (chronic kidney disease) stage 3, GFR 30-59 ml/min ([facility]) 11/29/2019   Nausea and vomiting 03/15/2019   Gastroesophageal reflux disease 03/15/2019   Loose stools 03/15/2019   Elevated alkaline phosphatase level 03/15/2019   Herpes progenitalis    Osteopenia    Hypothyroidism    IC (interstitial cystitis)    DUCTAL CARCINOMA IN SITU, RIGHT BREAST 03/12/2008   DIVERTICULOSIS OF COLON 03/12/2008   Donato Heinz. Owens Shark PT  Norwood Levo, PT 04/02/2021, 1:12 PM  [place] @ [place], [place], [address] Phone: [phone]   Fax:  [phone]  Name: Jaclyn Lopez MRN: [id number] Date of Birth: 06-17-1950

## 2021-04-07 ENCOUNTER — Other Ambulatory Visit: Payer: Self-pay

## 2021-04-07 ENCOUNTER — Ambulatory Visit: Payer: Medicare PPO | Admitting: Rehabilitation

## 2021-04-07 ENCOUNTER — Encounter: Payer: Self-pay | Admitting: Rehabilitation

## 2021-04-07 DIAGNOSIS — M25612 Stiffness of left shoulder, not elsewhere classified: Secondary | ICD-10-CM

## 2021-04-07 DIAGNOSIS — C50411 Malignant neoplasm of upper-outer quadrant of right female breast: Secondary | ICD-10-CM

## 2021-04-07 DIAGNOSIS — Z170 Estrogen receptor positive status [ER+]: Secondary | ICD-10-CM | POA: Diagnosis not present

## 2021-04-07 DIAGNOSIS — R293 Abnormal posture: Secondary | ICD-10-CM | POA: Diagnosis not present

## 2021-04-07 DIAGNOSIS — M25611 Stiffness of right shoulder, not elsewhere classified: Secondary | ICD-10-CM

## 2021-04-07 DIAGNOSIS — G8929 Other chronic pain: Secondary | ICD-10-CM | POA: Diagnosis not present

## 2021-04-07 DIAGNOSIS — R600 Localized edema: Secondary | ICD-10-CM

## 2021-04-07 DIAGNOSIS — M25511 Pain in right shoulder: Secondary | ICD-10-CM | POA: Diagnosis not present

## 2021-04-07 NOTE — Therapy (Signed)
[facility] @ [place], [place], [address] Phone: [phone]   Fax:  [phone]  Physical Therapy Treatment  Patient Details  Name: Jaclyn Lopez MRN: [id number] Date of Birth: 1950-10-05 Referring Provider (PT): Dr. Rolm Bookbinder   Encounter Date: 04/07/2021   PT End of Session - 04/07/21 0845     Visit Number 5    Number of Visits 15    PT Start Time 0800    PT Stop Time 0845    PT Time Calculation (min) 45 min    Activity Tolerance Patient tolerated treatment well    Behavior During Therapy [facility] for tasks assessed/performed             Past Medical History:  Diagnosis Date   Atypical glandular cells on Pap smear 2003   Cancer [facility]) 2005   Breast-Ductal CIS Right breast-Radiation - no lymph nodes removed per pt   CKD (chronic kidney disease)    CVA (cerebral vascular accident) ([place])    Depression    Family history of breast cancer 02/12/2021   Family history of prostate cancer 02/12/2021   GERD (gastroesophageal reflux disease)    Herpes progenitalis    History of COVID-19 10/15/2020   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 Subsequent LEEP showed CIN-2 with clear margins and negative ECC   Osteopenia 11/2018   T score -1.5 FRAX 9% / 1% improved at both hips stable at spine   Personal history of breast cancer 02/12/2021   PFO (patent foramen ovale)    PONV (postoperative nausea and vomiting)    Stroke ([place]) 11/2019   Thyroid disease    Hyperthyroid    Past Surgical History:  Procedure Laterality Date   Bladder stretch and Bx  1990   BREAST LUMPECTOMY  2005   right; cancer   BREAST LUMPECTOMY  2006   left; pre cancer    BREAST SURGERY     Reduction   BUNIONECTOMY  2007, 2011   CATARACT EXTRACTION  06/17/2020   CERVICAL CONE BIOPSY  2003   CESAREAN SECTION  1983   DILATION AND CURETTAGE OF UTERUS  2009   ENDOMETRIAL ABLATION     Novasure   GANGLION CYST EXCISION Left 1985   HYSTEROSCOPY  2009   LEEP  05/2015   CIN-2 with clear margins   MASTECTOMY W/ SENTINEL NODE BIOPSY Right 02/26/2021   Procedure: RIGHT MASTECTOMY WITH AXILLARY SENTINEL LYMPH NODE BIOPSY;  Surgeon: Rolm Bookbinder, MD;  Location: [place];  Service: General;  Laterality: Right;   MASTECTOMY, PARTIAL     MOUTH SURGERY  2012   implants   TOTAL MASTECTOMY Left 02/26/2021   Procedure: LEFT TOTAL MASTECTOMY;  Surgeon: Rolm Bookbinder, MD;  Location: [place];  Service: General;  Laterality: Left;   TUBAL LIGATION  1984    There were no vitals filed for this visit.   Subjective Assessment - 04/07/21 0801     Subjective Nothing new.    Pertinent History Patient was diagnosed on 01/07/2021 with right grade II invasive ductal carcinoma breast cancer. It measures 1 cm with calcifications in the upper outer quadrant. It is ER/PR positive and  HER2 negative with a Ki67 of 20%. She has a previous history of right breast DCIS with a lumpectomy and radiation in 2005. Bilateral mastectomies were performed on 02/26/2021 with 0/2 LN's on right and 0/1 LN on left.    Currently in Pain? No/denies                               [facility] Adult PT Treatment/Exercise - 04/07/21 0001       Shoulder Exercises: Supine   Flexion AAROM;Both;5 reps    Flexion Limitations with dowel      Shoulder Exercises: Pulleys   Flexion 2 minutes    Scaption 2 minutes      Manual Therapy   Manual Therapy Soft tissue mobilization    Soft tissue mobilization to bil pectoralis and latissimus gentle pressure in neutral and overhead flexion    Manual Lymphatic Drainage (MLD) briefly to right lateral trunk,  anterior chest and axilla with softening of tissue    Passive ROM to bil shoulders into flexion, abduction, and ER                       PT Short Term Goals - 02/18/21 0854       PT SHORT TERM GOAL #1   Title Patient will be independent with a home exercise program.    Status On-going               PT Long Term Goals - 03/23/21 1227       PT LONG TERM GOAL #1   Title Patient will demonstrate she has regained full shoulder ROM and function post operatively compared to baselines.    Time 6    Period Weeks    Status On-going    Target Date 05/04/21      PT LONG TERM GOAL #2   Title Pt will have decreased pain/discomfort from cording on right by 75%    Time 6    Period Weeks    Status New    Target Date 05/04/21      PT LONG TERM GOAL #3   Title Pt will attend ABC class    Time 6    Period Weeks    Status New    Target Date 05/04/21      PT LONG TERM GOAL #4   Title Pts quick dash will improve to no greater than 25%    Baseline 56%    Time 6    Period Weeks    Target Date 05/04/21                   Plan - 04/07/21 0845     Clinical Impression Statement Incision looks well except for more fragile on the Rt side due to radiation history.  Continued POC with AAROM and MT to improve mobility.  pt still feels a cord like pull in the Rt UE but cords are not palpated in the arm.    PT Frequency 2x / week    PT Duration 6 weeks    PT Treatment/Interventions ADLs/Self Care Home Management;Therapeutic exercise;Patient/family education;Passive range of motion;Manual techniques;Dry needling;Scar mobilization;Manual lymph drainage    PT Next Visit Plan MT to right UE cording pull locations, PROM, STM prn, AAROM             Patient will benefit from skilled therapeutic intervention in order to improve the following deficits and impairments:  Visit Diagnosis: Abnormal posture  Chronic right shoulder pain  Localized edema  Stiffness of  right shoulder, not elsewhere classified  Stiffness of left shoulder, not elsewhere classified  Malignant neoplasm of upper-outer quadrant of right breast in female, estrogen receptor positive [facility])     Problem List Patient Active Problem List   Diagnosis Date Noted   S/P bilateral mastectomy 02/26/2021   Genetic testing 02/18/2021   Family history of breast cancer 02/12/2021   Personal history of breast cancer 02/12/2021   Family history of prostate cancer 02/12/2021   Malignant neoplasm of upper-outer quadrant of right breast in female, estrogen receptor positive ([place]) 02/04/2021   CVA (cerebral vascular accident) ([place]) 11/29/2019   Depression 11/29/2019   CKD (chronic kidney disease) stage 3, GFR 30-59 ml/min (HCC) 11/29/2019   Nausea and vomiting 03/15/2019   Gastroesophageal reflux disease 03/15/2019   Loose stools 03/15/2019   Elevated alkaline phosphatase level 03/15/2019   Herpes progenitalis    Osteopenia    Hypothyroidism    IC (interstitial cystitis)    DUCTAL CARCINOMA IN SITU, RIGHT BREAST 03/12/2008   DIVERTICULOSIS OF COLON 03/12/2008    Stark Bray, PT 04/07/2021, 8:48 AM  [facility] @ [place], [place], [address] Phone: [phone]   Fax:  [phone]  Name: Jaclyn Lopez MRN: [id number] Date of Birth: 08-Sep-1950

## 2021-04-14 ENCOUNTER — Other Ambulatory Visit: Payer: Self-pay

## 2021-04-14 ENCOUNTER — Ambulatory Visit: Payer: Medicare PPO

## 2021-04-14 DIAGNOSIS — R293 Abnormal posture: Secondary | ICD-10-CM

## 2021-04-14 DIAGNOSIS — M25612 Stiffness of left shoulder, not elsewhere classified: Secondary | ICD-10-CM

## 2021-04-14 DIAGNOSIS — G8929 Other chronic pain: Secondary | ICD-10-CM

## 2021-04-14 DIAGNOSIS — R600 Localized edema: Secondary | ICD-10-CM

## 2021-04-14 DIAGNOSIS — M25511 Pain in right shoulder: Secondary | ICD-10-CM | POA: Diagnosis not present

## 2021-04-14 DIAGNOSIS — Z170 Estrogen receptor positive status [ER+]: Secondary | ICD-10-CM

## 2021-04-14 DIAGNOSIS — M25611 Stiffness of right shoulder, not elsewhere classified: Secondary | ICD-10-CM | POA: Diagnosis not present

## 2021-04-14 DIAGNOSIS — C50411 Malignant neoplasm of upper-outer quadrant of right female breast: Secondary | ICD-10-CM | POA: Diagnosis not present

## 2021-04-14 NOTE — Therapy (Addendum)
[facility] @ [place], [place], [address] Phone: [phone]   Fax:  [phone]  Physical Therapy Treatment  Patient Details  Name: Jaclyn Lopez MRN: [id number] Date of Birth: 1951-03-21 Referring Provider (PT): Dr. Rolm Bookbinder   Encounter Date: 04/14/2021   PT End of Session - 04/14/21 1112     Visit Number 6    Number of Visits 15    Date for PT Re-Evaluation 05/04/21    Authorization Type [employer]    Authorization - Visit Number 15    PT Start Time 1004    PT Stop Time 1101    PT Time Calculation (min) 57 min    Activity Tolerance Patient tolerated treatment well    Behavior During Therapy [facility] for tasks assessed/performed             Past Medical History:  Diagnosis Date   Atypical glandular cells on Pap smear 2003   Cancer [facility]) 2005   Breast-Ductal CIS Right breast-Radiation - no lymph nodes removed per pt   CKD (chronic kidney disease)    CVA (cerebral vascular accident) ([place])    Depression    Family history of breast cancer 02/12/2021   Family history of prostate cancer 02/12/2021   GERD (gastroesophageal reflux disease)    Herpes progenitalis    History of COVID-19 10/15/2020   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 Subsequent LEEP showed CIN-2 with clear margins and negative ECC   Osteopenia 11/2018   T score -1.5 FRAX 9% / 1% improved at both hips stable at spine   Personal history of breast cancer 02/12/2021   PFO (patent foramen ovale)    PONV (postoperative nausea and vomiting)    Stroke ([place]) 11/2019   Thyroid disease    Hyperthyroid    Past Surgical History:  Procedure Laterality Date   Bladder stretch  and Bx  1990   BREAST LUMPECTOMY  2005   right; cancer   BREAST LUMPECTOMY  2006   left; pre cancer   BREAST SURGERY     Reduction   BUNIONECTOMY  2007, 2011   CATARACT EXTRACTION  06/17/2020   CERVICAL CONE BIOPSY  2003   CESAREAN SECTION  1983   DILATION AND CURETTAGE OF UTERUS  2009   ENDOMETRIAL ABLATION     Novasure   GANGLION CYST EXCISION Left 1985   HYSTEROSCOPY  2009   LEEP  05/2015   CIN-2 with clear margins   MASTECTOMY W/ SENTINEL NODE BIOPSY Right 02/26/2021   Procedure: RIGHT MASTECTOMY WITH AXILLARY SENTINEL LYMPH NODE BIOPSY;  Surgeon: Rolm Bookbinder, MD;  Location: [facility];  Service: General;  Laterality: Right;   MASTECTOMY, PARTIAL     MOUTH SURGERY  2012   implants   TOTAL MASTECTOMY Left 02/26/2021   Procedure: LEFT TOTAL MASTECTOMY;  Surgeon: Rolm Bookbinder, MD;  Location: [facility];  Service: General;  Laterality: Left;   TUBAL LIGATION  1984    There were no vitals filed for this visit.   Subjective Assessment - 04/14/21 1008     Subjective I can feel the cording more now than last time into my Rt upper arm  but now into my forearm too. It just feels so tight. The end of the incision has healed more since last week and some of the scab came off so that is doing better. I'm also using Vit[employer] E oil on it now as well. My Lt shoulder is doing great, it feels about back to normal.    Pertinent History Patient was diagnosed on 01/07/2021 with right grade II invasive ductal carcinoma breast cancer. It measures 1 cm with calcifications in the upper outer quadrant. It is ER/PR positive and HER2 negative with a Ki67 of 20%. She has a previous history of right breast DCIS with a lumpectomy and radiation in 2005. Bilateral mastectomies were performed on 02/26/2021 with 0/2 LN's on right and 0/1 LN on left.    Patient Stated Goals Post op assessment/ Be able to reach better    Currently in Pain? No/denies                                [facility] Adult PT Treatment/Exercise - 04/14/21 0001       Manual Therapy   Manual Therapy Soft tissue mobilization;Scapular mobilization;Myofascial release;Passive ROM    Soft tissue mobilization In Supine to Rt upper arm into forearm where cording palpable today    Myofascial Release In Supine to Rt axilla where cording palpable and visible, blocking at lateral aspect of mastectomy incision where still healing to prevent pull here; also down into upper arm to forearm where pt reports feeling cording pulling    Scapular Mobilization In Lt S/L for scapular retraction and protraction; also scapular depression throughout P/ROM    Manual Lymphatic Drainage (MLD) --    Passive ROM To Rt shoulder in supine into flexion, abduction and D2 to pts tolerance                       PT Short Term Goals - 02/18/21 0854       PT SHORT TERM GOAL #1   Title Patient will be independent with a home exercise program.    Status On-going               PT Long Term Goals - 03/23/21 1227       PT LONG TERM GOAL #1   Title Patient will demonstrate she has regained full shoulder ROM and function post operatively compared to baselines.    Time 6    Period Weeks    Status On-going    Target Date 05/04/21      PT LONG TERM GOAL #2   Title Pt will have decreased pain/discomfort from cording on right by 75%    Time 6    Period Weeks    Status New    Target Date 05/04/21      PT LONG TERM GOAL #3   Title Pt will attend ABC class    Time 6    Period Weeks    Status New    Target Date 05/04/21      PT LONG TERM GOAL #4   Title Pts quick dash will improve to no greater than 25%    Baseline 56%    Time 6    Period Weeks    Target Date 05/04/21                   Plan - 04/14/21 1112     Clinical Impression Statement Continued with  manual therapy to Rt upper quadrant only today as pt reports her Lt side feeling great and wanted focus  on Rt where cording is feeling worse. Cording was visible and palpable at axilla into upper arm today and pt reports feeling pull into forearm as well. Focused on MFR to UE while blocking lateral aspect of mastectom incision from pulling. Also continued with end P/ROM stretching which did improve some by end of session. Educated pt about importance of incoporating end ROM stretching into her day to help decrease myofascial tightness which will help to resolve cording. Pt verbalized good understanding of this.    Stability/Clinical Decision Making Stable/Uncomplicated    Rehab Potential Excellent    PT Frequency 2x / week    PT Duration 6 weeks    PT Treatment/Interventions ADLs/Self Care Home Management;Therapeutic exercise;Patient/family education;Passive range of motion;Manual techniques;Dry needling;Scar mobilization;Manual lymph drainage    PT Next Visit Plan MT to right UE cording from axilla to near forearm while blocking pull at lateral aspect of mastectomy incision where still healing, PROM, STM prn, AAROM    PT Home Exercise Plan 4 post op exercises, NTS, supine dowel exercise    Consulted and Agree with Plan of Care Patient             Patient will benefit from skilled therapeutic intervention in order to improve the following deficits and impairments:  Postural dysfunction, Decreased range of motion, Decreased knowledge of precautions, Impaired UE functional use, Pain, Decreased skin integrity, Increased edema, Impaired sensation, Decreased scar mobility  Visit Diagnosis: Abnormal posture  Chronic right shoulder pain  Localized edema  Stiffness of right shoulder, not elsewhere classified  Stiffness of left shoulder, not elsewhere classified  Malignant neoplasm of upper-outer quadrant of right breast in female, estrogen receptor positive ([place])     Problem List Patient Active Problem List   Diagnosis Date Noted   S/P bilateral mastectomy 02/26/2021   Genetic testing  02/18/2021   Family history of breast cancer 02/12/2021   Personal history of breast cancer 02/12/2021   Family history of prostate cancer 02/12/2021   Malignant neoplasm of upper-outer quadrant of right breast in female, estrogen receptor positive ([place]) 02/04/2021   CVA (cerebral vascular accident) ([place]) 11/29/2019   Depression 11/29/2019   CKD (chronic kidney disease) stage 3, GFR 30-59 ml/min (HCC) 11/29/2019   Nausea and vomiting 03/15/2019   Gastroesophageal reflux disease 03/15/2019   Loose stools 03/15/2019   Elevated alkaline phosphatase level 03/15/2019   Herpes progenitalis    Osteopenia    Hypothyroidism    IC (interstitial cystitis)    DUCTAL CARCINOMA IN SITU, RIGHT BREAST 03/12/2008   DIVERTICULOSIS OF COLON 03/12/2008    Otelia Limes, PTA 04/14/2021, 11:22 AM  [facility] @ [place], [place], [address] Phone: [phone]   Fax:  [phone]  Name: RILDA BULLS MRN: [id number] Date of Birth: Aug 15, 1950

## 2021-04-16 ENCOUNTER — Encounter: Payer: Self-pay | Admitting: Physical Therapy

## 2021-04-16 ENCOUNTER — Other Ambulatory Visit: Payer: Self-pay

## 2021-04-16 ENCOUNTER — Ambulatory Visit: Payer: Medicare PPO | Attending: General Surgery | Admitting: Physical Therapy

## 2021-04-16 DIAGNOSIS — R600 Localized edema: Secondary | ICD-10-CM

## 2021-04-16 DIAGNOSIS — M25511 Pain in right shoulder: Secondary | ICD-10-CM | POA: Insufficient documentation

## 2021-04-16 DIAGNOSIS — C50411 Malignant neoplasm of upper-outer quadrant of right female breast: Secondary | ICD-10-CM | POA: Diagnosis not present

## 2021-04-16 DIAGNOSIS — R293 Abnormal posture: Secondary | ICD-10-CM

## 2021-04-16 DIAGNOSIS — G8929 Other chronic pain: Secondary | ICD-10-CM

## 2021-04-16 DIAGNOSIS — Z170 Estrogen receptor positive status [ER+]: Secondary | ICD-10-CM | POA: Insufficient documentation

## 2021-04-16 DIAGNOSIS — M25612 Stiffness of left shoulder, not elsewhere classified: Secondary | ICD-10-CM | POA: Diagnosis not present

## 2021-04-16 DIAGNOSIS — M25611 Stiffness of right shoulder, not elsewhere classified: Secondary | ICD-10-CM

## 2021-04-16 NOTE — Therapy (Signed)
[facility] @ [place], [place], [address] Phone: [phone]   Fax:  [phone]  Physical Therapy Treatment  Patient Details  Name: Jaclyn Lopez MRN: [id number] Date of Birth: 01-Jan-1951 Referring Provider (PT): Dr. Rolm Bookbinder   Encounter Date: 04/16/2021   PT End of Session - 04/16/21 1310     Visit Number 7    Number of Visits 15    Date for PT Re-Evaluation 05/04/21    PT Start Time 1000    PT Stop Time 1638    PT Time Calculation (min) 45 min    Activity Tolerance Patient tolerated treatment well    Behavior During Therapy [facility] for tasks assessed/performed             Past Medical History:  Diagnosis Date   Atypical glandular cells on Pap smear 2003   Cancer [facility]) 2005   Breast-Ductal CIS Right breast-Radiation - no lymph nodes removed per pt   CKD (chronic kidney disease)    CVA (cerebral vascular accident) ([place])    Depression    Family history of breast cancer 02/12/2021   Family history of prostate cancer 02/12/2021   GERD (gastroesophageal reflux disease)    Herpes progenitalis    History of COVID-19 10/15/2020   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 Subsequent LEEP showed CIN-2 with clear margins and negative ECC   Osteopenia 11/2018   T score -1.5 FRAX 9% / 1% improved at both hips stable at spine   Personal history of breast cancer 02/12/2021   PFO (patent foramen ovale)    PONV (postoperative nausea and vomiting)    Stroke ([place]) 11/2019   Thyroid disease    Hyperthyroid    Past Surgical History:  Procedure Laterality Date   Bladder stretch and Bx  1990   BREAST LUMPECTOMY  2005   right; cancer   BREAST  LUMPECTOMY  2006   left; pre cancer   BREAST SURGERY     Reduction   BUNIONECTOMY  2007, 2011   CATARACT EXTRACTION  06/17/2020   CERVICAL CONE BIOPSY  2003   CESAREAN SECTION  1983   DILATION AND CURETTAGE OF UTERUS  2009   ENDOMETRIAL ABLATION     Novasure   GANGLION CYST EXCISION Left 1985   HYSTEROSCOPY  2009   LEEP  05/2015   CIN-2 with clear margins   MASTECTOMY W/ SENTINEL NODE BIOPSY Right 02/26/2021   Procedure: RIGHT MASTECTOMY WITH AXILLARY SENTINEL LYMPH NODE BIOPSY;  Surgeon: Rolm Bookbinder, MD;  Location: [facility] [facility];  Service: General;  Laterality: Right;   MASTECTOMY, PARTIAL     MOUTH SURGERY  2012   implants   TOTAL MASTECTOMY Left 02/26/2021   Procedure: LEFT TOTAL MASTECTOMY;  Surgeon: Rolm Bookbinder, MD;  Location: [facility];  Service: General;  Laterality: Left;   TUBAL LIGATION  1984    There were no vitals filed for this visit.   Subjective Assessment - 04/16/21 1302     Subjective Pt reports she is still having cording pain down into her forearm especially when she reaches overhead .  She has been using Vitamin E oil on her chest  avoiding the scab on her right chest.  The TG soft compression sleeve actually made her forearm pain worse    Pertinent History Patient was diagnosed on 01/07/2021 with right grade II invasive ductal carcinoma breast cancer. It measures 1 cm with calcifications in the upper outer quadrant. It is ER/PR positive and HER2 negative with a Ki67 of 20%. She has a previous history of right breast DCIS with a lumpectomy and radiation in 2005. Bilateral mastectomies were performed on 02/26/2021 with 0/2 LN's on right and 0/1 LN on left.    Patient Stated Goals Post op assessment/ Be able to reach better    Currently in Pain? Yes   with reaching  right  arm   Pain Score 8     Pain Location Arm    Pain Orientation Right    Pain Descriptors / Indicators Sharp    Pain Type Chronic pain    Pain Radiating  Towards into forearm    Pain Onset 1 to 4 weeks ago    Pain Frequency Intermittent                   LYMPHEDEMA/ONCOLOGY QUESTIONNAIRE - 04/16/21 0001       Lymphedema Assessments   Lymphedema Assessments Upper extremities      Right Upper Extremity Lymphedema   10 cm Proximal to Olecranon Process 27.8 cm    Olecranon Process 24 cm    10 cm Proximal to Ulnar Styloid Process 20 cm    Just Proximal to Ulnar Styloid Process 14.4 cm    At Base of 2nd Digit 6 cm                        [facility] Adult PT Treatment/Exercise - 04/16/21 0001       Modalities   Modalities Moist Heat      Moist Heat Therapy   Number Minutes Moist Heat 10 Minutes    Moist Heat Location --   to right arm around elbow at area of painful cording     Manual Therapy   Manual Therapy Manual Lymphatic Drainage (MLD);Myofascial release;Passive ROM    Soft tissue mobilization In Supine to Rt upper arm into forearm where cording palpable today    Myofascial Release gently to right chest being careful not to stretch scabbed area    Scapular Mobilization to right chest, upper arm , elbow and forearm    Passive ROM to right shoulder with elbow and wrist bent, to elbow with shoulder at side                     PT Education - 04/16/21 1310     Education Details apply vitamin E oil to both sides of chest with gentle scar massage avoiding disturbing scabbed areas              PT Short Term Goals - 02/18/21 0854       PT SHORT TERM GOAL #1   Title Patient will be independent with a home exercise program.    Status On-going               PT Long Term Goals - 03/23/21 1227       PT LONG TERM GOAL #1   Title Patient will demonstrate she has regained full shoulder ROM and function post operatively compared to baselines.    Time 6    Period Weeks    Status On-going  Target Date 05/04/21      PT LONG TERM GOAL #2   Title Pt will have decreased pain/discomfort from  cording on right by 75%    Time 6    Period Weeks    Status New    Target Date 05/04/21      PT LONG TERM GOAL #3   Title Pt will attend ABC class    Time 6    Period Weeks    Status New    Target Date 05/04/21      PT LONG TERM GOAL #4   Title Pts quick dash will improve to no greater than 25%    Baseline 56%    Time 6    Period Weeks    Target Date 05/04/21                   Plan - 04/16/21 1311     Clinical Impression Statement Pt report she felt much looser with no pain at end of sessionl  Used a few minutes of gentle moist heat to cord area to decrease pain and inflammation prior to MLD and soft tissue work.  Pt appears to be making slow steady gains    Stability/Clinical Decision Making Stable/Uncomplicated    Rehab Potential Excellent    PT Frequency 2x / week    PT Duration 6 weeks    PT Treatment/Interventions ADLs/Self Care Home Management;Therapeutic exercise;Patient/family education;Passive range of motion;Manual techniques;Dry needling;Scar mobilization;Manual lymph drainage    PT Next Visit Plan MT to right UE cording from axilla to near forearm while blocking pull at lateral aspect of mastectomy incision where still healing, PROM, STM prn, AAROM    Consulted and Agree with Plan of Care Patient             Patient will benefit from skilled therapeutic intervention in order to improve the following deficits and impairments:  Postural dysfunction, Decreased range of motion, Decreased knowledge of precautions, Impaired UE functional use, Pain, Decreased skin integrity, Increased edema, Impaired sensation, Decreased scar mobility  Visit Diagnosis: Abnormal posture  Chronic right shoulder pain  Localized edema  Stiffness of right shoulder, not elsewhere classified     Problem List Patient Active Problem List   Diagnosis Date Noted   S/P bilateral mastectomy 02/26/2021   Genetic testing 02/18/2021   Family history of breast cancer 02/12/2021    Personal history of breast cancer 02/12/2021   Family history of prostate cancer 02/12/2021   Malignant neoplasm of upper-outer quadrant of right breast in female, estrogen receptor positive ([place]) 02/04/2021   CVA (cerebral vascular accident) ([place]) 11/29/2019   Depression 11/29/2019   CKD (chronic kidney disease) stage 3, GFR 30-59 ml/min (HCC) 11/29/2019   Nausea and vomiting 03/15/2019   Gastroesophageal reflux disease 03/15/2019   Loose stools 03/15/2019   Elevated alkaline phosphatase level 03/15/2019   Herpes progenitalis    Osteopenia    Hypothyroidism    IC (interstitial cystitis)    DUCTAL CARCINOMA IN SITU, RIGHT BREAST 03/12/2008   DIVERTICULOSIS OF COLON 03/12/2008   Donato Heinz. Owens Shark PT  Norwood Levo, PT 04/16/2021, 1:13 PM  [facility] @ [place], [place], [address] Phone: [phone]   Fax:  [phone]  Name: Jaclyn Lopez MRN: [id number] Date of Birth: [birth year]

## 2021-04-21 ENCOUNTER — Other Ambulatory Visit: Payer: Self-pay

## 2021-04-21 ENCOUNTER — Ambulatory Visit: Payer: Medicare PPO | Admitting: Rehabilitation

## 2021-04-21 ENCOUNTER — Encounter: Payer: Self-pay | Admitting: Rehabilitation

## 2021-04-21 DIAGNOSIS — M25612 Stiffness of left shoulder, not elsewhere classified: Secondary | ICD-10-CM | POA: Diagnosis not present

## 2021-04-21 DIAGNOSIS — R293 Abnormal posture: Secondary | ICD-10-CM | POA: Diagnosis not present

## 2021-04-21 DIAGNOSIS — M25611 Stiffness of right shoulder, not elsewhere classified: Secondary | ICD-10-CM | POA: Diagnosis not present

## 2021-04-21 DIAGNOSIS — C50411 Malignant neoplasm of upper-outer quadrant of right female breast: Secondary | ICD-10-CM

## 2021-04-21 DIAGNOSIS — Z170 Estrogen receptor positive status [ER+]: Secondary | ICD-10-CM

## 2021-04-21 DIAGNOSIS — R600 Localized edema: Secondary | ICD-10-CM | POA: Diagnosis not present

## 2021-04-21 DIAGNOSIS — M25511 Pain in right shoulder: Secondary | ICD-10-CM

## 2021-04-21 DIAGNOSIS — G8929 Other chronic pain: Secondary | ICD-10-CM

## 2021-04-21 NOTE — Therapy (Signed)
[facility] @ [place], [place], [address] Phone: [phone]   Fax:  [phone]  Physical Therapy Treatment  Patient Details  Name: Jaclyn Lopez MRN: [id number] Date of Birth: 10/27/1950 Referring Provider (PT): Dr. Rolm Bookbinder   Encounter Date: 04/21/2021   PT End of Session - 04/21/21 1058     Visit Number 8    Number of Visits 15    Date for PT Re-Evaluation 05/04/21    PT Start Time 1000    PT Stop Time 3267    PT Time Calculation (min) 58 min    Activity Tolerance Patient tolerated treatment well    Behavior During Therapy [facility] for tasks assessed/performed             Past Medical History:  Diagnosis Date   Atypical glandular cells on Pap smear 2003   Cancer [facility]) 2005   Breast-Ductal CIS Right breast-Radiation - no lymph nodes removed per pt   CKD (chronic kidney disease)    CVA (cerebral vascular accident) ([place])    Depression    Family history of breast cancer 02/12/2021   Family history of prostate cancer 02/12/2021   GERD (gastroesophageal reflux disease)    Herpes progenitalis    History of COVID-19 10/15/2020   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 Subsequent LEEP showed CIN-2 with clear margins and negative ECC   Osteopenia 11/2018   T score -1.5 FRAX 9% / 1% improved at both hips stable at spine   Personal history of breast cancer 02/12/2021   PFO (patent foramen ovale)    PONV (postoperative nausea and vomiting)    Stroke ([place]) 11/2019   Thyroid disease    Hyperthyroid    Past Surgical History:  Procedure Laterality Date   Bladder stretch and Bx  1990   BREAST LUMPECTOMY  2005   right; cancer   BREAST  LUMPECTOMY  2006   left; pre cancer   BREAST SURGERY     Reduction   BUNIONECTOMY  2007, 2011   CATARACT EXTRACTION  06/17/2020   CERVICAL CONE BIOPSY  2003   CESAREAN SECTION  1983   DILATION AND CURETTAGE OF UTERUS  2009   ENDOMETRIAL ABLATION     Novasure   GANGLION CYST EXCISION Left 1985   HYSTEROSCOPY  2009   LEEP  05/2015   CIN-2 with clear margins   MASTECTOMY W/ SENTINEL NODE BIOPSY Right 02/26/2021   Procedure: RIGHT MASTECTOMY WITH AXILLARY SENTINEL LYMPH NODE BIOPSY;  Surgeon: Rolm Bookbinder, MD;  Location: [place];  Service: General;  Laterality: Right;   MASTECTOMY, PARTIAL     MOUTH SURGERY  2012   implants   TOTAL MASTECTOMY Left 02/26/2021   Procedure: LEFT TOTAL MASTECTOMY;  Surgeon: Rolm Bookbinder, MD;  Location: [place];  Service: General;  Laterality: Left;   TUBAL LIGATION  1984    There were no vitals filed for this visit.   Subjective Assessment - 04/21/21 0959     Subjective I liked the heat last time.  It seemed to have helped    Pertinent History Patient was diagnosed on 01/07/2021 with right grade II invasive ductal carcinoma  breast cancer. It measures 1 cm with calcifications in the upper outer quadrant. It is ER/PR positive and HER2 negative with a Ki67 of 20%. She has a previous history of right breast DCIS with a lumpectomy and radiation in 2005. Bilateral mastectomies were performed on 02/26/2021 with 0/2 LN's on right and 0/1 LN on left.    Currently in Pain? No/denies   reaching - causes around 5/10 in severity                              [facility] Adult PT Treatment/Exercise - 04/21/21 0001       Moist Heat Therapy   Number Minutes Moist Heat 10 Minutes    Moist Heat Location --   to Rt arm at area of cording pain in medial upper arm and forearm/elbow - pt aware to remove if too hot and pt reports comfort throughout     Manual Therapy   Soft tissue mobilization In Supine to Rt  upper arm into forearm where cording palpable today    Myofascial Release gently to right chest being careful not to stretch scabbed area    Passive ROM to right shoulder with elbow and wrist bent, to elbow with shoulder at side                       PT Short Term Goals - 02/18/21 0854       PT SHORT TERM GOAL #1   Title Patient will be independent with a home exercise program.    Status On-going               PT Long Term Goals - 03/23/21 1227       PT LONG TERM GOAL #1   Title Patient will demonstrate she has regained full shoulder ROM and function post operatively compared to baselines.    Time 6    Period Weeks    Status On-going    Target Date 05/04/21      PT LONG TERM GOAL #2   Title Pt will have decreased pain/discomfort from cording on right by 75%    Time 6    Period Weeks    Status New    Target Date 05/04/21      PT LONG TERM GOAL #3   Title Pt will attend ABC class    Time 6    Period Weeks    Status New    Target Date 05/04/21      PT LONG TERM GOAL #4   Title Pts quick dash will improve to no greater than 25%    Baseline 56%    Time 6    Period Weeks    Target Date 05/04/21                   Plan - 04/21/21 1059     Clinical Impression Statement Pt is doing well.  Felt like the moist heat was helpful.  Reminded her not to use in axilla and on chest and for no more than 14mnutes at home.  Chest appears softer today compared to last visit with this PT.  Stillwith cording and anterior chest tightness    PT Frequency 2x / week    PT Duration 6 weeks    PT Treatment/Interventions ADLs/Self Care Home Management;Therapeutic exercise;Patient/family education;Passive range of motion;Manual techniques;Dry needling;Scar mobilization;Manual lymph drainage  Patient will benefit from skilled therapeutic intervention in order to improve the following deficits and impairments:     Visit Diagnosis: Abnormal  posture  Chronic right shoulder pain  Localized edema  Stiffness of right shoulder, not elsewhere classified  Stiffness of left shoulder, not elsewhere classified  Malignant neoplasm of upper-outer quadrant of right breast in female, estrogen receptor positive [facility])     Problem List Patient Active Problem List   Diagnosis Date Noted   S/P bilateral mastectomy 02/26/2021   Genetic testing 02/18/2021   Family history of breast cancer 02/12/2021   Personal history of breast cancer 02/12/2021   Family history of prostate cancer 02/12/2021   Malignant neoplasm of upper-outer quadrant of right breast in female, estrogen receptor positive ([place]) 02/04/2021   CVA (cerebral vascular accident) ([place]) 11/29/2019   Depression 11/29/2019   CKD (chronic kidney disease) stage 3, GFR 30-59 ml/min (HCC) 11/29/2019   Nausea and vomiting 03/15/2019   Gastroesophageal reflux disease 03/15/2019   Loose stools 03/15/2019   Elevated alkaline phosphatase level 03/15/2019   Herpes progenitalis    Osteopenia    Hypothyroidism    IC (interstitial cystitis)    DUCTAL CARCINOMA IN SITU, RIGHT BREAST 03/12/2008   DIVERTICULOSIS OF COLON 03/12/2008    Stark Bray, PT 04/21/2021, 11:06 AM  [facility] @ [place], [place], [address] Phone: [phone]   Fax:  [phone]  Name: Jaclyn Lopez MRN: [id number] Date of Birth: 1950-10-30

## 2021-04-23 ENCOUNTER — Ambulatory Visit: Payer: Medicare PPO | Admitting: Rehabilitation

## 2021-04-23 ENCOUNTER — Other Ambulatory Visit: Payer: Self-pay

## 2021-04-23 ENCOUNTER — Encounter: Payer: Self-pay | Admitting: Rehabilitation

## 2021-04-23 DIAGNOSIS — M25611 Stiffness of right shoulder, not elsewhere classified: Secondary | ICD-10-CM | POA: Diagnosis not present

## 2021-04-23 DIAGNOSIS — R293 Abnormal posture: Secondary | ICD-10-CM | POA: Diagnosis not present

## 2021-04-23 DIAGNOSIS — G8929 Other chronic pain: Secondary | ICD-10-CM

## 2021-04-23 DIAGNOSIS — C50411 Malignant neoplasm of upper-outer quadrant of right female breast: Secondary | ICD-10-CM

## 2021-04-23 DIAGNOSIS — M25511 Pain in right shoulder: Secondary | ICD-10-CM | POA: Diagnosis not present

## 2021-04-23 DIAGNOSIS — R600 Localized edema: Secondary | ICD-10-CM

## 2021-04-23 DIAGNOSIS — M25612 Stiffness of left shoulder, not elsewhere classified: Secondary | ICD-10-CM | POA: Diagnosis not present

## 2021-04-23 DIAGNOSIS — Z170 Estrogen receptor positive status [ER+]: Secondary | ICD-10-CM | POA: Diagnosis not present

## 2021-04-23 NOTE — Patient Instructions (Signed)
Access Code: [email or web address] URL: [email or web address]: 12/08/2022Prepared by: Marcene Brawn TevisExercises  Standing Pec Stretch at [employer] - 1 x daily - 7 x weekly - 1-3 sets - 10 reps - 20-30 seconds hold  Standing Median Nerve Glide - 1 x daily - 7 x weekly - 1-3 sets - 10 reps - 2-3 seconds hold  Supine Lower Trunk Rotation - 1 x daily - 7 x weekly - 1-3 sets - 10 reps - 5-6 seconds hold

## 2021-04-23 NOTE — Therapy (Signed)
[facility] @ [place], [place], [address] Phone: [phone]   Fax:  [phone]  Physical Therapy Treatment  Patient Details  Name: Jaclyn Lopez MRN: [id number] Date of Birth: January 08, 1951 Referring Provider (PT): Dr. Rolm Bookbinder   Encounter Date: 04/23/2021   PT End of Session - 04/23/21 1053     Visit Number 9    Number of Visits 15    Date for PT Re-Evaluation 05/04/21    PT Start Time 1000    PT Stop Time 1053    PT Time Calculation (min) 53 min    Activity Tolerance Patient tolerated treatment well    Behavior During Therapy [facility] for tasks assessed/performed             Past Medical History:  Diagnosis Date   Atypical glandular cells on Pap smear 2003   Cancer [facility]) 2005   Breast-Ductal CIS Right breast-Radiation - no lymph nodes removed per pt   CKD (chronic kidney disease)    CVA (cerebral vascular accident) ([place])    Depression    Family history of breast cancer 02/12/2021   Family history of prostate cancer 02/12/2021   GERD (gastroesophageal reflux disease)    Herpes progenitalis    History of COVID-19 10/15/2020   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 Subsequent LEEP showed CIN-2 with clear margins and negative ECC   Osteopenia 11/2018   T score -1.5 FRAX 9% / 1% improved at both hips stable at spine   Personal history of breast cancer 02/12/2021   PFO (patent foramen ovale)    PONV (postoperative nausea and vomiting)    Stroke ([place]) 11/2019   Thyroid disease    Hyperthyroid    Past Surgical History:  Procedure Laterality Date   Bladder stretch and Bx  1990   BREAST LUMPECTOMY  2005   right; cancer   BREAST  LUMPECTOMY  2006   left; pre cancer   BREAST SURGERY     Reduction   BUNIONECTOMY  2007, 2011   CATARACT EXTRACTION  06/17/2020   CERVICAL CONE BIOPSY  2003   CESAREAN SECTION  1983   DILATION AND CURETTAGE OF UTERUS  2009   ENDOMETRIAL ABLATION     Novasure   GANGLION CYST EXCISION Left 1985   HYSTEROSCOPY  2009   LEEP  05/2015   CIN-2 with clear margins   MASTECTOMY W/ SENTINEL NODE BIOPSY Right 02/26/2021   Procedure: RIGHT MASTECTOMY WITH AXILLARY SENTINEL LYMPH NODE BIOPSY;  Surgeon: Rolm Bookbinder, MD;  Location: [place];  Service: General;  Laterality: Right;   MASTECTOMY, PARTIAL     MOUTH SURGERY  2012   implants   TOTAL MASTECTOMY Left 02/26/2021   Procedure: LEFT TOTAL MASTECTOMY;  Surgeon: Rolm Bookbinder, MD;  Location: [place];  Service: General;  Laterality: Left;   TUBAL LIGATION  1984    There were no vitals filed for this visit.   Subjective Assessment - 04/23/21 0955     Subjective nothing new.  Its feeling better    Pertinent History Patient was diagnosed on 01/07/2021 with right grade II invasive ductal carcinoma breast cancer. It measures 1 cm  with calcifications in the upper outer quadrant. It is ER/PR positive and HER2 negative with a Ki67 of 20%. She has a previous history of right breast DCIS with a lumpectomy and radiation in 2005. Bilateral mastectomies were performed on 02/26/2021 with 0/2 LN's on right and 0/1 LN on left.    Currently in Pain? No/denies                [facility] PT Assessment - 04/23/21 0001       AROM   Right Shoulder Flexion 147 Degrees   pull in the axilla   Right Shoulder ABduction 150 Degrees   pull in axila                          [facility] Adult PT Treatment/Exercise - 04/23/21 0001       Shoulder Exercises: Pulleys   Flexion 2 minutes    Scaption 2 minutes      Shoulder Exercises: ROM/Strengthening   Other ROM/Strengthening Exercises pro/ret both hands on  wall      Shoulder Exercises: Stretch   Other Shoulder Stretches XYW x 5    Other Shoulder Stretches single arm chest stretch 3x15"      Moist Heat Therapy   Number Minutes Moist Heat 4 Minutes    Moist Heat Location --   elbow region along cording     Manual Therapy   Soft tissue mobilization In Supine to Rt upper arm into forearm where cording palpable today    Myofascial Release gently to right chest being careful not to stretch scabbed area    Passive ROM to right shoulder with elbow and wrist bent, to elbow with shoulder at side                       PT Short Term Goals - 02/18/21 0854       PT SHORT TERM GOAL #1   Title Patient will be independent with a home exercise program.    Status On-going               PT Long Term Goals - 03/23/21 1227       PT LONG TERM GOAL #1   Title Patient will demonstrate she has regained full shoulder ROM and function post operatively compared to baselines.    Time 6    Period Weeks    Status On-going    Target Date 05/04/21      PT LONG TERM GOAL #2   Title Pt will have decreased pain/discomfort from cording on right by 75%    Time 6    Period Weeks    Status New    Target Date 05/04/21      PT LONG TERM GOAL #3   Title Pt will attend ABC class    Time 6    Period Weeks    Status New    Target Date 05/04/21      PT LONG TERM GOAL #4   Title Pts quick dash will improve to no greater than 25%    Baseline 56%    Time 6    Period Weeks    Target Date 05/04/21                   Plan - 04/23/21 1053     Clinical Impression Statement Pt doing well. AROM is a few degrees from baseline and limited mainly by cording down the arm  still almost to the wrist.  Pt noted she feels weakness in bil UEs so we will start some strengthening next session    PT Frequency 2x / week    PT Duration 6 weeks    PT Treatment/Interventions ADLs/Self Care Home Management;Therapeutic exercise;Patient/family  education;Passive range of motion;Manual techniques;Dry needling;Scar mobilization;Manual lymph drainage    PT Next Visit Plan MT to right UE cording,  PROM, STM prn, AAROM, try supine scap yellow    Consulted and Agree with Plan of Care Patient             Patient will benefit from skilled therapeutic intervention in order to improve the following deficits and impairments:     Visit Diagnosis: Abnormal posture  Chronic right shoulder pain  Localized edema  Stiffness of right shoulder, not elsewhere classified  Stiffness of left shoulder, not elsewhere classified  Malignant neoplasm of upper-outer quadrant of right breast in female, estrogen receptor positive ([place])     Problem List Patient Active Problem List   Diagnosis Date Noted   S/P bilateral mastectomy 02/26/2021   Genetic testing 02/18/2021   Family history of breast cancer 02/12/2021   Personal history of breast cancer 02/12/2021   Family history of prostate cancer 02/12/2021   Malignant neoplasm of upper-outer quadrant of right breast in female, estrogen receptor positive ([place]) 02/04/2021   CVA (cerebral vascular accident) ([place]) 11/29/2019   Depression 11/29/2019   CKD (chronic kidney disease) stage 3, GFR 30-59 ml/min (HCC) 11/29/2019   Nausea and vomiting 03/15/2019   Gastroesophageal reflux disease 03/15/2019   Loose stools 03/15/2019   Elevated alkaline phosphatase level 03/15/2019   Herpes progenitalis    Osteopenia    Hypothyroidism    IC (interstitial cystitis)    DUCTAL CARCINOMA IN SITU, RIGHT BREAST 03/12/2008   DIVERTICULOSIS OF COLON 03/12/2008    Stark Bray, PT 04/23/2021, 10:56 AM  [facility] @ [place], [place], [address] Phone: [phone]   Fax:  [phone]  Name: Jaclyn Lopez MRN: [id number] Date of Birth: 1951/04/23

## 2021-04-28 ENCOUNTER — Ambulatory Visit: Payer: Medicare PPO

## 2021-04-28 ENCOUNTER — Other Ambulatory Visit: Payer: Self-pay

## 2021-04-28 DIAGNOSIS — M25511 Pain in right shoulder: Secondary | ICD-10-CM | POA: Diagnosis not present

## 2021-04-28 DIAGNOSIS — R293 Abnormal posture: Secondary | ICD-10-CM | POA: Diagnosis not present

## 2021-04-28 DIAGNOSIS — C50411 Malignant neoplasm of upper-outer quadrant of right female breast: Secondary | ICD-10-CM | POA: Diagnosis not present

## 2021-04-28 DIAGNOSIS — Z170 Estrogen receptor positive status [ER+]: Secondary | ICD-10-CM | POA: Diagnosis not present

## 2021-04-28 DIAGNOSIS — R600 Localized edema: Secondary | ICD-10-CM | POA: Diagnosis not present

## 2021-04-28 DIAGNOSIS — G8929 Other chronic pain: Secondary | ICD-10-CM

## 2021-04-28 DIAGNOSIS — M25611 Stiffness of right shoulder, not elsewhere classified: Secondary | ICD-10-CM | POA: Diagnosis not present

## 2021-04-28 DIAGNOSIS — M25612 Stiffness of left shoulder, not elsewhere classified: Secondary | ICD-10-CM | POA: Diagnosis not present

## 2021-04-28 NOTE — Patient Instructions (Signed)
Over Head Pull: Narrow and Wide Grip   Cancer Rehab [phone]   On back, knees bent, feet flat, band across thighs, elbows straight but relaxed. Pull hands apart (start). Keeping elbows straight, bring arms up and over head, hands toward floor. Keep pull steady on band. Hold momentarily. Return slowly, keeping pull steady, back to start. Then do same with a wider grip on the band (past shoulder width) Repeat _5-10__ times. Band color __yellow____   Side Pull: Double Arm   On back, knees bent, feet flat. Arms perpendicular to body, shoulder level, elbows straight but relaxed. Pull arms out to sides, elbows straight. Resistance band comes across collarbones, hands toward floor. Hold momentarily. Slowly return to starting position. Repeat _5-10__ times. Band color _yellow____   [facility] and end with your thumb up  On back, knees bent, feet flat, left hand on left hip, right hand above left. Pull right arm DIAGONALLY (hip to shoulder) across chest. Bring right arm along head toward floor. Hold momentarily. Slowly return to starting position. Repeat _5-10__ times. Do with left arm. Band color _yellow_____   Shoulder Rotation: Double Arm Go half way about 45 degrees  On back, knees bent, feet flat, elbows tucked at sides, bent 90, hands palms up. Pull hands apart and down toward floor, keeping elbows near sides. Hold momentarily. Slowly return to starting position. Repeat _5-10__ times. Band color __yellow____

## 2021-04-28 NOTE — Therapy (Signed)
[facility] @ [facility], [place], [address] Phone: [phone]   Fax:  [phone]  Physical Therapy Treatment  Patient Details  Name: Jaclyn Lopez MRN: [id number] Date of Birth: 1951/03/05 Referring Provider (PT): Dr. Rolm Bookbinder   Encounter Date: 04/28/2021   PT End of Session - 04/28/21 1059     Visit Number 10    Number of Visits 15    Date for PT Re-Evaluation 05/04/21    Authorization Type [employer]    Authorization - Visit Number 15    PT Start Time 1004    PT Stop Time 3220    PT Time Calculation (min) 53 min    Activity Tolerance Patient tolerated treatment well    Behavior During Therapy [facility] for tasks assessed/performed             Past Medical History:  Diagnosis Date   Atypical glandular cells on Pap smear 2003   Cancer [facility]) 2005   Breast-Ductal CIS Right breast-Radiation - no lymph nodes removed per pt   CKD (chronic kidney disease)    CVA (cerebral vascular accident) ([place])    Depression    Family history of breast cancer 02/12/2021   Family history of prostate cancer 02/12/2021   GERD (gastroesophageal reflux disease)    Herpes progenitalis    History of COVID-19 10/15/2020   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 Subsequent LEEP showed CIN-2 with clear margins and negative ECC   Osteopenia 11/2018   T score -1.5 FRAX 9% / 1% improved at both hips stable at spine   Personal history of breast cancer 02/12/2021   PFO (patent foramen ovale)    PONV (postoperative nausea and vomiting)    Stroke ([place]) 11/2019   Thyroid disease    Hyperthyroid    Past Surgical History:  Procedure Laterality Date   Bladder stretch  and Bx  1990   BREAST LUMPECTOMY  2005   right; cancer   BREAST LUMPECTOMY  2006   left; pre cancer   BREAST SURGERY     Reduction   BUNIONECTOMY  2007, 2011   CATARACT EXTRACTION  06/17/2020   CERVICAL CONE BIOPSY  2003   CESAREAN SECTION  1983   DILATION AND CURETTAGE OF UTERUS  2009   ENDOMETRIAL ABLATION     Novasure   GANGLION CYST EXCISION Left 1985   HYSTEROSCOPY  2009   LEEP  05/2015   CIN-2 with clear margins   MASTECTOMY W/ SENTINEL NODE BIOPSY Right 02/26/2021   Procedure: RIGHT MASTECTOMY WITH AXILLARY SENTINEL LYMPH NODE BIOPSY;  Surgeon: Rolm Bookbinder, MD;  Location: [place];  Service: General;  Laterality: Right;   MASTECTOMY, PARTIAL     MOUTH SURGERY  2012   implants   TOTAL MASTECTOMY Left 02/26/2021   Procedure: LEFT TOTAL MASTECTOMY;  Surgeon: Rolm Bookbinder, MD;  Location: [place];  Service: General;  Laterality: Left;   TUBAL LIGATION  1984    There were no vitals filed for this visit.   Subjective Assessment - 04/28/21 1003     Subjective My movement has made alot of progress. The cording is still there and I feel  it as I move.  Incisions are doing much better but the right lateral side is still healing. The massage on my arm feels good.    Pertinent History Patient was diagnosed on 01/07/2021 with right grade II invasive ductal carcinoma breast cancer. It measures 1 cm with calcifications in the upper outer quadrant. It is ER/PR positive and HER2 negative with a Ki67 of 20%. She has a previous history of right breast DCIS with a lumpectomy and radiation in 2005. Bilateral mastectomies were performed on 02/26/2021 with 0/2 LN's on right and 0/1 LN on left.    Patient Stated Goals Post op assessment/ Be able to reach better    Currently in Pain? No/denies    Pain Score 0-No pain    Aggravating Factors  feels tightness with reaching and a burning sensation                               [facility]  Adult PT Treatment/Exercise - 04/28/21 0001       Shoulder Exercises: Supine   Horizontal ABduction Strengthening;Both;5 reps    Theraband Level (Shoulder Horizontal ABduction) Level 1 (Yellow)    External Rotation Strengthening;Both;5 reps    Theraband Level (Shoulder External Rotation) Level 1 (Yellow)    Flexion Strengthening;Both;5 reps    Theraband Level (Shoulder Flexion) Level 1 (Yellow)    Diagonals Strengthening;Both;5 reps    Theraband Level (Shoulder Diagonals) Level 1 (Yellow)      Manual Therapy   Soft tissue mobilization In Supine to  right pectorals, lats in supine and SL to serratus and scapular area before ROM and Rt upper arm into forearm where cording palpable after MFR today    Myofascial Release gently to right chest being careful not to stretch scabbed area    Passive ROM to right shoulder with elbow and wrist bent, to elbow with shoulder at side                       PT Short Term Goals - 02/18/21 0854       PT SHORT TERM GOAL #1   Title Patient will be independent with a home exercise program.    Status On-going               PT Long Term Goals - 03/23/21 1227       PT LONG TERM GOAL #1   Title Patient will demonstrate she has regained full shoulder ROM and function post operatively compared to baselines.    Time 6    Period Weeks    Status On-going    Target Date 05/04/21      PT LONG TERM GOAL #2   Title Pt will have decreased pain/discomfort from cording on right by 75%    Time 6    Period Weeks    Status New    Target Date 05/04/21      PT LONG TERM GOAL #3   Title Pt will attend ABC class    Time 6    Period Weeks    Status New    Target Date 05/04/21      PT LONG TERM GOAL #4   Title Pts quick dash will improve to no greater than 25%    Baseline 56%    Time 6    Period Weeks    Target Date 05/04/21  Plan - 04/28/21 1100     Clinical Impression Statement Continued MFR techniques to  cording, and soft tissue mobilization to the right arm, pectorals , lats and scapular area. Instructed pt in supine scap series with 5 reps of yellow theraband.  Pt did very well overall and was given illustrated and written instructions. ROM still mildly limited by cording with burning stretch noted with MFR techniques    Stability/Clinical Decision Making Stable/Uncomplicated    Rehab Potential Excellent    PT Frequency 2x / week    PT Duration 6 weeks    PT Treatment/Interventions ADLs/Self Care Home Management;Therapeutic exercise;Patient/family education;Passive range of motion;Manual techniques;Dry needling;Scar mobilization;Manual lymph drainage    PT Next Visit Plan MT to right UE cording,  PROM, STM prn, AAROM, review supine scap with yellow    PT Home Exercise Plan 4 post op exercises, NTS, supine dowel exercise,supine scapular series   Consulted and Agree with Plan of Care Patient             Patient will benefit from skilled therapeutic intervention in order to improve the following deficits and impairments:  Postural dysfunction, Decreased range of motion, Decreased knowledge of precautions, Impaired UE functional use, Pain, Decreased skin integrity, Increased edema, Impaired sensation, Decreased scar mobility  Visit Diagnosis: Abnormal posture  Chronic right shoulder pain  Localized edema  Stiffness of right shoulder, not elsewhere classified  Stiffness of left shoulder, not elsewhere classified  Malignant neoplasm of upper-outer quadrant of right breast in female, estrogen receptor positive ([place])     Problem List Patient Active Problem List   Diagnosis Date Noted   S/P bilateral mastectomy 02/26/2021   Genetic testing 02/18/2021   Family history of breast cancer 02/12/2021   Personal history of breast cancer 02/12/2021   Family history of prostate cancer 02/12/2021   Malignant neoplasm of upper-outer quadrant of right breast in female, estrogen receptor  positive ([place]) 02/04/2021   CVA (cerebral vascular accident) ([place]) 11/29/2019   Depression 11/29/2019   CKD (chronic kidney disease) stage 3, GFR 30-59 ml/min (HCC) 11/29/2019   Nausea and vomiting 03/15/2019   Gastroesophageal reflux disease 03/15/2019   Loose stools 03/15/2019   Elevated alkaline phosphatase level 03/15/2019   Herpes progenitalis    Osteopenia    Hypothyroidism    IC (interstitial cystitis)    DUCTAL CARCINOMA IN SITU, RIGHT BREAST 03/12/2008   DIVERTICULOSIS OF COLON 03/12/2008    Claris Pong, PT 04/28/2021, 12:07 PM  [facility] @ [address] [place], [place], [address] Phone: [phone]   Fax:  [phone]  Name: Jaclyn Lopez MRN: [id number] Date of Birth: 1950/10/16

## 2021-04-30 ENCOUNTER — Other Ambulatory Visit: Payer: Self-pay

## 2021-04-30 ENCOUNTER — Ambulatory Visit: Payer: Medicare PPO

## 2021-04-30 DIAGNOSIS — Z170 Estrogen receptor positive status [ER+]: Secondary | ICD-10-CM | POA: Diagnosis not present

## 2021-04-30 DIAGNOSIS — R600 Localized edema: Secondary | ICD-10-CM

## 2021-04-30 DIAGNOSIS — R293 Abnormal posture: Secondary | ICD-10-CM | POA: Diagnosis not present

## 2021-04-30 DIAGNOSIS — C50411 Malignant neoplasm of upper-outer quadrant of right female breast: Secondary | ICD-10-CM | POA: Diagnosis not present

## 2021-04-30 DIAGNOSIS — M25511 Pain in right shoulder: Secondary | ICD-10-CM

## 2021-04-30 DIAGNOSIS — M25611 Stiffness of right shoulder, not elsewhere classified: Secondary | ICD-10-CM

## 2021-04-30 DIAGNOSIS — G8929 Other chronic pain: Secondary | ICD-10-CM | POA: Diagnosis not present

## 2021-04-30 DIAGNOSIS — M25612 Stiffness of left shoulder, not elsewhere classified: Secondary | ICD-10-CM | POA: Diagnosis not present

## 2021-04-30 NOTE — Therapy (Signed)
[employer] [facility] @ [facility] [place], [place], [address] Phone: [phone]   Fax:  [phone]  Physical Therapy Treatment  Patient Details  Name: Jaclyn Lopez MRN: [id number] Date of Birth: 1951-04-01 Referring Provider (PT): Dr. Rolm Bookbinder   Encounter Date: 04/30/2021   PT End of Session - 04/30/21 1053     Visit Number 11    Number of Visits 15    Date for PT Re-Evaluation 05/04/21    Authorization Type [employer]    Authorization - Visit Number 15    PT Start Time 1856    PT Stop Time 1050    PT Time Calculation (min) 53 min    Activity Tolerance Patient tolerated treatment well    Behavior During Therapy [facility] for tasks assessed/performed             Past Medical History:  Diagnosis Date   Atypical glandular cells on Pap smear 2003   Cancer [facility]) 2005   Breast-Ductal CIS Right breast-Radiation - 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 nodes removed per pt   CKD (chronic kidney disease)    CVA (cerebral vascular accident) ([place])    Depression    Family history of breast cancer 02/12/2021   Family history of prostate cancer 02/12/2021   GERD (gastroesophageal reflux disease)    Herpes progenitalis    History of COVID-19 10/15/2020   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 Subsequent LEEP showed CIN-2 with clear margins and negative ECC   Osteopenia 11/2018   T score -1.5 FRAX 9% / 1% improved at both hips stable at spine   Personal history of breast cancer 02/12/2021   PFO (patent foramen ovale)    PONV (postoperative nausea and vomiting)    Stroke ([place]) 11/2019   Thyroid disease    Hyperthyroid    Past Surgical History:  Procedure Laterality Date   Bladder stretch  and Bx  1990   BREAST LUMPECTOMY  2005   right; cancer   BREAST LUMPECTOMY  2006   left; pre cancer   BREAST SURGERY     Reduction   BUNIONECTOMY  2007, 2011   CATARACT EXTRACTION  06/17/2020   CERVICAL CONE BIOPSY  2003   CESAREAN SECTION  1983   DILATION AND CURETTAGE OF UTERUS  2009   ENDOMETRIAL ABLATION     Novasure   GANGLION CYST EXCISION Left 1985   HYSTEROSCOPY  2009   LEEP  05/2015   CIN-2 with clear margins   MASTECTOMY W/ SENTINEL NODE BIOPSY Right 02/26/2021   Procedure: RIGHT MASTECTOMY WITH AXILLARY SENTINEL LYMPH NODE BIOPSY;  Surgeon: Rolm Bookbinder, MD;  Location: [place];  Service: General;  Laterality: Right;   MASTECTOMY, PARTIAL     MOUTH SURGERY  2012   implants   TOTAL MASTECTOMY Left 02/26/2021   Procedure: LEFT TOTAL MASTECTOMY;  Surgeon: Rolm Bookbinder, MD;  Location: [facility];  Service: General;  Laterality: Left;   TUBAL LIGATION  1984    There were no vitals filed for this visit.   Subjective Assessment - 04/30/21 0953     Subjective ROM is doing well.  Cording is still present but maybe a little better.  I notice it the most with reaching into cabinets    Pertinent History Patient was diagnosed on 01/07/2021 with right grade II invasive ductal carcinoma breast cancer. It measures 1 cm with calcifications in the upper outer quadrant. It is ER/PR positive and HER2 negative with a Ki67 of 20%. She has a previous history of right breast DCIS with a lumpectomy and radiation in 2005. Bilateral mastectomies were performed on 02/26/2021 with 0/2 LN's on right and 0/1 LN on left.    Patient Stated Goals Post op assessment/ Be able to reach better    Currently in Pain? Yes    Pain Score 1     Pain Location Arm    Pain Orientation Right    Pain Descriptors / Indicators Burning;Tightness    Pain Type Chronic pain    Pain Onset More than a month ago    Pain Frequency Intermittent                                [facility] Adult PT Treatment/Exercise - 04/30/21 0001       Shoulder Exercises: Supine   Horizontal ABduction Strengthening;Both;10 reps    Theraband Level (Shoulder Horizontal ABduction) Level 1 (Yellow)    External Rotation Strengthening;Both;10 reps    Theraband Level (Shoulder External Rotation) Level 1 (Yellow)    Flexion Strengthening;Both;5 reps    Theraband Level (Shoulder Flexion) Level 1 (Yellow)    Diagonals Strengthening;Both;10 reps    Theraband Level (Shoulder Diagonals) Level 1 (Yellow)    Other Supine Exercises supine chest stretch with horizontal abd x 3, 15 sec      Manual Therapy   Soft tissue mobilization In Supine to bilateral pectorals, lats in supine  before ROM and Rt upper arm into forearm where cording palpable after MFR today    Myofascial Release To right UE cording uper arm, axilla, and forearm    Passive ROM PROM bilateral shoulder flex, scaption, abd, IR and ER                       PT Short Term Goals - 02/18/21 7672       PT SHORT TERM GOAL #1   Title Patient will be independent with a home exercise program.    Status On-going               PT Long Term Goals - 03/23/21 1227       PT LONG TERM GOAL #1   Title Patient will demonstrate she has regained full shoulder ROM and function post operatively compared to baselines.    Time 6    Period Weeks    Status On-going    Target Date 05/04/21      PT LONG TERM GOAL #2   Title Pt will have decreased pain/discomfort from cording on right by 75%    Time 6    Period Weeks    Status New    Target Date 05/04/21      PT LONG TERM GOAL #3   Title Pt will attend ABC class    Time 6    Period Weeks    Status New    Target Date 05/04/21      PT LONG TERM GOAL #4   Title Pts quick dash will improve to no greater than 25%    Baseline 56%    Time 6    Period Weeks  Target Date 05/04/21                   Plan - 04/30/21 1054      Clinical Impression Statement Continued MFR techniques to cording, soft tissue mobilizatiom, PROM and review of supine scapular series. No pops noted with MFR techniques, however pt is making good progress with Bilateral shoulder ROM.  Right side limited more than left.    Stability/Clinical Decision Making Stable/Uncomplicated    Rehab Potential Excellent    PT Frequency 2x / week    PT Duration 6 weeks    PT Treatment/Interventions ADLs/Self Care Home Management;Therapeutic exercise;Patient/family education;Passive range of motion;Manual techniques;Dry needling;Scar mobilization;Manual lymph drainage    PT Next Visit Plan Check goals,MT to right UE cording,  PROM, STM prn, AAROM, , scar massage    PT Home Exercise Plan 4 post op exercises, NTS, supine dowel exercise    Consulted and Agree with Plan of Care Patient             Patient will benefit from skilled therapeutic intervention in order to improve the following deficits and impairments:  Postural dysfunction, Decreased range of motion, Decreased knowledge of precautions, Impaired UE functional use, Pain, Decreased skin integrity, Increased edema, Impaired sensation, Decreased scar mobility  Visit Diagnosis: Abnormal posture  Chronic right shoulder pain  Localized edema  Stiffness of right shoulder, not elsewhere classified  Stiffness of left shoulder, not elsewhere classified  Malignant neoplasm of upper-outer quadrant of right breast in female, estrogen receptor positive ([place])     Problem List Patient Active Problem List   Diagnosis Date Noted   S/P bilateral mastectomy 02/26/2021   Genetic testing 02/18/2021   Family history of breast cancer 02/12/2021   Personal history of breast cancer 02/12/2021   Family history of prostate cancer 02/12/2021   Malignant neoplasm of upper-outer quadrant of right breast in female, estrogen receptor positive ([place]) 02/04/2021   CVA (cerebral vascular accident) ([place]) 11/29/2019    Depression 11/29/2019   CKD (chronic kidney disease) stage 3, GFR 30-59 ml/min ([facility]CC) 11/29/2019   Nausea and vomiting 03/15/2019   Gastroesophageal reflux disease 03/15/2019   Loose stools 03/15/2019   Elevated alkaline phosphatase level 03/15/2019   Herpes progenitalis    Osteopenia    Hypothyroidism    IC (interstitial cystitis)    DUCTAL CARCINOMA IN SITU, RIGHT BREAST 03/12/2008   DIVERTICULOSIS OF COLON 03/12/2008    Claris Pong, PT 04/30/2021, 10:57 AM  [facility] @ [place], [place], [address] Phone: [phone]   Fax:  [phone]  Name: Jaclyn Lopez MRN: [id number] Date of Birth: January 30, 1951

## 2021-05-05 ENCOUNTER — Other Ambulatory Visit: Payer: Self-pay

## 2021-05-05 ENCOUNTER — Ambulatory Visit: Payer: Medicare PPO

## 2021-05-05 DIAGNOSIS — M25611 Stiffness of right shoulder, not elsewhere classified: Secondary | ICD-10-CM | POA: Diagnosis not present

## 2021-05-05 DIAGNOSIS — C50411 Malignant neoplasm of upper-outer quadrant of right female breast: Secondary | ICD-10-CM | POA: Diagnosis not present

## 2021-05-05 DIAGNOSIS — G8929 Other chronic pain: Secondary | ICD-10-CM | POA: Diagnosis not present

## 2021-05-05 DIAGNOSIS — R600 Localized edema: Secondary | ICD-10-CM

## 2021-05-05 DIAGNOSIS — R293 Abnormal posture: Secondary | ICD-10-CM | POA: Diagnosis not present

## 2021-05-05 DIAGNOSIS — M25612 Stiffness of left shoulder, not elsewhere classified: Secondary | ICD-10-CM

## 2021-05-05 DIAGNOSIS — M25511 Pain in right shoulder: Secondary | ICD-10-CM | POA: Diagnosis not present

## 2021-05-05 DIAGNOSIS — Z170 Estrogen receptor positive status [ER+]: Secondary | ICD-10-CM | POA: Diagnosis not present

## 2021-05-05 NOTE — Therapy (Signed)
[facility] @ [place] [place], [place], [address] Phone: [phone]   Fax:  [phone]  Physical Therapy Treatment  Patient Details  Name: Jaclyn Lopez MRN: [id number] Date of Birth: 1950/10/07 Referring Provider (PT): Dr. Rolm Bookbinder   Encounter Date: 05/05/2021   PT End of Session - 05/05/21 1141     Visit Number 12    Number of Visits 20    Date for PT Re-Evaluation 06/02/21    Authorization Type [employer]    Authorization - Visit Number 20    PT Start Time 1100    PT Stop Time 1975    PT Time Calculation (min) 59 min    Activity Tolerance Patient tolerated treatment well    Behavior During Therapy [facility] for tasks assessed/performed             Past Medical History:  Diagnosis Date   Atypical glandular cells on Pap smear 2003   Cancer [facility]) 2005   Breast-Ductal CIS Right breast-Radiation - no lymph nodes removed per pt   CKD (chronic kidney disease)    CVA (cerebral vascular accident) ([place])    Depression    Family history of breast cancer 02/12/2021   Family history of prostate cancer 02/12/2021   GERD (gastroesophageal reflux disease)    Herpes progenitalis    History of COVID-19 10/15/2020   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 Subsequent LEEP showed CIN-2 with clear margins and negative ECC   Osteopenia 11/2018   T score -1.5 FRAX 9% / 1% improved at both hips stable at spine   Personal history of breast cancer 02/12/2021   PFO (patent foramen ovale)    PONV (postoperative nausea and vomiting)    Stroke ([place]) 11/2019   Thyroid disease    Hyperthyroid    Past Surgical History:  Procedure Laterality Date   Bladder stretch  and Bx  1990   BREAST LUMPECTOMY  2005   right; cancer   BREAST LUMPECTOMY  2006   left; pre cancer   BREAST SURGERY     Reduction   BUNIONECTOMY  2007, 2011   CATARACT EXTRACTION  06/17/2020   CERVICAL CONE BIOPSY  2003   CESAREAN SECTION  1983   DILATION AND CURETTAGE OF UTERUS  2009   ENDOMETRIAL ABLATION     Novasure   GANGLION CYST EXCISION Left 1985   HYSTEROSCOPY  2009   LEEP  05/2015   CIN-2 with clear margins   MASTECTOMY W/ SENTINEL NODE BIOPSY Right 02/26/2021   Procedure: RIGHT MASTECTOMY WITH AXILLARY SENTINEL LYMPH NODE BIOPSY;  Surgeon: Rolm Bookbinder, MD;  Location: [place];  Service: General;  Laterality: Right;   MASTECTOMY, PARTIAL     MOUTH SURGERY  2012   implants   TOTAL MASTECTOMY Left 02/26/2021   Procedure: LEFT TOTAL MASTECTOMY;  Surgeon: Rolm Bookbinder, MD;  Location: [place];  Service: General;  Laterality: Left;   TUBAL LIGATION  1984    There were no vitals filed for this visit.   Subjective Assessment - 05/05/21 1059     Subjective Cording is better, but I still feel it in the forearm. It is 80% better,  I feel nearly back to normal with ROM. I don't feel like I have alot of limitations, but I am still a little hesitant with my right arm.    Pertinent History Patient was diagnosed on 01/07/2021 with right grade II invasive ductal carcinoma breast cancer. It measures 1 cm with calcifications in the upper outer quadrant. It is ER/PR positive and HER2 negative with a Ki67 of 20%. She has a previous history of right breast DCIS with a lumpectomy and radiation in 2005. Bilateral mastectomies were performed on 02/26/2021 with 0/2 LN's on right and 0/1 LN on left.    Patient Stated Goals Post op assessment/ Be able to reach better    Currently in Pain? No/denies    Pain Score 0-No pain                [facility] PT Assessment - 05/05/21 0001       Assessment   Medical Diagnosis Right breast cancer and right  scapula pain    Referring Provider (PT) Dr. Rolm Bookbinder    Onset Date/Surgical Date 04/28/21    Hand Dominance Right      Prior Function   Level of Independence Independent      Cognition   Overall Cognitive Status Within Functional Limits for tasks assessed      Observation/Other Assessments   Observations pt continues with cording in right upper and lower arm but with discomfort improved by 80%. Mild swelling continues bilateral lateral trunk. Scab still present right lateral mastectomy incision      AROM   Right Shoulder Extension 42 Degrees    Right Shoulder Flexion 149 Degrees    Right Shoulder ABduction 160 Degrees    Right Shoulder External Rotation 96 Degrees    Left Shoulder Extension 50 Degrees    Left Shoulder Flexion 152 Degrees    Left Shoulder ABduction 166 Degrees    Left Shoulder External Rotation 97 Degrees                   Quick Dash - 05/05/21 0001     Open a tight or new jar Moderate difficulty    Do heavy household chores (wash walls, wash floors) Moderate difficulty    Carry a shopping bag or briefcase Mild difficulty    Wash your back Mild difficulty    Use a knife to cut food No difficulty    Recreational activities in which you take some force or impact through your arm, shoulder, or hand (golf, hammering, tennis) Severe difficulty    During the past week, to what extent has your arm, shoulder or hand problem interfered with your normal social activities with family, friends, neighbors, or groups? Not at all    During the past week, to what extent has your arm, shoulder or hand problem limited your work or other regular daily activities Not at all    Arm, shoulder, or hand pain. Mild    Tingling (pins and needles) in your arm, shoulder, or hand None    Difficulty Sleeping No difficulty    DASH Score 22.73 %                    [facility] Adult PT Treatment/Exercise - 05/05/21 0001       Shoulder Exercises: Pulleys   Flexion 2  minutes    Scaption 2 minutes      Manual Therapy   Myofascial Release to right forearm and axillary region to decrease cording  PT Short Term Goals - 02/18/21 0854       PT SHORT TERM GOAL #1   Title Patient will be independent with a home exercise program.    Status achieved              PT Long Term Goals - 05/05/21 1108       PT LONG TERM GOAL #1   Title Patient will demonstrate she has regained full shoulder ROM and function post operatively compared to baselines.    Baseline yes for ROM, no for normal function    Time 6    Period Weeks    Status On-going    Target Date 06/02/21      PT LONG TERM GOAL #2   Title Pt will have decreased pain/discomfort from cording on right by 75%    Time 6    Period Weeks    Status Achieved    Target Date 05/05/21      PT LONG TERM GOAL #3   Title Pt will attend ABC class    Time 6    Period Weeks    Status Achieved    Target Date 05/05/21      PT LONG TERM GOAL #4   Title Pts quick dash will improve to no greater than 25%    Baseline 56% at baseline, 22 % today    Time 6    Period Weeks    Status Achieved    Target Date 05/05/21      PT LONG TERM GOAL #5   Title Pt will be independent in a progressive strengthening program for return to normal function    Time 4    Period Weeks    Status New    Target Date 06/02/21      Additional Long Term Goals   Additional Long Term Goals Yes      PT LONG TERM GOAL #6   Title Pt will be independent with MLD as needed to decrease swelling    Time 4    Period Weeks    Status New    Target Date 06/02/21                   Plan - 05/05/21 1202     Clinical Impression Statement Pts recertification done today.  She has achieved ROM portion of LTG number 1 but has not achieved function portion of goal yet.  She has attend   the ABC class.  Pain from cording has improved by 80%, and she has achieved her quick dash goal .  She  continues with mild swelling at the lateral trunk regions and under incisions.  She was given a script today and [employer]ight form to get Bilateral compression sleeves/gauntlets.  She was also given 2 knitted knockers. She will benefit from skilled PT to address strength and gfunction as well as swelling as needed, and continued emphasis on cording.    Stability/Clinical Decision Making Stable/Uncomplicated    Rehab Potential Excellent    PT Frequency 2x / week    PT Duration 4 weeks    PT Treatment/Interventions ADLs/Self Care Home Management;Therapeutic exercise;Patient/family education;Passive range of motion;Manual techniques;Dry needling;Scar mobilization;Manual lymph drainage    PT Next Visit Plan MFR to right cording, STM prn, AROM and start ABC strength handout,scar massage    PT Home Exercise Plan 4 post op exercises, NTS, supine dowel exercise    Consulted and Agree with Plan of Care Patient  Patient will benefit from skilled therapeutic intervention in order to improve the following deficits and impairments:  Postural dysfunction, Decreased range of motion, Decreased knowledge of precautions, Impaired UE functional use, Pain, Decreased skin integrity, Increased edema, Impaired sensation, Decreased scar mobility  Visit Diagnosis: Abnormal posture  Chronic right shoulder pain  Localized edema  Stiffness of right shoulder, not elsewhere classified  Stiffness of left shoulder, not elsewhere classified  Malignant neoplasm of upper-outer quadrant of right breast in female, estrogen receptor positive [facility])     Problem List Patient Active Problem List   Diagnosis Date Noted   S/P bilateral mastectomy 02/26/2021   Genetic testing 02/18/2021   Family history of breast cancer 02/12/2021   Personal history of breast cancer 02/12/2021   Family history of prostate cancer 02/12/2021   Malignant neoplasm of upper-outer quadrant of right breast in female, estrogen receptor  positive ([place]) 02/04/2021   CVA (cerebral vascular accident) ([place]) 11/29/2019   Depression 11/29/2019   CKD (chronic kidney disease) stage 3, GFR 30-59 ml/min (HCC) 11/29/2019   Nausea and vomiting 03/15/2019   Gastroesophageal reflux disease 03/15/2019   Loose stools 03/15/2019   Elevated alkaline phosphatase level 03/15/2019   Herpes progenitalis    Osteopenia    Hypothyroidism    IC (interstitial cystitis)    DUCTAL CARCINOMA IN SITU, RIGHT BREAST 03/12/2008   DIVERTICULOSIS OF COLON 03/12/2008    Claris Pong, PT 05/05/2021, 12:13 PM  [facility] @ [address] [place], [place], [address] Phone: [phone]   Fax:  [phone]  Name: Jaclyn Lopez MRN: [id number] Date of Birth: 1950-09-11

## 2021-05-07 ENCOUNTER — Other Ambulatory Visit: Payer: Self-pay

## 2021-05-07 ENCOUNTER — Ambulatory Visit: Payer: Medicare PPO

## 2021-05-07 DIAGNOSIS — M25612 Stiffness of left shoulder, not elsewhere classified: Secondary | ICD-10-CM | POA: Diagnosis not present

## 2021-05-07 DIAGNOSIS — M25511 Pain in right shoulder: Secondary | ICD-10-CM | POA: Diagnosis not present

## 2021-05-07 DIAGNOSIS — R600 Localized edema: Secondary | ICD-10-CM | POA: Diagnosis not present

## 2021-05-07 DIAGNOSIS — R293 Abnormal posture: Secondary | ICD-10-CM | POA: Diagnosis not present

## 2021-05-07 DIAGNOSIS — M25611 Stiffness of right shoulder, not elsewhere classified: Secondary | ICD-10-CM | POA: Diagnosis not present

## 2021-05-07 DIAGNOSIS — Z170 Estrogen receptor positive status [ER+]: Secondary | ICD-10-CM | POA: Diagnosis not present

## 2021-05-07 DIAGNOSIS — C50411 Malignant neoplasm of upper-outer quadrant of right female breast: Secondary | ICD-10-CM | POA: Diagnosis not present

## 2021-05-07 DIAGNOSIS — G8929 Other chronic pain: Secondary | ICD-10-CM | POA: Diagnosis not present

## 2021-05-07 NOTE — Therapy (Signed)
[place] @ [place], [place], [address] Phone: [phone]   Fax:  [phone]  Physical Therapy Treatment  Patient Details  Name: Jaclyn Lopez MRN: [id number] Date of Birth: April 20, 1951 Referring Provider (PT): Dr. Rolm Bookbinder   Encounter Date: 05/07/2021   PT End of Session - 05/07/21 0956     Visit Number 13    Number of Visits 20    Date for PT Re-Evaluation 06/02/21    Authorization Type [employer]    Authorization - Visit Number 20    PT Start Time 0900    PT Stop Time 4403    PT Time Calculation (min) 54 min    Activity Tolerance Patient tolerated treatment well    Behavior During Therapy [facility] for tasks assessed/performed             Past Medical History:  Diagnosis Date   Atypical glandular cells on Pap smear 2003   Cancer [facility]) 2005   Breast-Ductal CIS Right breast-Radiation - 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 nodes removed per pt   CKD (chronic kidney disease)    CVA (cerebral vascular accident) ([place])    Depression    Family history of breast cancer 02/12/2021   Family history of prostate cancer 02/12/2021   GERD (gastroesophageal reflux disease)    Herpes progenitalis    History of COVID-19 10/15/2020   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 Subsequent LEEP showed CIN-2 with clear margins and negative ECC   Osteopenia 11/2018   T score -1.5 FRAX 9% / 1% improved at both hips stable at spine   Personal history of breast cancer 02/12/2021   PFO (patent foramen ovale)    PONV (postoperative nausea and vomiting)    Stroke ([place]) 11/2019   Thyroid disease    Hyperthyroid    Past Surgical History:  Procedure Laterality Date   Bladder stretch  and Bx  1990   BREAST LUMPECTOMY  2005   right; cancer   BREAST LUMPECTOMY  2006   left; pre cancer   BREAST SURGERY     Reduction   BUNIONECTOMY  2007, 2011   CATARACT EXTRACTION  06/17/2020   CERVICAL CONE BIOPSY  2003   CESAREAN SECTION  1983   DILATION AND CURETTAGE OF UTERUS  2009   ENDOMETRIAL ABLATION     Novasure   GANGLION CYST EXCISION Left 1985   HYSTEROSCOPY  2009   LEEP  05/2015   CIN-2 with clear margins   MASTECTOMY W/ SENTINEL NODE BIOPSY Right 02/26/2021   Procedure: RIGHT MASTECTOMY WITH AXILLARY SENTINEL LYMPH NODE BIOPSY;  Surgeon: Rolm Bookbinder, MD;  Location: [facility];  Service: General;  Laterality: Right;   MASTECTOMY, PARTIAL     MOUTH SURGERY  2012   implants   TOTAL MASTECTOMY Left 02/26/2021   Procedure: LEFT TOTAL MASTECTOMY;  Surgeon: Rolm Bookbinder, MD;  Location: [facility];  Service: General;  Laterality: Left;   TUBAL LIGATION  1984    There were no vitals filed for this visit.   Subjective Assessment - 05/07/21 0859     Subjective Still doing very well.  Forearm is feeling much better still withthe cording. Will try  to get my sleeves after Christmas. theres just too much going on right now.    Pertinent History Patient was diagnosed on 01/07/2021 with right grade II invasive ductal carcinoma breast cancer. It measures 1 cm with calcifications in the upper outer quadrant. It is ER/PR positive and HER2 negative with a Ki67 of 20%. She has a previous history of right breast DCIS with a lumpectomy and radiation in 2005. Bilateral mastectomies were performed on 02/26/2021 with 0/2 LN's on right and 0/1 LN on left.    Patient Stated Goals Post op assessment/ Be able to reach better    Currently in Pain? No/denies    Pain Score 0-No pain    Multiple Pain Sites No                               [facility] Adult PT Treatment/Exercise - 05/07/21 0001       Exercises   Other Exercises  pt taken  throughABC strength handout for flexibility exercises only holding each for 15 secs done bilaterally. Pt given handout to practice stretches at home..      Manual Therapy   Soft tissue mobilization In Supine to bilateral pectorals, lats in supine, and on right side in SL to UT and scapular region. Scar mobilization right chest   Myofascial Release to right forearm and axillary region to decrease cording    Passive ROM PROM bilateral shoulder flex, scaption, abd, IR and ER                       PT Short Term Goals - 02/18/21 0854       PT SHORT TERM GOAL #1   Title Patient will be independent with a home exercise program.    Status On-going               PT Long Term Goals - 05/05/21 1108       PT LONG TERM GOAL #1   Title Patient will demonstrate she has regained full shoulder ROM and function post operatively compared to baselines.    Baseline yes for ROM, no for normal function    Time 6    Period Weeks    Status On-going    Target Date 06/02/21      PT LONG TERM GOAL #2   Title Pt will have decreased pain/discomfort from cording on right by 75%    Time 6    Period Weeks    Status Achieved    Target Date 05/05/21      PT LONG TERM GOAL #3   Title Pt will attend ABC class    Time 6    Period Weeks    Status Achieved    Target Date 05/05/21      PT LONG TERM GOAL #4   Title Pts quick dash will improve to no greater than 25%    Baseline 56% at baseline, 22 % today    Time 6    Period Weeks    Status Achieved    Target Date 05/05/21      PT LONG TERM GOAL #5   Title Pt will be independent in a progressive strengthening program for return to normal function    Time 4    Period Weeks    Status New    Target Date 06/02/21      Additional Long Term Goals   Additional Long Term Goals  Yes      PT LONG TERM GOAL #6   Title Pt will be independent with MLD as needed to decrease swelling    Time 4    Period Weeks    Status New    Target Date  06/02/21                   Plan - 05/07/21 0957     Clinical Impression Statement Continued soft tissue mobilization bilaterally, PROM bilaterally and MFR to right arm areas of cording. Also performed gentle scar massage to right mastectomy incision. Instructed pt in [facility] handout for stretches holding each for 15 secs.  Modified sitting butterfly stretch to supine figure 4 stretch. Pt extremely tight in quads with stretching and may need to try modification    Stability/Clinical Decision Making Stable/Uncomplicated    Rehab Potential Excellent    PT Frequency 2x / week    PT Duration 4 weeks    PT Treatment/Interventions ADLs/Self Care Home Management;Therapeutic exercise;Patient/family education;Passive range of motion;Manual techniques;Dry needling;Scar mobilization;Manual lymph drainage    PT Next Visit Plan MFR to right cording, STM prn, AROM and continueABC strength handou starting at Core strength,t,scar massage    PT Home Exercise Plan 4 post op exercises, NTS, supine dowel exercise    Consulted and Agree with Plan of Care Patient             Patient will benefit from skilled therapeutic intervention in order to improve the following deficits and impairments:  Postural dysfunction, Decreased range of motion, Decreased knowledge of precautions, Impaired UE functional use, Pain, Decreased skin integrity, Increased edema, Impaired sensation, Decreased scar mobility  Visit Diagnosis: Abnormal posture  Chronic right shoulder pain  Localized edema  Stiffness of right shoulder, not elsewhere classified  Stiffness of left shoulder, not elsewhere classified  Malignant neoplasm of upper-outer quadrant of right breast in female, estrogen receptor positive ([place])     Problem List Patient Active Problem List   Diagnosis Date Noted   S/P bilateral mastectomy 02/26/2021   Genetic testing 02/18/2021   Family history of breast cancer 02/12/2021   Personal history of breast  cancer 02/12/2021   Family history of prostate cancer 02/12/2021   Malignant neoplasm of upper-outer quadrant of right breast in female, estrogen receptor positive ([place]) 02/04/2021   CVA (cerebral vascular accident) ([place]) 11/29/2019   Depression 11/29/2019   CKD (chronic kidney disease) stage 3, GFR 30-59 ml/min ([facility]CC) 11/29/2019   Nausea and vomiting 03/15/2019   Gastroesophageal reflux disease 03/15/2019   Loose stools 03/15/2019   Elevated alkaline phosphatase level 03/15/2019   Herpes progenitalis    Osteopenia    Hypothyroidism    IC (interstitial cystitis)    DUCTAL CARCINOMA IN SITU, RIGHT BREAST 03/12/2008   DIVERTICULOSIS OF COLON 03/12/2008    Claris Pong, PT 05/07/2021, 10:00 AM  [facility] @ [address] [place], [place], [address] Phone: [phone]   Fax:  [phone]  Name: Jaclyn Lopez MRN: [id number] Date of Birth: Jun 16, 1950

## 2021-05-12 ENCOUNTER — Telehealth: Payer: Self-pay | Admitting: Internal Medicine

## 2021-05-12 MED ORDER — OMEPRAZOLE 40 MG PO CPDR: 40.0000 mg | DELAYED_RELEASE_CAPSULE | Freq: Every day | ORAL | 1 refills | Status: DC | PRN

## 2021-05-12 NOTE — Telephone Encounter (Signed)
Patient last seen 03-2020. Refill of omeprazole sent to the pharmacy and requested the patient make appointment for further refills

## 2021-05-12 NOTE — Telephone Encounter (Signed)
Patient called stating she was having issues with reflux and asked if you could please call in a prescription for her for omeprazole to [facility] on [address]  Thank you.

## 2021-05-19 ENCOUNTER — Ambulatory Visit: Payer: Medicare PPO | Attending: General Surgery

## 2021-05-19 ENCOUNTER — Other Ambulatory Visit: Payer: Self-pay

## 2021-05-19 DIAGNOSIS — Z170 Estrogen receptor positive status [ER+]: Secondary | ICD-10-CM | POA: Diagnosis not present

## 2021-05-19 DIAGNOSIS — M25611 Stiffness of right shoulder, not elsewhere classified: Secondary | ICD-10-CM | POA: Insufficient documentation

## 2021-05-19 DIAGNOSIS — C50411 Malignant neoplasm of upper-outer quadrant of right female breast: Secondary | ICD-10-CM | POA: Diagnosis not present

## 2021-05-19 DIAGNOSIS — G8929 Other chronic pain: Secondary | ICD-10-CM | POA: Diagnosis not present

## 2021-05-19 DIAGNOSIS — M25612 Stiffness of left shoulder, not elsewhere classified: Secondary | ICD-10-CM | POA: Insufficient documentation

## 2021-05-19 DIAGNOSIS — R600 Localized edema: Secondary | ICD-10-CM | POA: Diagnosis not present

## 2021-05-19 DIAGNOSIS — M25511 Pain in right shoulder: Secondary | ICD-10-CM | POA: Insufficient documentation

## 2021-05-19 DIAGNOSIS — R293 Abnormal posture: Secondary | ICD-10-CM | POA: Insufficient documentation

## 2021-05-19 NOTE — Therapy (Signed)
[facility] @ [address] [place], [place], [address] Phone: [phone]   Fax:  [phone]  Physical Therapy Treatment  Patient Details  Name: Jaclyn Lopez MRN: [id number] Date of Birth: [birth year] Referring Provider (PT): Dr. Rolm Bookbinder   Encounter Date: 05/19/2021   PT End of Session - 05/19/21 1409     Visit Number 14    Number of Visits 20    Date for PT Re-Evaluation 06/02/21    Authorization Type [employer]    Authorization - Visit Number 20    PT Start Time 3846    PT Stop Time 6599    PT Time Calculation (min) 50 min    Activity Tolerance Patient tolerated treatment well    Behavior During Therapy [facility] for tasks assessed/performed             Past Medical History:  Diagnosis Date   Atypical glandular cells on Pap smear 2003   Cancer [facility]) 2005   Breast-Ductal CIS Right breast-Radiation - no lymph nodes removed per pt   CKD (chronic kidney disease)    CVA (cerebral vascular accident) ([place])    Depression    Family history of breast cancer 02/12/2021   Family history of prostate cancer 02/12/2021   GERD (gastroesophageal reflux disease)    Herpes progenitalis    History of COVID-19 10/15/2020   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 Subsequent LEEP showed CIN-2 with clear margins and negative ECC   Osteopenia 11/2018   T score -1.5 FRAX 9% / 1% improved at both hips stable at spine   Personal history of breast cancer 02/12/2021   PFO (patent foramen ovale)    PONV (postoperative nausea and vomiting)    Stroke ([place]) 11/2019   Thyroid disease    Hyperthyroid    Past Surgical History:  Procedure Laterality Date   Bladder stretch  and Bx  1990   BREAST LUMPECTOMY  2005   right; cancer   BREAST LUMPECTOMY  2006   left; pre cancer   BREAST SURGERY     Reduction   BUNIONECTOMY  2007, 2011   CATARACT EXTRACTION  06/17/2020   CERVICAL CONE BIOPSY  2003   CESAREAN SECTION  1983   DILATION AND CURETTAGE OF UTERUS  2009   ENDOMETRIAL ABLATION     Novasure   GANGLION CYST EXCISION Left 1985   HYSTEROSCOPY  2009   LEEP  05/2015   CIN-2 with clear margins   MASTECTOMY W/ SENTINEL NODE BIOPSY Right 02/26/2021   Procedure: RIGHT MASTECTOMY WITH AXILLARY SENTINEL LYMPH NODE BIOPSY;  Surgeon: Rolm Bookbinder, MD;  Location: [place];  Service: General;  Laterality: Right;   MASTECTOMY, PARTIAL     MOUTH SURGERY  2012   implants   TOTAL MASTECTOMY Left 02/26/2021   Procedure: LEFT TOTAL MASTECTOMY;  Surgeon: Rolm Bookbinder, MD;  Location: [place];  Service: General;  Laterality: Left;   TUBAL LIGATION  1984    There were no vitals filed for this visit.   Subjective Assessment - 05/19/21 1406     Subjective Cording is still there but doesn't feel as bad and the forearm remains better. I  did some of the ABC class stretches/  I started some leg exercises at the gym the other day.  I have an appt for the sleeves next week.   Pertinent History Patient was diagnosed on 01/07/2021 with right grade II invasive ductal carcinoma breast cancer. It measures 1 cm with calcifications in the upper outer quadrant. It is ER/PR positive and HER2 negative with a Ki67 of 20%. She has a previous history of right breast DCIS with a lumpectomy and radiation in 2005. Bilateral mastectomies were performed on 02/26/2021 with 0/2 LN's on right and 0/1 LN on left.    Currently in Pain? No/denies    Pain Score 0-No pain                               [facility] Adult PT Treatment/Exercise - 05/19/21 0001       Exercises   Other Exercises  ABC stren core strength through      Manual  Therapy   Myofascial Release to right forearm and axillary region to decrease cording and left pectorals to improve ROM    Passive ROM PROM bilateral shoulder flex, scaption, abd, IR and ER                       PT Short Term Goals - 02/18/21 0854       PT SHORT TERM GOAL #1   Title Patient will be independent with a home exercise program.    Status On-going               PT Long Term Goals - 05/05/21 1108       PT LONG TERM GOAL #1   Title Patient will demonstrate she has regained full shoulder ROM and function post operatively compared to baselines.    Baseline yes for ROM, no for normal function    Time 6    Period Weeks    Status On-going    Target Date 06/02/21      PT LONG TERM GOAL #2   Title Pt will have decreased pain/discomfort from cording on right by 75%    Time 6    Period Weeks    Status Achieved    Target Date 05/05/21      PT LONG TERM GOAL #3   Title Pt will attend ABC class    Time 6    Period Weeks    Status Achieved    Target Date 05/05/21      PT LONG TERM GOAL #4   Title Pts quick dash will improve to no greater than 25%    Baseline 56% at baseline, 22 % today    Time 6    Period Weeks    Status Achieved    Target Date 05/05/21      PT LONG TERM GOAL #5   Title Pt will be independent in a progressive strengthening program for return to normal function    Time 4    Period Weeks    Status New    Target Date 06/02/21      Additional Long Term Goals   Additional Long Term Goals Yes      PT LONG TERM GOAL #6   Title Pt will be independent with MLD as needed to decrease swelling    Time 4    Period Weeks    Status New    Target Date 06/02/21  Plan - 05/19/21 1435     Clinical Impression Statement Performed MFR to right area of cording and PROM to bilateral shoulders with pt making excellent progress with each.  Pt still has one small scab present in the right chest region. Took pt. through  [facility] strength handout core strength through shoulder elevation skipping dead lifts. 1# wt for elevation and 2# wt for rows.  all x 10 reps except quadruped superwoman exs only 3 reps with legs only and 1 rep alternate arm and leg secondary to arms trembly.    Stability/Clinical Decision Making Stable/Uncomplicated    Rehab Potential Excellent    PT Frequency 2x / week    PT Duration 4 weeks    PT Treatment/Interventions ADLs/Self Care Home Management;Therapeutic exercise;Patient/family education;Passive range of motion;Manual techniques;Dry needling;Scar mobilization;Manual lymph drainage    PT Next Visit Plan check goals,MFR to right cording, STM prn, AROM and continueABC strength starting at step ups to end, DC if pt ready    PT Home Exercise Plan 4 post op exercises, NTS, supine dowel exercise    Consulted and Agree with Plan of Care Patient             Patient will benefit from skilled therapeutic intervention in order to improve the following deficits and impairments:  Postural dysfunction, Decreased range of motion, Decreased knowledge of precautions, Impaired UE functional use, Pain, Decreased skin integrity, Increased edema, Impaired sensation, Decreased scar mobility  Visit Diagnosis: Abnormal posture  Chronic right shoulder pain  Localized edema  Stiffness of right shoulder, not elsewhere classified  Stiffness of left shoulder, not elsewhere classified  Malignant neoplasm of upper-outer quadrant of right breast in female, estrogen receptor positive ([place])     Problem List Patient Active Problem List   Diagnosis Date Noted   S/P bilateral mastectomy 02/26/2021   Genetic testing 02/18/2021   Family history of breast cancer 02/12/2021   Personal history of breast cancer 02/12/2021   Family history of prostate cancer 02/12/2021   Malignant neoplasm of upper-outer quadrant of right breast in female, estrogen receptor positive ([place]) 02/04/2021   CVA (cerebral vascular  accident) ([place]) 11/29/2019   Depression 11/29/2019   CKD (chronic kidney disease) stage 3, GFR 30-59 ml/min ([facility]) 11/29/2019   Nausea and vomiting 03/15/2019   Gastroesophageal reflux disease 03/15/2019   Loose stools 03/15/2019   Elevated alkaline phosphatase level 03/15/2019   Herpes progenitalis    Osteopenia    Hypothyroidism    IC (interstitial cystitis)    DUCTAL CARCINOMA IN SITU, RIGHT BREAST 03/12/2008   DIVERTICULOSIS OF COLON 03/12/2008    Claris Pong, PT 05/19/2021, 3:01 PM  [facility] @ [address] [place], [place], [address] Phone: [phone]   Fax:  [phone]  Name: Jaclyn Lopez MRN: [id number] Date of Birth: 12-02-1950

## 2021-05-21 ENCOUNTER — Ambulatory Visit: Payer: Medicare PPO

## 2021-05-21 ENCOUNTER — Other Ambulatory Visit: Payer: Self-pay

## 2021-05-21 DIAGNOSIS — R600 Localized edema: Secondary | ICD-10-CM

## 2021-05-21 DIAGNOSIS — M25612 Stiffness of left shoulder, not elsewhere classified: Secondary | ICD-10-CM | POA: Diagnosis not present

## 2021-05-21 DIAGNOSIS — C50411 Malignant neoplasm of upper-outer quadrant of right female breast: Secondary | ICD-10-CM

## 2021-05-21 DIAGNOSIS — G8929 Other chronic pain: Secondary | ICD-10-CM

## 2021-05-21 DIAGNOSIS — Z170 Estrogen receptor positive status [ER+]: Secondary | ICD-10-CM

## 2021-05-21 DIAGNOSIS — R293 Abnormal posture: Secondary | ICD-10-CM

## 2021-05-21 DIAGNOSIS — M25611 Stiffness of right shoulder, not elsewhere classified: Secondary | ICD-10-CM

## 2021-05-21 DIAGNOSIS — M25511 Pain in right shoulder: Secondary | ICD-10-CM | POA: Diagnosis not present

## 2021-05-21 NOTE — Patient Instructions (Signed)
Access Code: [email or web address] URL: [email or web address] Date: 05/21/2021 Prepared by: Cheral Almas  Exercises Doorway Pec Stretch at 90 Degrees Abduction - 1 x daily - 7 x weekly - 1-2 sets - 3 reps - 20-30 hold

## 2021-05-21 NOTE — Therapy (Signed)
[facility] @ [place] [place], [place], [address] Phone: [phone]   Fax:  [phone]  Physical Therapy Treatment  Patient Details  Name: Jaclyn Lopez MRN: [id number] Date of Birth: [birth year] Referring Provider (PT): Dr. Rolm Bookbinder   Encounter Date: 05/21/2021   PT End of Session - 05/21/21 1714     Visit Number 15    Number of Visits 20    Date for PT Re-Evaluation 06/02/21    Authorization Type [employer]    Authorization - Visit Number 20    PT Start Time 1500    PT Stop Time 1287    PT Time Calculation (min) 61 min    Activity Tolerance Patient tolerated treatment well    Behavior During Therapy [facility] for tasks assessed/performed             Past Medical History:  Diagnosis Date   Atypical glandular cells on Pap smear 2003   Cancer [facility]) 2005   Breast-Ductal CIS Right breast-Radiation - 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 nodes removed per pt   CKD (chronic kidney disease)    CVA (cerebral vascular accident) ([place])    Depression    Family history of breast cancer 02/12/2021   Family history of prostate cancer 02/12/2021   GERD (gastroesophageal reflux disease)    Herpes progenitalis    History of COVID-19 10/15/2020   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 Subsequent LEEP showed CIN-2 with clear margins and negative ECC   Osteopenia 11/2018   T score -1.5 FRAX 9% / 1% improved at both hips stable at spine   Personal history of breast cancer 02/12/2021   PFO (patent foramen ovale)    PONV (postoperative nausea and vomiting)    Stroke ([place]) 11/2019   Thyroid disease    Hyperthyroid    Past Surgical History:  Procedure Laterality Date   Bladder stretch  and Bx  1990   BREAST LUMPECTOMY  2005   right; cancer   BREAST LUMPECTOMY  2006   left; pre cancer   BREAST SURGERY     Reduction   BUNIONECTOMY  2007, 2011   CATARACT EXTRACTION  06/17/2020   CERVICAL CONE BIOPSY  2003   CESAREAN SECTION  1983   DILATION AND CURETTAGE OF UTERUS  2009   ENDOMETRIAL ABLATION     Novasure   GANGLION CYST EXCISION Left 1985   HYSTEROSCOPY  2009   LEEP  05/2015   CIN-2 with clear margins   MASTECTOMY W/ SENTINEL NODE BIOPSY Right 02/26/2021   Procedure: RIGHT MASTECTOMY WITH AXILLARY SENTINEL LYMPH NODE BIOPSY;  Surgeon: Rolm Bookbinder, MD;  Location: [place];  Service: General;  Laterality: Right;   MASTECTOMY, PARTIAL     MOUTH SURGERY  2012   implants   TOTAL MASTECTOMY Left 02/26/2021   Procedure: LEFT TOTAL MASTECTOMY;  Surgeon: Rolm Bookbinder, MD;  Location: [place];  Service: General;  Laterality: Left;   TUBAL LIGATION  1984    There were no vitals filed for this visit.   Subjective Assessment - 05/21/21 1459     Subjective Doing really well and feel ready to be discharged today.  I started feeling a  few twinges in the triceps area like electricity today. I think it is where the nerve is regenerating.Everything is doing better, no longer having pain, but can feel tightness in the chest. I am using my arm more normally now.    Pertinent History Patient was diagnosed on 01/07/2021 with right grade II invasive ductal carcinoma breast cancer. It measures 1 cm with calcifications in the upper outer quadrant. It is ER/PR positive and HER2 negative with a Ki67 of 20%. She has a previous history of right breast DCIS with a lumpectomy and radiation in 2005. Bilateral mastectomies were performed on 02/26/2021 with 0/2 LN's on right and 0/1 LN on left.    Patient Stated Goals Post op assessment/ Be able to reach better                [facility] PT Assessment - 05/21/21 0001       AROM   Right Shoulder  Extension 47 Degrees    Right Shoulder Flexion 155 Degrees    Right Shoulder ABduction 175 Degrees    Right Shoulder External Rotation 106 Degrees    Left Shoulder Extension 50 Degrees    Left Shoulder Flexion 158 Degrees    Left Shoulder ABduction 180 Degrees    Left Shoulder External Rotation 105 Degrees                L-DEX FLOWSHEETS - 05/21/21 1600       L-DEX LYMPHEDEMA SCREENING   Measurement Type Unilateral    L-DEX MEASUREMENT EXTREMITY Upper Extremity    POSITION  Standing    DOMINANT SIDE Right    At Risk Side Right    BASELINE SCORE (UNILATERAL) 0.5    L-DEX SCORE (UNILATERAL) -2.5    VALUE CHANGE (UNILAT) -3                       [facility] Adult PT Treatment/Exercise - 05/21/21 0001       Exercises   Other Exercises  ABC strength class step up to end of handout. 5 reps each side with step up, 2# wts x 10 reps for remaining exs. Showed several balance exercise progressions including step and hold on ax and cone touches.  Instructed in pec doorway stretch and gave pics.      Shoulder Exercises: Standing   Other Standing Exercises standing wall slides flexion and abduction x 5 ea      Manual Therapy   Manual therapy comments small piece of gray foam in TG soft given to pt to place inside cami if it will hold it to try and reduce mild swelling present at left  lateral incision    Manual Lymphatic Drainage (MLD) pt instructed in MLD to left inguinal region, left axillo inguinal pathway and left chest directing to pathway, repeating pathway and ending with LN's and practiced all demonstrating good understanding.                    PT Education - 05/21/21 1602     Education Details doorway pectoralis stretch x 3    Person(s) Educated Patient    Methods Explanation;Demonstration;Handout              PT Short Term Goals - 02/18/21 0854       PT SHORT TERM GOAL #1   Title Patient will be independent with a home exercise program.     Status Achieved  PT Long Term Goals - 05/21/21 1714       PT LONG TERM GOAL #1   Title Patient will demonstrate she has regained full shoulder ROM and function post operatively compared to baselines.    Time 6    Period Weeks    Status Achieved    Target Date 05/21/21      PT LONG TERM GOAL #2   Title Pt will have decreased pain/discomfort from cording on right by 75%    Time 6    Period Weeks    Status Achieved    Target Date 05/05/21      PT LONG TERM GOAL #3   Title Pt will attend ABC class    Time 6    Period Weeks    Status Achieved    Target Date 05/05/21      PT LONG TERM GOAL #4   Title Pts quick dash will improve to no greater than 25%    Time 6    Period Weeks    Status Achieved    Target Date 05/05/21      PT LONG TERM GOAL #5   Title Pt will be independent in a progressive strengthening program for return to normal function    Time 4    Period Weeks    Status Achieved    Target Date 05/21/21      PT LONG TERM GOAL #6   Title Pt will be independent with MLD as needed to decrease swelling    Time 4    Status Achieved    Target Date 05/21/21                   Plan - 05/21/21 1716     Clinical Impression Statement Pt is doing very well and felt ready for discharge.  She is very compliant with her HEP and she has normal ROM and function.  She was instructed in self MLD today to the left trunk and chest using left inguinal region for drainage area. She did very well learning new techique. She is going to [employer] and we discussed she might like to try a compression cami. She was given a small foam pad to place in it if it is strong enought to hold it in. She was taken through the [facility] strength handout over several days and had a good understanding of progression and good form with the exercises. she has achieved all established goals and is discharged. She was advised to call with any questions or concerns.     Stability/Clinical Decision Making Stable/Uncomplicated    Rehab Potential Excellent    PT Frequency 2x / week    PT Duration 4 weeks    PT Treatment/Interventions ADLs/Self Care Home Management;Therapeutic exercise;Patient/family education;Passive range of motion;Manual techniques;Dry needling;Scar mobilization;Manual lymph drainage    PT Next Visit Plan Discharge to HEP    PT Home Exercise Plan 4 post op exercises, NTS, supine dowel exercise, Pec wall stretch, ABC strength    Consulted and Agree with Plan of Care Patient             Patient will benefit from skilled therapeutic intervention in order to improve the following deficits and impairments:  Postural dysfunction, Decreased range of motion, Decreased knowledge of precautions, Impaired UE functional use, Pain, Decreased skin integrity, Increased edema, Impaired sensation, Decreased scar mobility  Visit Diagnosis: Abnormal posture  Chronic right shoulder pain  Localized edema  Stiffness of right shoulder, not  elsewhere classified  Stiffness of left shoulder, not elsewhere classified  Malignant neoplasm of upper-outer quadrant of right breast in female, estrogen receptor positive [facility])     Problem List Patient Active Problem List   Diagnosis Date Noted   S/P bilateral mastectomy 02/26/2021   Genetic testing 02/18/2021   Family history of breast cancer 02/12/2021   Personal history of breast cancer 02/12/2021   Family history of prostate cancer 02/12/2021   Malignant neoplasm of upper-outer quadrant of right breast in female, estrogen receptor positive ([place]) 02/04/2021   CVA (cerebral vascular accident) ([place]) 11/29/2019   Depression 11/29/2019   CKD (chronic kidney disease) stage 3, GFR 30-59 ml/min ([facility]) 11/29/2019   Nausea and vomiting 03/15/2019   Gastroesophageal reflux disease 03/15/2019   Loose stools 03/15/2019   Elevated alkaline phosphatase level 03/15/2019   Herpes progenitalis    Osteopenia     Hypothyroidism    IC (interstitial cystitis)    DUCTAL CARCINOMA IN SITU, RIGHT BREAST 03/12/2008   DIVERTICULOSIS OF COLON 03/12/2008  PHYSICAL THERAPY DISCHARGE SUMMARY  Visits from Start of Care: 15  Current functional level related to goals / functional outcomes: Achieved all goals   Remaining deficits: Mild edema left trunk, mild cording right axilla not interfering with ROM   Education / Equipment: NA   Patient agrees to discharge. Patient goals were met. Patient is being discharged due to meeting the stated rehab goals.  Claris Pong, PT 05/21/2021, 5:22 PM  [place] @ [place] [place], [place], [address] Phone: [phone]   Fax:  [phone]  Name: Jaclyn Lopez MRN: [id number] Date of Birth: 09/02/1950

## 2021-05-22 DIAGNOSIS — C50911 Malignant neoplasm of unspecified site of right female breast: Secondary | ICD-10-CM | POA: Diagnosis not present

## 2021-05-22 DIAGNOSIS — Z9012 Acquired absence of left breast and nipple: Secondary | ICD-10-CM | POA: Diagnosis not present

## 2021-05-25 ENCOUNTER — Ambulatory Visit: Payer: Medicare PPO

## 2021-06-04 ENCOUNTER — Other Ambulatory Visit: Payer: Self-pay | Admitting: Internal Medicine

## 2021-06-28 ENCOUNTER — Other Ambulatory Visit: Payer: Self-pay | Admitting: Internal Medicine

## 2021-07-01 ENCOUNTER — Inpatient Hospital Stay: Payer: Medicare PPO | Attending: Adult Health | Admitting: Adult Health

## 2021-07-01 ENCOUNTER — Other Ambulatory Visit: Payer: Self-pay

## 2021-07-01 ENCOUNTER — Encounter: Payer: Self-pay | Admitting: Adult Health

## 2021-07-01 VITALS — BP 121/45 | HR 78 | Temp 97.7°F | Resp 16 | Ht 66.0 in | Wt 148.4 lb

## 2021-07-01 DIAGNOSIS — N6489 Other specified disorders of breast: Secondary | ICD-10-CM | POA: Insufficient documentation

## 2021-07-01 DIAGNOSIS — Z8673 Personal history of transient ischemic attack (TIA), and cerebral infarction without residual deficits: Secondary | ICD-10-CM | POA: Diagnosis not present

## 2021-07-01 DIAGNOSIS — Z170 Estrogen receptor positive status [ER+]: Secondary | ICD-10-CM | POA: Diagnosis not present

## 2021-07-01 DIAGNOSIS — Z801 Family history of malignant neoplasm of trachea, bronchus and lung: Secondary | ICD-10-CM | POA: Diagnosis not present

## 2021-07-01 DIAGNOSIS — Z8616 Personal history of COVID-19: Secondary | ICD-10-CM | POA: Insufficient documentation

## 2021-07-01 DIAGNOSIS — Z9013 Acquired absence of bilateral breasts and nipples: Secondary | ICD-10-CM | POA: Diagnosis not present

## 2021-07-01 DIAGNOSIS — Z818 Family history of other mental and behavioral disorders: Secondary | ICD-10-CM | POA: Diagnosis not present

## 2021-07-01 DIAGNOSIS — Z87891 Personal history of nicotine dependence: Secondary | ICD-10-CM | POA: Diagnosis not present

## 2021-07-01 DIAGNOSIS — C50411 Malignant neoplasm of upper-outer quadrant of right female breast: Secondary | ICD-10-CM

## 2021-07-01 DIAGNOSIS — M85852 Other specified disorders of bone density and structure, left thigh: Secondary | ICD-10-CM | POA: Diagnosis not present

## 2021-07-01 DIAGNOSIS — Z79899 Other long term (current) drug therapy: Secondary | ICD-10-CM | POA: Insufficient documentation

## 2021-07-01 DIAGNOSIS — Z836 Family history of other diseases of the respiratory system: Secondary | ICD-10-CM | POA: Diagnosis not present

## 2021-07-01 DIAGNOSIS — Z803 Family history of malignant neoplasm of breast: Secondary | ICD-10-CM | POA: Diagnosis not present

## 2021-07-01 DIAGNOSIS — Z8042 Family history of malignant neoplasm of prostate: Secondary | ICD-10-CM | POA: Insufficient documentation

## 2021-07-01 NOTE — Progress Notes (Signed)
SURVIVORSHIP VISIT:   BRIEF ONCOLOGIC HISTORY:  Oncology History  Malignant neoplasm of upper-outer quadrant of right breast in female, estrogen receptor positive ([place])  02/04/2021 Initial Diagnosis   Screening mammogram: focal asymmetry and calcifications in the right breast. Diagnostic mammogram: irregular mass. Biopsy: invasive mammary carcinoma and mammary carcinoma in situ, ER+(90%)/PR+(70%)/Her2-.  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   02/11/2021 Cancer Staging   Staging form: Breast, AJCC 8th Edition - Clinical stage from 02/11/2021: Stage IA (cT1b, cN0, cM0, G2, ER+, PR+, HER2-) - Signed by Nicholas Lose, MD on 02/11/2021 Stage prefix: Initial diagnosis Histologic grading system: 3 grade system Laterality: Right Staged by: Pathologist and managing physician Stage used in treatment planning: Yes National guidelines used in treatment planning: Yes Type of national guideline used in treatment planning: NCCN    02/17/2021 Genetic Testing   Negative hereditary cancer genetic testing: no pathogenic variants detected in [facility] BRCAPlus Panel or CancerNext-Expanded +RNAinsight Panel.  The report dates are February 17, 2021 and February 25, 2021, respectively.   The BRCAplus panel offered by [employer] and includes sequencing and deletion/duplication analysis for the following 8 genes: ATM, BRCA1, BRCA2, CDH1, CHEK2, PALB2, PTEN, and TP53.  The CancerNext-Expanded gene panel offered by [facility] and includes sequencing, rearrangement, and RNA analysis for the following 77 genes: AIP, ALK, APC, ATM, AXIN2, BAP1, BARD1, BLM, BMPR1A, BRCA1, BRCA2, BRIP1, CDC73, CDH1, CDK4, CDKN1B, CDKN2A, CHEK2, CTNNA1, DICER1, FANCC, FH, FLCN, GALNT12, KIF1B, LZTR1, MAX, MEN1, MET, MLH1, MSH2, MSH3, MSH6, MUTYH, NBN, NF1, NF2, NTHL1, PALB2, PHOX2B, PMS2, POT1, PRKAR1A, PTCH1, PTEN, RAD51C, RAD51D, RB1, RECQL, RET, SDHA, SDHAF2, SDHB, SDHC, SDHD, SMAD4, SMARCA4, SMARCB1, SMARCE1, STK11,  SUFU, TMEM127, TP53, TSC1, TSC2, VHL and XRCC2 (sequencing and deletion/duplication); EGFR, EGLN1, HOXB13, KIT, MITF, PDGFRA, POLD1, and POLE (sequencing only); EPCAM and GREM1 (deletion/duplication only).    02/26/2021 Surgery   Left mastectomy: ALH, 1 lymph node benign Right mastectomy: Grade 2 IDC, 2.3 cm, intermediate grade DCIS, margins negative, 0/2 lymph nodes negative ER 90%, PR 70%, HER2 negative, Ki-67 20%     INTERVAL HISTORY:  Jaclyn Lopez to review her survivorship care plan detailing her treatment course for breast cancer, as well as monitoring long-term side effects of that treatment, education regarding health maintenance, screening, and overall wellness and health promotion.     Overall, Jaclyn Lopez reports feeling quite well.  She continues on Letrozole with good tolerance.  She denies any significant issues today.  She notes some cording and has been f/u with PT for this.    REVIEW OF SYSTEMS:  Review of Systems  Constitutional:  Negative for appetite change, chills, fatigue, fever and unexpected weight change.  HENT:   Negative for hearing loss, lump/mass and trouble swallowing.   Eyes:  Negative for eye problems and icterus.  Respiratory:  Negative for chest tightness, cough and shortness of breath.   Cardiovascular:  Negative for chest pain, leg swelling and palpitations.  Gastrointestinal:  Negative for abdominal distention, abdominal pain, constipation, diarrhea, nausea and vomiting.  Endocrine: Negative for hot flashes.  Genitourinary:  Negative for difficulty urinating.   Musculoskeletal:  Negative for arthralgias.  Skin:  Negative for itching and rash.  Neurological:  Negative for dizziness, extremity weakness, headaches and numbness.  Hematological:  Negative for adenopathy. Does not bruise/bleed easily.  Psychiatric/Behavioral:  Negative for depression. The patient is not nervous/anxious.   Breast: Denies any new nodularity, masses, tenderness, nipple changes, or  nipple discharge.      ONCOLOGY  TREATMENT TEAM:  1. Surgeon:  Dr. Donne Hazel at [facility] 2. Medical Oncologist: Dr. 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 Subsequent LEEP showed CIN-2 with clear margins and negative ECC   Osteopenia 11/2018   T score -1.5 FRAX 9% / 1% improved at both hips stable at spine   Personal history of breast cancer 02/12/2021   PFO (patent foramen ovale)    PONV (postoperative nausea and vomiting)    Stroke ([place]) 11/2019   Thyroid disease    Hyperthyroid   Past Surgical History:  Procedure Laterality Date   Bladder stretch and Bx  1990   BREAST LUMPECTOMY  2005   right; cancer   BREAST LUMPECTOMY  2006   left; pre cancer   BREAST SURGERY     Reduction   BUNIONECTOMY  2007, 2011   CATARACT EXTRACTION  06/17/2020   CERVICAL CONE BIOPSY  2003   CESAREAN SECTION  1983   DILATION AND CURETTAGE OF UTERUS  2009   ENDOMETRIAL ABLATION     Novasure   GANGLION CYST EXCISION Left 1985   HYSTEROSCOPY  2009   LEEP  05/2015   CIN-2 with clear margins   MASTECTOMY W/ SENTINEL NODE BIOPSY Right 02/26/2021   Procedure: RIGHT MASTECTOMY WITH AXILLARY SENTINEL LYMPH NODE BIOPSY;   Surgeon: Rolm Bookbinder, MD;  Location: [facility];  Service: General;  Laterality: Right;   MASTECTOMY, PARTIAL     MOUTH SURGERY  2012   implants   TOTAL MASTECTOMY Left 02/26/2021   Procedure: LEFT TOTAL MASTECTOMY;  Surgeon: Rolm Bookbinder, MD;  Location: [facility];  Service: General;  Laterality: Left;   TUBAL LIGATION  1984     ALLERGIES:  Allergies  Allergen Reactions   Symmetrel [Amantadine Hcl] Hives   Tamiflu [Oseltamivir Phosphate] Hives     CURRENT MEDICATIONS:  Outpatient Encounter Medications as of 07/01/2021  Medication Sig Note   aspirin EC 81 MG EC tablet Take 1 tablet (81 mg total) by mouth daily. Swallow whole.    atorvastatin (LIPITOR) 40 MG tablet 1 tablet    Biotin 5 MG CAPS Take 5 mg by mouth daily.    buPROPion (WELLBUTRIN XL) 150 MG 24 hr tablet Take 150 mg by mouth every morning.    calcium carbonate (TUMS - DOSED IN MG ELEMENTAL CALCIUM) 500 MG chewable tablet Chew 1 tablet by mouth daily as needed for indigestion or heartburn.    cholecalciferol (VITAMIN D3) 25 MCG (1000 UNIT) tablet Take 1,000 Units by mouth daily.    FLUoxetine (PROZAC) 20 MG capsule Take 20 mg by mouth daily.    gabapentin (NEURONTIN) 300  MG capsule Take 1 capsule (300 mg total) by mouth at bedtime.    ibuprofen (ADVIL) 200 MG tablet Take 200 mg by mouth every 6 (six) hours as needed for headache or moderate pain.    letrozole (FEMARA) 2.5 MG tablet Take 1 tablet (2.5 mg total) by mouth daily.    levothyroxine (SYNTHROID, LEVOTHROID) 50 MCG tablet Take 50 mcg by mouth daily.    Methen-Hyosc-Meth Blue-Na Phos (ME/NAPHOS/MB/HYO1) 81.6 MG TABS Take 1 tablet by mouth daily as needed (urinary pain). 07/01/2021: Pt takes PRN   mirtazapine (REMERON) 15 MG tablet Take 3.75 mg by mouth at bedtime.  03/26/2020: 1/4 tab   Omega-3 Fatty Acids (FISH OIL) 1000 MG CAPS Take by mouth.    omeprazole (PRILOSEC) 40 MG capsule TAKE 1 CAPSULE (40 MG TOTAL) BY MOUTH  DAILY AS NEEDED (ACID REFLUX). PLEASE SCHEDULE A YEARLY OFFICE VISIT FOR FURTHER REFILLS. THANK YOU    traMADol (ULTRAM) 50 MG tablet Take 2 tablets (100 mg total) by mouth every 6 (six) hours as needed.    zaleplon (SONATA) 10 MG capsule Take 10 mg by mouth at bedtime as needed for sleep.    No facility-administered encounter medications on file as of 07/01/2021.     ONCOLOGIC FAMILY HISTORY:  Family History  Problem Relation Age of Onset   COPD Mother    Dementia Mother    Bipolar disorder Mother    Lung cancer Father 96       smoking hx   Prostate cancer Maternal Uncle        dx > 50   Breast cancer Maternal Grandmother 90   Colon cancer Neg Hx    Stomach cancer Neg Hx    Pancreatic cancer Neg Hx    Esophageal cancer Neg Hx    Rectal cancer Neg Hx      GENETIC COUNSELING/TESTING: See above  SOCIAL HISTORY:  Social History   Socioeconomic History   Marital status: Divorced    Spouse name: Not on file   Number of children: 2   Years of education: Not on file   Highest education level: Not on file  Occupational History   Occupation: Pharmacist, hospital    Comment: [employer]  Tobacco Use   Smoking status: Former    Types: Cigarettes    Quit date: 05/17/1996    Years since quitting: 25.1   Smokeless tobacco: Never  Vaping Use   Vaping Use: Never used  Substance and Sexual Activity   Alcohol use: Not Currently    Comment: once a month   Drug use: No   Sexual activity: Not Currently    Birth control/protection: Surgical    Comment: 1st intercourse 71 yo-More than 5 partners  Other Topics Concern   Not on file  Social History Narrative   Divorced   rare EtOH, no tobacco, drugs   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  Oropharynx clear and moist. No ulcerations or evidence of oropharyngeal candidiasis. Neck is supple.  NODES:  No cervical, supraclavicular, or axillary lymphadenopathy palpated.  BREAST EXAM:  s/p bilateral mastectomies, no sign of local recurrence.  LUNGS:  Clear to auscultation bilaterally.  No wheezes or rhonchi. HEART:  Regular rate and rhythm. No murmur appreciated. ABDOMEN:  Soft, nontender.  Positive, normoactive bowel sounds. No organomegaly palpated. MSK:  No focal spinal tenderness to palpation. Full range of motion bilaterally in the upper extremities. EXTREMITIES:  No peripheral edema.   SKIN:  Clear with no obvious rashes or skin changes. No nail dyscrasia. NEURO:  Nonfocal. Well oriented.  Appropriate affect.   LABORATORY DATA:  None for this visit.  DIAGNOSTIC IMAGING:  None for this visit.      ASSESSMENT AND PLAN:  Ms.. Gilford Lopez is a pleasant 71 y.o. female with Stage 1A right breast invasive ductal carcinoma, ER+/PR+/HER2-, diagnosed in September 2022, treated with bilateral mastectomies and anti-estrogen therapy with letrozole beginning in November 2022.  She presents to the Survivorship Clinic for our initial meeting and routine follow-up post-completion of treatment for breast cancer.    1. Stage 1A right breast cancer:  Jaclyn Lopez is continuing to recover from definitive treatment for breast cancer. She will follow-up with her medical oncologist, Dr. Cleda Daub 6 months with history and physical exam per surveillance protocol.  She will continue her anti-estrogen therapy with letrozole. Thus far, she is tolerating the  letrozole well, with minimal side effects. She was instructed to make Dr. Lindi Adie or myself aware if she begins to experience any worsening side effects of the medication and I could see her back in clinic to help manage those side effects, as needed.   Today, a comprehensive survivorship care plan and treatment summary was reviewed with the patient today detailing her breast cancer diagnosis, treatment course, potential late/long-term effects of treatment, appropriate follow-up care with recommendations for the future, and patient education resources.  A copy of this summary, along with a letter will be sent to the patients primary care provider via mail/fax/In Basket message after todays visit.    2.  Cording: This has improved.  She will continue to follow-up with physical therapy as needed.  3. Bone health:  Given Jaclyn Lopez's age/history of breast cancer and her current treatment regimen including anti-estrogen therapy with letrozole, she is at risk for bone demineralization.  Her last DEXA scan was in August 2022 and showed osteopenia with a T score of -1.6 in the left femur.  We discussed that these are indicated every 2 years especially while taking letrozole which can further decrease bone density. She was given education on specific activities to promote bone health.  4. Cancer screening:  Due to Jaclyn Lopez's history and her age, she should receive screening for skin cancers, colon cancer, and gynecologic cancers.  The information and recommendations are listed on the patient's comprehensive care plan/treatment summary and were reviewed in detail with the patient.    5. Health maintenance and wellness promotion: Jaclyn Lopez was encouraged to consume 5-7 servings of fruits and vegetables per day. We reviewed the "Nutrition Rainbow" handout,.  She was also encouraged to engage in moderate to vigorous exercise for 30 minutes per day most days of the week. We discussed the [employer] fitness  program, which is designed for cancer  survivors to help them become more physically fit after cancer treatments.  She was instructed to limit her alcohol consumption and continue to abstain from tobacco use.     6. Support services/counseling: It is not uncommon for this period of the patient's cancer care trajectory to be one of many emotions and stressors.  She was given information regarding our available services and encouraged to contact me with any questions or for help enrolling in any of our support group/programs.    Follow up instructions:    -Return to cancer center in 6 months for follow-up with Dr. Lindi Adie -Bone density due 12/2022 -Follow up with surgery in 1 year -She is welcome to return back to the Survivorship Clinic at any time; no additional follow-up needed at this time.  -Consider referral back to survivorship as a long-term survivor for continued surveillance  The patient was provided an opportunity to ask questions and all were answered. The patient agreed with the plan and demonstrated an understanding of the instructions.   Total encounter time: 45 minutes in face-to-face visit time, chart review, lab review, care coordination, order entry, and documentation of the encounter.  Jaclyn Bihari, NP 07/01/21 7:26 PM Medical Oncology and Hematology [facility] [place], [place] [address] Tel. [phone]    Fax. [phone]  *Total Encounter Time as defined by the Centers for Medicare and Medicaid Services includes, in addition to the face-to-face time of a patient visit (documented in the note above) non-face-to-face time: obtaining and reviewing outside history, ordering and reviewing medications, tests or procedures, care coordination (communications with other health care professionals or caregivers) and documentation in the medical record.

## 2021-08-18 ENCOUNTER — Other Ambulatory Visit: Payer: Self-pay | Admitting: Internal Medicine

## 2021-09-12 ENCOUNTER — Other Ambulatory Visit: Payer: Self-pay | Admitting: Internal Medicine

## 2021-09-27 ENCOUNTER — Other Ambulatory Visit: Payer: Self-pay | Admitting: Internal Medicine

## 2021-10-28 DIAGNOSIS — R5383 Other fatigue: Secondary | ICD-10-CM | POA: Diagnosis not present

## 2021-10-28 DIAGNOSIS — E039 Hypothyroidism, unspecified: Secondary | ICD-10-CM | POA: Diagnosis not present

## 2021-12-13 NOTE — Progress Notes (Signed)
Patient Care Team: Deland Pretty, MD as PCP - General (Internal Medicine) Rolm Bookbinder, MD as Consulting Physician (General Surgery) Nicholas Lose, MD as Consulting Physician (Hematology and Oncology) Gatha Mayer, MD as Consulting Physician (Gastroenterology) Frann Rider, NP as Nurse Practitioner (Neurology) Tamela Gammon, NP as Nurse Practitioner (Gynecology)  DIAGNOSIS:  Encounter Diagnosis  Name Primary?   Malignant neoplasm of upper-outer quadrant of right breast in female, estrogen receptor positive ([place])     SUMMARY OF ONCOLOGIC HISTORY: Oncology History  Malignant neoplasm of upper-outer quadrant of right breast in female, estrogen receptor positive ([place])  02/04/2021 Initial Diagnosis   Screening mammogram: focal asymmetry and calcifications in the right breast. Diagnostic mammogram: irregular mass. Biopsy: invasive mammary carcinoma and mammary carcinoma in situ, ER+(90%)/PR+(70%)/Her2-.  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   02/11/2021 Cancer Staging   Staging form: Breast, AJCC 8th Edition - Clinical stage from 02/11/2021: Stage IA (cT1b, cN0, cM0, G2, ER+, PR+, HER2-) - Signed by Nicholas Lose, MD on 02/11/2021 Stage prefix: Initial diagnosis Histologic grading system: 3 grade system Laterality: Right Staged by: Pathologist and managing physician Stage used in treatment planning: Yes National guidelines used in treatment planning: Yes Type of national guideline used in treatment planning: NCCN   02/17/2021 Genetic Testing   Negative hereditary cancer genetic testing: no pathogenic variants detected in [facility] BRCAPlus Panel or CancerNext-Expanded +RNAinsight Panel.  The report dates are February 17, 2021 and February 25, 2021, respectively.   The BRCAplus panel offered by [employer] and includes sequencing and deletion/duplication analysis for the following 8 genes: ATM, BRCA1, BRCA2, CDH1, CHEK2, PALB2, PTEN, and TP53.  The  CancerNext-Expanded gene panel offered by [facility] and includes sequencing, rearrangement, and RNA analysis for the following 77 genes: AIP, ALK, APC, ATM, AXIN2, BAP1, BARD1, BLM, BMPR1A, BRCA1, BRCA2, BRIP1, CDC73, CDH1, CDK4, CDKN1B, CDKN2A, CHEK2, CTNNA1, DICER1, FANCC, FH, FLCN, GALNT12, KIF1B, LZTR1, MAX, MEN1, MET, MLH1, MSH2, MSH3, MSH6, MUTYH, NBN, NF1, NF2, NTHL1, PALB2, PHOX2B, PMS2, POT1, PRKAR1A, PTCH1, PTEN, RAD51C, RAD51D, RB1, RECQL, RET, SDHA, SDHAF2, SDHB, SDHC, SDHD, SMAD4, SMARCA4, SMARCB1, SMARCE1, STK11, SUFU, TMEM127, TP53, TSC1, TSC2, VHL and XRCC2 (sequencing and deletion/duplication); EGFR, EGLN1, HOXB13, KIT, MITF, PDGFRA, POLD1, and POLE (sequencing only); EPCAM and GREM1 (deletion/duplication only).    02/26/2021 Surgery   Left mastectomy: ALH, 1 lymph node benign Right mastectomy: Grade 2 IDC, 2.3 cm, intermediate grade DCIS, margins negative, 0/2 lymph nodes negative ER 90%, PR 70%, HER2 negative, Ki-67 20%   03/16/2021 Oncotype testing   Oncotype DX score 19: Distant recurrence at 9 years: 6%     CHIEF COMPLIANT: Follow-up of right breast cancer on letrozole    INTERVAL HISTORY: Jaclyn Lopez is a  71 y.o. with above-mentioned history of right breast cancer. She presents to the clinic today for a follow-up. She states that she is doing very well. She feels like she is gaining weight. She had some concerns about the letrozole if she had to stay on it or not. She complains of numbness under armpit, have not got any feeling back. She states that her range of motion is fine. She just have some burning sensation.   ALLERGIES:  is allergic to symmetrel [amantadine hcl] and tamiflu [oseltamivir phosphate].  MEDICATIONS:  Current Outpatient Medications  Medication Sig Dispense Refill   acyclovir (ZOVIRAX) 200 MG capsule Take 1 capsule (200 mg total) by mouth daily. 30 capsule 2   aspirin EC 81 MG EC tablet Take 1  tablet (81 mg total) by mouth daily. Swallow  whole. 30 tablet 3   atorvastatin (LIPITOR) 40 MG tablet 1 tablet     Biotin 5 MG CAPS Take 5 mg by mouth daily.     buPROPion (WELLBUTRIN XL) 150 MG 24 hr tablet Take 150 mg by mouth every morning.     calcium carbonate (TUMS - DOSED IN MG ELEMENTAL CALCIUM) 500 MG chewable tablet Chew 1 tablet by mouth daily as needed for indigestion or heartburn.     cholecalciferol (VITAMIN D3) 25 MCG (1000 UNIT) tablet Take 1,000 Units by mouth daily.     DENTA 5000 PLUS 1.1 % CREA dental cream Take by mouth.     FLUoxetine (PROZAC) 20 MG capsule Take 20 mg by mouth daily.     ibuprofen (ADVIL) 200 MG tablet Take 200 mg by mouth every 6 (six) hours as needed for headache or moderate pain.     letrozole (FEMARA) 2.5 MG tablet Take 1 tablet (2.5 mg total) by mouth daily. 90 tablet 3   levothyroxine (SYNTHROID, LEVOTHROID) 50 MCG tablet Take 50 mcg by mouth daily.     Methen-Hyosc-Meth Blue-Na Phos (ME/NAPHOS/MB/HYO1) 81.6 MG TABS Take 1 tablet by mouth daily as needed (urinary pain).     mirtazapine (REMERON) 15 MG tablet Take 3.75 mg by mouth at bedtime.      omeprazole (PRILOSEC) 40 MG capsule TAKE 1 CAPSULE DAILY AS NEEDED (ACID REFLUX). PLEASE SCHEDULE A YEARLY OFFICE VISIT FOR REFILLS 90 capsule 0   zaleplon (SONATA) 10 MG capsule Take 10 mg by mouth at bedtime as needed for sleep.     No current facility-administered medications for this visit.    PHYSICAL EXAMINATION: ECOG PERFORMANCE STATUS: 1 - Symptomatic but completely ambulatory  Vitals:   12/21/21 1108  BP: 115/63  Pulse: 76  Resp: 17  Temp: (!) 97.5 F (36.4 C)  SpO2: 98%   Filed Weights   12/21/21 1108  Weight: 148 lb 4.8 oz (67.3 kg)    BREAST: Bilateral mastectomies.  Scar tissues without any palpable lumps or nodules.  No axillary lymphadenopathy (exam performed in the presence of a chaperone)  LABORATORY DATA:  I have reviewed the data as listed    Latest Ref Rng & Units 02/11/2021   12:25 PM 08/19/2020   10:20 AM  05/05/2020    1:53 PM  CMP  Glucose 70 - 99 mg/dL 103     BUN 8 - 23 mg/dL 19     Creatinine 0.44 - 1.00 mg/dL 1.14     Sodium 135 - 145 mmol/L 138     Potassium 3.5 - 5.1 mmol/L 4.5     Chloride 98 - 111 mmol/L 104     CO2 22 - 32 mmol/L 24     Calcium 8.9 - 10.3 mg/dL 9.5     Total Protein 6.5 - 8.1 g/dL 7.0  6.6  6.8   Total Bilirubin 0.3 - 1.2 mg/dL 0.7  0.6  0.5   Alkaline Phos 38 - 126 U/L 116  103  120   AST 15 - 41 U/L _0 ALT 0 - 44 U/L _1 Lab Results  Component Value Date   WBC 7.8 02/11/2021   HGB 13.5 02/11/2021   HCT 40.1 02/11/2021   MCV 87.0 02/11/2021   PLT 206 02/11/2021   NEUTROABS 5.1 02/11/2021    ASSESSMENT & PLAN:  Malignant neoplasm of upper-outer  quadrant of right breast in female, estrogen receptor positive ([place]) Bilateral mastectomies 02/26/2021 Left mastectomy: ALH, 1 lymph node benign Right mastectomy: Grade 2 IDC, 2.3 cm, intermediate grade DCIS, margins negative, 0/2 lymph nodes negative ER 90%, PR 70%, HER2 negative, Ki-67 20% Oncotype DX score 19: Distant recurrence at 9 years: 6%  Current treatment: Letrozole started November 2022 Letrozole toxicities:  Weight gain: She attributes this to letrozole Otherwise tolerating it well. She was wondering how much benefit she would get from the antiestrogen therapy.  I explained to her about the risks and benefits of antiestrogen therapy.  Breast cancer surveillance: 1.  Breast exam 12/21/2021: Benign 2. no role of imaging study since she had bilateral mastectomies She got married in May and is very happy with her life.  Return to clinic in 1 year for follow-up  No orders of the defined types were placed in this encounter.  The patient has a good understanding of the overall plan. she agrees with it. she will call with any problems that may develop before the next visit here. Total time spent: 30 mins including face to face time and time spent for planning, charting and  co-ordination of care   Harriette Ohara, MD 12/21/21    I Gardiner Coins am scribing for Dr. Lindi Adie  I have reviewed the above documentation for accuracy and completeness, and I agree with the above.

## 2021-12-15 ENCOUNTER — Ambulatory Visit (INDEPENDENT_AMBULATORY_CARE_PROVIDER_SITE_OTHER): Payer: Medicare PPO | Admitting: Nurse Practitioner

## 2021-12-15 ENCOUNTER — Encounter: Payer: Self-pay | Admitting: Nurse Practitioner

## 2021-12-15 VITALS — BP 108/68 | HR 77 | Ht 65.5 in | Wt 148.0 lb

## 2021-12-15 DIAGNOSIS — B009 Herpesviral infection, unspecified: Secondary | ICD-10-CM | POA: Diagnosis not present

## 2021-12-15 DIAGNOSIS — Z170 Estrogen receptor positive status [ER+]: Secondary | ICD-10-CM

## 2021-12-15 DIAGNOSIS — Z01419 Encounter for gynecological examination (general) (routine) without abnormal findings: Secondary | ICD-10-CM

## 2021-12-15 DIAGNOSIS — M8589 Other specified disorders of bone density and structure, multiple sites: Secondary | ICD-10-CM

## 2021-12-15 DIAGNOSIS — Z780 Asymptomatic menopausal state: Secondary | ICD-10-CM | POA: Diagnosis not present

## 2021-12-15 DIAGNOSIS — Z9189 Other specified personal risk factors, not elsewhere classified: Secondary | ICD-10-CM | POA: Diagnosis not present

## 2021-12-15 DIAGNOSIS — C50411 Malignant neoplasm of upper-outer quadrant of right female breast: Secondary | ICD-10-CM

## 2021-12-15 MED ORDER — ACYCLOVIR 200 MG PO CAPS: 200.0000 mg | ORAL_CAPSULE | Freq: Every day | ORAL | 2 refills | Status: DC

## 2021-12-15 NOTE — Progress Notes (Signed)
Jaclyn Lopez 1950/11/02 [id number]   History:  71 y.o. G2 P2 presents for breast and pelvic exam without GYN complaints. Postmenopausal - no HRT, no bleeding. 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 Abnormal pap history (see below). Takes Acyclovir weekly for HSV, rare outbreaks. Followed by urology for interstitial cystitis.   2014 negative cytology positive HPV 2015 negative cytology positive HPV, colposcopy normal 2016 normal cytology positive HPV negative 16/18/45, colposcopy showed LGSIL 2017 LEEP HGSIL, gladular involvement, clear margins  Gynecologic History No LMP recorded. Patient is postmenopausal.   Contraception: post menopausal status Sexually active: Yes  Health maintenance Last Pap: 11/21/2019. Results were: Normal, 3-year repeat Last mammogram: 02/04/2021. Results were: Right breast mass - malignant Last colonoscopy: 05/2019. Results were: benign polyps, 5-year recall Last Dexa: 12/16/2020 Results were: T-score -1.6, FRAX 9.8% / 1.4%  Past medical history, past surgical history, family history and social history were all reviewed and documented in the EPIC chart. Got married in May. Retired. 2 children, oldest lives here with 2 grandchildren. Husband has 2 children - one son here who is ortho Psychologist, sport and exercise, other in [place].   ROS:  A ROS was performed and pertinent positives and negatives are included.  Exam:  Vitals:   12/15/21 1140  BP: 108/68  Pulse: 77  SpO2: 98%  Weight: 148 lb (67.1 kg)  Height: 5' 5.5" (1.664 m)     Body mass index is 24.25 kg/m.  General appearance:  Normal Thyroid:  Symmetrical, normal in size, without palpable masses or nodularity. Respiratory  Auscultation:  Clear without wheezing or rhonchi Cardiovascular  Auscultation:   Regular rate, without rubs, murmurs or gallops  Edema/varicosities:  Not grossly evident Abdominal  Soft,nontender, without masses, guarding or rebound.  Liver/spleen:  No organomegaly noted  Hernia:  None appreciated  Skin  Inspection:  Grossly normal   Breasts: Double mastectomy Genitourinary   Inguinal/mons:  Normal without inguinal adenopathy  External genitalia:  Normal appearing vulva with no masses, tenderness, or lesions  BUS/Urethra/Skene's glands:  Normal  Vagina:  Normal appearing with normal color and discharge, no lesions. Atrophic changes  Cervix:  Normal appearing without discharge or lesions  Uterus:  Normal in size, shape and contour.  Midline and mobile, nontender  Adnexa/parametria:     Rt: Normal in size, without masses or tenderness.   Lt: Normal in size, without masses or tenderness.  Anus and perineum: Normal  Digital rectal exam: Normal sphincter tone without palpated masses or tenderness  Patient informed chaperone available to be present for breast and pelvic exam. Patient has requested no chaperone to be present. Patient has been advised what will be completed during breast and pelvic exam.   Assessment/Plan:  71 y.o. G2 P2 for annual exam.   Well female exam with routine gynecological exam - Education provided on SBEs, importance of preventative screenings, current guidelines, high calcium diet, regular exercise, and multivitamin daily. Labs with PCP.   Osteopenia of multiple sites - T-score -1.6. Continue calcium and vitamin D supplement and regular exercise. Will repeat DXA next year.   HSV-2 (herpes simplex virus 2) infection - Plan: acyclovir (ZOVIRAX) 200 MG tablet weekly. Discussed stopping since no outbreaks with infrequent use and she will consider.   Postmenopausal - No HRT, no bleeding  Screening for cervical cancer - 2014 negative cytology  positive HPV, 2015 negative cytology positive HPV, colposcopy normal. 2016 normal cytology positive HPV  negative 16/18/45, colposcopy showed LGSIL. LEEP 2017 HGSIL, gladular involvement, clear margins. Subsequent paps normal. Guidelines recommend returning to 3-year interval. Due 2024.   Screening for colon cancer - 2021 colonoscopy. Will repeat at GI's recommended interval.   Follow up in 1 year for annual.      Tamela Gammon [facility], 12:00 PM 12/15/2021

## 2021-12-21 ENCOUNTER — Inpatient Hospital Stay: Payer: Medicare PPO | Attending: Hematology and Oncology | Admitting: Hematology and Oncology

## 2021-12-21 DIAGNOSIS — C50411 Malignant neoplasm of upper-outer quadrant of right female breast: Secondary | ICD-10-CM | POA: Diagnosis not present

## 2021-12-21 DIAGNOSIS — Z170 Estrogen receptor positive status [ER+]: Secondary | ICD-10-CM | POA: Diagnosis not present

## 2021-12-21 DIAGNOSIS — Z79811 Long term (current) use of aromatase inhibitors: Secondary | ICD-10-CM | POA: Diagnosis not present

## 2021-12-21 NOTE — Assessment & Plan Note (Addendum)
Bilateral mastectomies 02/26/2021 Left mastectomy: ALH, 1 lymph node benign Right mastectomy: Grade 2 IDC, 2.3 cm, intermediate grade DCIS, margins negative, 0/2 lymph nodes negative ER 90%, PR 70%, HER2 negative, Ki-67 20% Oncotype DX score 19: Distant recurrence at 9 years: 6%  Current treatment: Letrozole started November 2022 Letrozole toxicities:  Weight gain: She attributes this to letrozole Otherwise tolerating it well. She was wondering how much benefit she would get from the antiestrogen therapy.  I explained to her about the risks and benefits of antiestrogen therapy.  Breast cancer surveillance: 1.  Breast exam 12/21/2021: Benign 2. no role of imaging study since she had bilateral mastectomies 

## 2022-01-04 ENCOUNTER — Ambulatory Visit: Payer: Medicare PPO | Attending: General Surgery | Admitting: Physical Therapy

## 2022-02-09 DIAGNOSIS — R748 Abnormal levels of other serum enzymes: Secondary | ICD-10-CM | POA: Diagnosis not present

## 2022-02-09 DIAGNOSIS — D72829 Elevated white blood cell count, unspecified: Secondary | ICD-10-CM | POA: Diagnosis not present

## 2022-02-11 DIAGNOSIS — E041 Nontoxic single thyroid nodule: Secondary | ICD-10-CM | POA: Diagnosis not present

## 2022-02-11 DIAGNOSIS — C50411 Malignant neoplasm of upper-outer quadrant of right female breast: Secondary | ICD-10-CM | POA: Diagnosis not present

## 2022-02-11 DIAGNOSIS — F3340 Major depressive disorder, recurrent, in remission, unspecified: Secondary | ICD-10-CM | POA: Diagnosis not present

## 2022-02-11 DIAGNOSIS — G4700 Insomnia, unspecified: Secondary | ICD-10-CM | POA: Diagnosis not present

## 2022-02-11 DIAGNOSIS — N1832 Chronic kidney disease, stage 3b: Secondary | ICD-10-CM | POA: Diagnosis not present

## 2022-02-11 DIAGNOSIS — K219 Gastro-esophageal reflux disease without esophagitis: Secondary | ICD-10-CM | POA: Diagnosis not present

## 2022-02-11 DIAGNOSIS — N3010 Interstitial cystitis (chronic) without hematuria: Secondary | ICD-10-CM | POA: Diagnosis not present

## 2022-02-11 DIAGNOSIS — Z23 Encounter for immunization: Secondary | ICD-10-CM | POA: Diagnosis not present

## 2022-02-11 DIAGNOSIS — Z0000 Encounter for general adult medical examination without abnormal findings: Secondary | ICD-10-CM | POA: Diagnosis not present

## 2022-02-12 ENCOUNTER — Other Ambulatory Visit: Payer: Self-pay | Admitting: Internal Medicine

## 2022-02-12 ENCOUNTER — Other Ambulatory Visit (HOSPITAL_COMMUNITY): Payer: Self-pay | Admitting: Internal Medicine

## 2022-02-12 DIAGNOSIS — Z8673 Personal history of transient ischemic attack (TIA), and cerebral infarction without residual deficits: Secondary | ICD-10-CM

## 2022-02-24 ENCOUNTER — Other Ambulatory Visit: Payer: Self-pay | Admitting: Home Modifications

## 2022-02-24 DIAGNOSIS — E041 Nontoxic single thyroid nodule: Secondary | ICD-10-CM | POA: Diagnosis not present

## 2022-02-24 DIAGNOSIS — E039 Hypothyroidism, unspecified: Secondary | ICD-10-CM | POA: Diagnosis not present

## 2022-02-24 DIAGNOSIS — E032 Hypothyroidism due to medicaments and other exogenous substances: Secondary | ICD-10-CM | POA: Diagnosis not present

## 2022-02-24 DIAGNOSIS — Z9889 Other specified postprocedural states: Secondary | ICD-10-CM | POA: Diagnosis not present

## 2022-02-24 DIAGNOSIS — R5383 Other fatigue: Secondary | ICD-10-CM | POA: Diagnosis not present

## 2022-02-25 ENCOUNTER — Ambulatory Visit
Admission: RE | Admit: 2022-02-25 | Discharge: 2022-02-25 | Disposition: A | Discharge status: Home / Self Care | Payer: Medicare PPO | Source: Ambulatory Visit | Attending: Home Modifications | Admitting: Home Modifications

## 2022-02-25 DIAGNOSIS — E041 Nontoxic single thyroid nodule: Secondary | ICD-10-CM | POA: Diagnosis not present

## 2022-03-03 ENCOUNTER — Ambulatory Visit (HOSPITAL_COMMUNITY)
Admission: RE | Admit: 2022-03-03 | Discharge: 2022-03-03 | Disposition: A | Discharge status: Home / Self Care | Payer: Medicare PPO | Source: Ambulatory Visit | Attending: Internal Medicine | Admitting: Internal Medicine

## 2022-03-03 DIAGNOSIS — Z8673 Personal history of transient ischemic attack (TIA), and cerebral infarction without residual deficits: Secondary | ICD-10-CM | POA: Insufficient documentation

## 2022-03-26 DIAGNOSIS — E032 Hypothyroidism due to medicaments and other exogenous substances: Secondary | ICD-10-CM | POA: Diagnosis not present

## 2022-03-29 DIAGNOSIS — Z9012 Acquired absence of left breast and nipple: Secondary | ICD-10-CM | POA: Diagnosis not present

## 2022-03-29 DIAGNOSIS — C50911 Malignant neoplasm of unspecified site of right female breast: Secondary | ICD-10-CM | POA: Diagnosis not present

## 2022-03-31 ENCOUNTER — Ambulatory Visit: Payer: Medicare PPO | Admitting: Physician Assistant

## 2022-04-04 ENCOUNTER — Other Ambulatory Visit: Payer: Self-pay | Admitting: Hematology and Oncology

## 2022-04-14 DIAGNOSIS — Z23 Encounter for immunization: Secondary | ICD-10-CM | POA: Diagnosis not present

## 2022-04-14 DIAGNOSIS — E039 Hypothyroidism, unspecified: Secondary | ICD-10-CM | POA: Diagnosis not present

## 2022-04-14 DIAGNOSIS — E663 Overweight: Secondary | ICD-10-CM | POA: Diagnosis not present

## 2022-04-14 DIAGNOSIS — E7800 Pure hypercholesterolemia, unspecified: Secondary | ICD-10-CM | POA: Diagnosis not present

## 2022-04-14 NOTE — Progress Notes (Signed)
04/21/2022 Jaclyn Lopez [id number] Jul 22, 1950  Referring provider: Deland Pretty, MD Primary GI doctor: Dr. Carlean Purl  ASSESSMENT AND PLAN:   Abdominal bloating, concern about weight gain since mastectomy, feels AB larger, previous elevated alk phos with unremarkable workup 2021 Previous cholelithiasis without evidence of cholecystitis, no ductal dilatation, status post liver biopsy 03/28/2020 negative. Will proceed with abdominal ultrasound to evaluate further Try switching fiber to Citrucel Question if from recent mastectomy/surgery Consider SIBO testing and or xifaxin trial for IBS  Gastroesophageal reflux disease without esophagitis with history 5 cm hiatal hernia 2021 EGD unremarkable Doing well on omeprazole 40 mg daily, can take every other day or do omeprazole 20 mg daily Stile discussed. Avoid NSAIDs, alcohol  Loose stools Improved on fiber, never tried Creon. Consider Xifaxan trial versus SIBO testing next office visit.  History of adenomatous polyps Recall 05/2024  Patient Care Team: Deland Pretty, MD as PCP - General (Internal Medicine) Rolm Bookbinder, MD as Consulting Physician (General Surgery) Nicholas Lose, MD as Consulting Physician (Hematology and Oncology) Gatha Mayer, MD as Consulting Physician (Gastroenterology) Frann Rider, NP as Nurse Practitioner (Neurology) Tamela Gammon, NP as Nurse Practitioner (Gynecology)  HISTORY OF PRESENT ILLNESS: 71 y.o. female with a past medical history of right breast cancer 2005 s/p radiation and lumpectomy, 2022 recurrence status post bilateral mastectomy, genetic testing negative,, CKD, depression, CVA 2021, echo PFO present, EF 55 to 60% no AAS, thyroid disease, diverticulosis, GERD  and others listed below presents for evaluation of IBS and GERD.   05/29/19 colonoscopy for diarrhea negative random biopsies for microscopic colitis, 4 subcentimeter adenomatous polyps, normal TI, diverticulosis  recall 05/2024 05/29/19 EGD 5 cm hiatal hernia Did trial of Creon for diarrhea but states never tried.  History of alkaline phosphatase, positive GGT, ultrasound unremarkable, repeat labs 03/2020 showed ANA positive, ASMA and AMA negative.  IgG negative. 02/01/2020 abdominal ultrasound cholelithiasis without evidence of cholecystitis.   No ductal dilatation. Negative hepatitis panels. 03/28/2020 status post liver biopsy for elevated alk phos and ALT.   No fibrosis, minimal changes but overall healthy.  She is on omeprazole 40 mg daily and states this is helping.  Interested in going down to 20 mg, she does well with diet.  She rarely takes advil. Socially drinks ETOH.  Has gained weight this past year s/p double mastectomy. She has been doing intermittent fasting, has been doing NUME.  She denies leg swelling, she does have bloating and feels her AB is larger.  She denies nausea, vomiting, dysphagia.  She states her bowel have improved with benefiber daily.  No melena, no hematochezia.   Wt Readings from Last 3 Encounters:  04/21/22 150 lb (68 kg)  12/21/21 148 lb 4.8 oz (67.3 kg)  12/15/21 148 lb (67.1 kg)   CBC  02/11/2021  HGB 13.5 MCV 87.0 without evidence of anemia WBC 7.8 Platelets 206 Kidney function 02/11/2021  BUN 19 Cr 1.14  GFR 52  Potassium 4.5   LFTs 02/11/2021  AST 22 ALT 25 Alkphos 116 TBili 0.7  She  reports that she quit smoking about 25 years ago. Her smoking use included cigarettes. She has never used smokeless tobacco. She reports that she does not currently use alcohol. She reports that she does not use drugs.  Current Medications:   Current Outpatient Medications (Endocrine & Metabolic):    levothyroxine (SYNTHROID, LEVOTHROID) 50 MCG tablet, Take 50 mcg by mouth daily.  Current Outpatient Medications (Cardiovascular):    atorvastatin (LIPITOR) 40 MG  tablet, 1 tablet   Current Outpatient Medications (Analgesics):    aspirin EC 81 MG EC tablet, Take 1  tablet (81 mg total) by mouth daily. Swallow whole.   ibuprofen (ADVIL) 200 MG tablet, Take 200 mg by mouth every 6 (six) hours as needed for headache or moderate pain.   Current Outpatient Medications (Other):    Biotin 5 MG CAPS, Take 5 mg by mouth daily.   buPROPion (WELLBUTRIN XL) 150 MG 24 hr tablet, Take 150 mg by mouth every morning.   calcium carbonate (TUMS - DOSED IN MG ELEMENTAL CALCIUM) 500 MG chewable tablet, Chew 1 tablet by mouth daily as needed for indigestion or heartburn.   cholecalciferol (VITAMIN D3) 25 MCG (1000 UNIT) tablet, Take 1,000 Units by mouth daily.   DENTA 5000 PLUS 1.1 % CREA dental cream, Take by mouth.   FLUoxetine (PROZAC) 20 MG capsule, Take 20 mg by mouth daily.   letrozole (FEMARA) 2.5 MG tablet, TAKE 1 TABLET BY MOUTH EVERY DAY   Methen-Hyosc-Meth Blue-Na Phos (ME/NAPHOS/MB/HYO1) 81.6 MG TABS, Take 1 tablet by mouth daily as needed (urinary pain).   mirtazapine (REMERON) 15 MG tablet, Take 3.75 mg by mouth at bedtime.    zaleplon (SONATA) 10 MG capsule, Take 10 mg by mouth at bedtime as needed for sleep.   omeprazole (PRILOSEC) 20 MG capsule, Take 1 capsule (20 mg total) by mouth daily.  Medical History:  Past Medical History:  Diagnosis Date   Atypical glandular cells on Pap smear 2003   Cancer [facility]) 2005   Breast-Ductal CIS Right breast-Radiation - no lymph nodes removed per pt   CKD (chronic kidney disease)    CVA (cerebral vascular accident) ([place])    Depression    Family history of breast cancer 02/12/2021   Family history of prostate cancer 02/12/2021   GERD (gastroesophageal reflux disease)    Herpes progenitalis    History of COVID-19 10/15/2020   HPV in female 2014/2015/2016   Normal cytology but positive HPV x3 normal colposcopy/negative the ECC   Hyperlipidemia    Hypothyroidism    IBS (irritable bowel syndrome)    IC (interstitial cystitis)    Insomnia    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 Subsequent LEEP showed CIN-2 with clear margins and negative ECC   Osteopenia 11/2018   T score -1.5 FRAX 9% / 1% improved at both hips stable at spine   Personal history of breast cancer 02/12/2021   PFO (patent foramen ovale)    PONV (postoperative nausea and vomiting)    Stroke ([place]) 11/2019   Thyroid disease    Hyperthyroid   Allergies:  Allergies  Allergen Reactions   Symmetrel [Amantadine Hcl] Hives   Tamiflu [Oseltamivir Phosphate] Hives     Surgical History:  She  has a past surgical history that includes Cesarean section (1983); Tubal ligation (1984); Bunionectomy (2007, 2011); Endometrial ablation; Dilation and curettage of uterus (2009); Hysteroscopy (2009); Ganglion cyst excision (Left, 1985); Cervical cone biopsy (2003); Bladder stretch and Bx (1990); Mouth surgery (2012); Breast lumpectomy (2005); Breast lumpectomy (2006); Breast surgery; LEEP (05/2015); Mastectomy, partial; Cataract extraction (06/17/2020); Mastectomy w/ sentinel node biopsy (Right, 02/26/2021); and Total mastectomy (Left, 02/26/2021). Family History:  Her family history includes Bipolar disorder in her mother; Breast cancer (age of onset: 17) in her maternal grandmother; COPD in her mother; Dementia in her mother; Lung cancer (age of onset: 69) in her father; Prostate cancer in her maternal uncle.  REVIEW  OF SYSTEMS  : All other systems reviewed and negative except where noted in the History of Present Illness.  PHYSICAL EXAM: BP 110/70   Pulse 70   Ht 5' 5.5" (1.664 m)   Wt 150 lb (68 kg)   BMI 24.58 kg/m  General:   Pleasant, well developed female in no acute distress Head:   Normocephalic and atraumatic. Eyes:  sclerae anicteric,conjunctive pink  Heart:   regular rate and rhythm Pulm:  Clear anteriorly; no wheezing Abdomen:   Soft, Obese AB, Active bowel sounds. No tenderness. Without guarding and Without rebound, No organomegaly appreciated. Rectal: Not evaluated Extremities:   Without edema. Msk: Symmetrical without gross deformities. Peripheral pulses intact.  Neurologic:  Alert and  oriented x4;  No focal deficits.  Skin:   Dry and intact without significant lesions or rashes. Psychiatric:  Cooperative. Normal mood and affect.  RELEVANT LABS AND IMAGING: CBC    Component Value Date/Time   WBC 7.8 02/11/2021 1225   WBC 7.3 03/28/2020 1121   RBC 4.61 02/11/2021 1225   HGB 13.5 02/11/2021 1225   HCT 40.1 02/11/2021 1225   PLT 206 02/11/2021 1225   MCV 87.0 02/11/2021 1225   MCH 29.3 02/11/2021 1225   MCHC 33.7 02/11/2021 1225   RDW 13.4 02/11/2021 1225   LYMPHSABS 1.8 02/11/2021 1225   MONOABS 0.7 02/11/2021 1225   EOSABS 0.1 02/11/2021 1225   BASOSABS 0.1 02/11/2021 1225    CMP     Component Value Date/Time   NA 138 02/11/2021 1225   K 4.5 02/11/2021 1225   CL 104 02/11/2021 1225   CO2 24 02/11/2021 1225   GLUCOSE 103 (H) 02/11/2021 1225   BUN 19 02/11/2021 1225   CREATININE 1.14 (H) 02/11/2021 1225   CALCIUM 9.5 02/11/2021 1225   PROT 7.0 02/11/2021 1225   ALBUMIN 4.1 02/11/2021 1225   AST 22 02/11/2021 1225   ALT 25 02/11/2021 1225   ALKPHOS 116 02/11/2021 1225   BILITOT 0.7 02/11/2021 1225   GFRNONAA 52 (L) 02/11/2021 1225   GFRAA 53 (L) 11/30/2019 [place] Katlynn Naser, PA-C 9:02 AM

## 2022-04-21 ENCOUNTER — Ambulatory Visit: Payer: Medicare PPO | Admitting: Physician Assistant

## 2022-04-21 ENCOUNTER — Encounter: Payer: Self-pay | Admitting: Physician Assistant

## 2022-04-21 VITALS — BP 110/70 | HR 70 | Ht 65.5 in | Wt 150.0 lb

## 2022-04-21 DIAGNOSIS — R195 Other fecal abnormalities: Secondary | ICD-10-CM | POA: Diagnosis not present

## 2022-04-21 DIAGNOSIS — Z86010 Personal history of colonic polyps: Secondary | ICD-10-CM

## 2022-04-21 DIAGNOSIS — R748 Abnormal levels of other serum enzymes: Secondary | ICD-10-CM

## 2022-04-21 DIAGNOSIS — K219 Gastro-esophageal reflux disease without esophagitis: Secondary | ICD-10-CM

## 2022-04-21 DIAGNOSIS — R140 Abdominal distension (gaseous): Secondary | ICD-10-CM

## 2022-04-21 DIAGNOSIS — K449 Diaphragmatic hernia without obstruction or gangrene: Secondary | ICD-10-CM

## 2022-04-21 MED ORDER — OMEPRAZOLE 20 MG PO CPDR: 20.0000 mg | DELAYED_RELEASE_CAPSULE | Freq: Every day | ORAL | 3 refills | Status: DC

## 2022-04-21 NOTE — Patient Instructions (Signed)
Silent reflux: Not all heartburn burns...Marland KitchenMarland KitchenMarland Kitchen  Monitor for this Can do the omeprazole 20 mg daily  What is LPR? Laryngopharyngeal reflux (LPR) or silent reflux is a condition in which acid that is made in the stomach travels up the esophagus (swallowing tube) and gets to the throat. Not everyone with reflux has a lot of heartburn or indigestion. In fact, many people with LPR never have heartburn. This is why LPR is called SILENT REFLUX, and the terms "Silent reflux" and "LPR" are often used interchangeably. Because LPR is silent, it is sometimes difficult to diagnose.  How can you tell if you have LPR?  Chronic hoarseness- Some people have hoarseness that comes and goes throat clearing  Cough It can cause shortness of breath and cause asthma like symptoms. a feeling of a lump in the throat  difficulty swallowing a problem with too much nose and throat drainage.  Some people will feel their esophagus spasm which feels like their heart beating hard and fast, this will usually be after a meal, at rest, or lying down at night.    How do I treat this? Treatment for LPR should be individualized, and your doctor will suggest the best treatment for you. Generally there are several treatments for LPR: changing habits and diet to reduce reflux,  medications to reduce stomach acid, and  surgery to prevent reflux. Most people with LPR need to modify how and when they eat, as well as take some medication, to get well. Sometimes, nonprescription liquid antacids, such as Maalox, Gelucil and Mylanta are recommended. When used, these antacids should be taken four times each day - one tablespoon one hour after each meal and before bedtime. Dietary and lifestyle changes alone are not often enough to control LPR - medications that reduce stomach acid are also usually needed. These must be prescribed by our doctor.   TIPS FOR REDUCING REFLUX AND LPR Control your LIFE-STYLE and your DIET! If you use  tobacco, QUIT.  Smoking makes you reflux. After every cigarette you have some LPR.  Don't wear clothing that is too tight, especially around the waist (trousers, corsets, belts).  Do not lie down just after eating...in fact, do not eat within three hours of bedtime.  You should be on a low-fat diet.  Limit your intake of red meat.  Limit your intake of butter.  Avoid fried foods.  Avoid chocolate  Avoid cheese.  Avoid eggs. Specifically avoid caffeine (especially coffee and tea), soda pop (especially cola) and mints.  Avoid alcoholic beverages, particularly in the evening.  You have been scheduled for an abdominal ultrasound at [facility] (1st floor of hospital) on 04-28-2022 at 8am. Please arrive 30 minutes prior to your appointment for registration. Make certain not to have anything to eat or drink 6 hours prior to your appointment. Should you need to reschedule your appointment, please contact radiology at [phone]. This test typically takes about 30 minutes to perform.   It was a pleasure to see you today!  Thank you for trusting me with your gastrointestinal care!

## 2022-04-26 ENCOUNTER — Ambulatory Visit: Payer: Medicare PPO | Attending: General Surgery

## 2022-04-26 VITALS — Wt 149.0 lb

## 2022-04-26 DIAGNOSIS — Z483 Aftercare following surgery for neoplasm: Secondary | ICD-10-CM | POA: Insufficient documentation

## 2022-04-26 NOTE — Therapy (Signed)
OUTPATIENT PHYSICAL THERAPY SOZO SCREENING NOTE   Patient Name: Jaclyn Lopez MRN: [id number] DOB:18-Jun-1950, 71 y.o., female Today's Date: 04/26/2022  PCP: Deland Pretty, MD REFERRING PROVIDER: Rolm Bookbinder, MD   PT End of Session - 04/26/22 1045     Visit Number 6   # unchanged due to screen only   PT Start Time 1044    PT Stop Time 1049    PT Time Calculation (min) 5 min    Activity Tolerance Patient tolerated treatment well    Behavior During Therapy [place] for tasks assessed/performed             Past Medical History:  Diagnosis Date   Atypical glandular cells on Pap smear 2003   Cancer [facility]) 2005   Breast-Ductal CIS Right breast-Radiation - no lymph nodes removed per pt   CKD (chronic kidney disease)    CVA (cerebral vascular accident) ([place])    Depression    Family history of breast cancer 02/12/2021   Family history of prostate cancer 02/12/2021   GERD (gastroesophageal reflux disease)    Herpes progenitalis    History of COVID-19 10/15/2020   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 Subsequent LEEP showed CIN-2 with clear margins and negative ECC   Osteopenia 11/2018   T score -1.5 FRAX 9% / 1% improved at both hips stable at spine   Personal history of breast cancer 02/12/2021   PFO (patent foramen ovale)    PONV (postoperative nausea and vomiting)    Stroke ([place]) 11/2019   Thyroid disease    Hyperthyroid   Past Surgical History:  Procedure Laterality Date   Bladder stretch and Bx  1990   BREAST LUMPECTOMY  2005   right; cancer   BREAST LUMPECTOMY  2006   left; pre cancer   BREAST SURGERY     Reduction   BUNIONECTOMY  2007, 2011   CATARACT EXTRACTION  06/17/2020   CERVICAL CONE  BIOPSY  2003   CESAREAN SECTION  1983   DILATION AND CURETTAGE OF UTERUS  2009   ENDOMETRIAL ABLATION     Novasure   GANGLION CYST EXCISION Left 1985   HYSTEROSCOPY  2009   LEEP  05/2015   CIN-2 with clear margins   MASTECTOMY W/ SENTINEL NODE BIOPSY Right 02/26/2021   Procedure: RIGHT MASTECTOMY WITH AXILLARY SENTINEL LYMPH NODE BIOPSY;  Surgeon: Rolm Bookbinder, MD;  Location: [facility];  Service: General;  Laterality: Right;   MASTECTOMY, PARTIAL     MOUTH SURGERY  2012   implants   TOTAL MASTECTOMY Left 02/26/2021   Procedure: LEFT TOTAL MASTECTOMY;  Surgeon: Rolm Bookbinder, MD;  Location: [place];  Service: General;  Laterality: Left;   [place]   Patient Active Problem List   Diagnosis Date Noted   S/P bilateral mastectomy 02/26/2021   Genetic testing 02/18/2021   Family history of breast cancer 02/12/2021   Personal history of breast cancer 02/12/2021   Family history of prostate cancer 02/12/2021   Malignant neoplasm of upper-outer quadrant of right breast in female, estrogen receptor positive ([place]) 02/04/2021   CVA (cerebral vascular accident) ([place]) 11/29/2019   Depression 11/29/2019   CKD (chronic kidney disease)  stage 3, GFR 30-59 ml/min (HCC) 11/29/2019   Nausea and vomiting 03/15/2019   Gastroesophageal reflux disease 03/15/2019   Loose stools 03/15/2019   Elevated alkaline phosphatase level 03/15/2019   Herpes progenitalis    Osteopenia    Hypothyroidism    IC (interstitial cystitis)    DUCTAL CARCINOMA IN SITU, RIGHT BREAST 03/12/2008   DIVERTICULOSIS OF COLON 03/12/2008    REFERRING DIAG: right breast cancer at risk for lymphedema  THERAPY DIAG:  Aftercare following surgery for neoplasm  PERTINENT HISTORY: Patient was diagnosed on 01/07/2021 with right grade II invasive ductal carcinoma breast cancer. It measures 1 cm with calcifications in the upper outer quadrant. It is ER/PR positive and HER2 negative  with a Ki67 of 20%. She has a previous history of right breast DCIS with a lumpectomy and radiation in 2005. Bilateral mastectomies were performed on 02/26/2021 with 0/2 LN's on right and 0/1 LN on left.   PRECAUTIONS: right UE Lymphedema risk, None  SUBJECTIVE: Pt returns for her 3 month L-Dex screen.   PAIN:  Are you having pain? No  SOZO SCREENING: Patient was assessed today using the SOZO machine to determine the lymphedema index score. This was compared to her baseline score. It was determined that she is within the recommended range when compared to her baseline and no further action is needed at this time. She will continue SOZO screenings. These are done every 3 months for 2 years post operatively followed by every 6 months for 2 years, and then annually.   L-DEX FLOWSHEETS - 04/26/22 1000       L-DEX LYMPHEDEMA SCREENING   Measurement Type Unilateral    L-DEX MEASUREMENT EXTREMITY Upper Extremity    POSITION  Standing    DOMINANT SIDE Right    At Risk Side Right    BASELINE SCORE (UNILATERAL) 0.5    L-DEX SCORE (UNILATERAL) -2.5    VALUE CHANGE (UNILAT) -3               Otelia Limes, PTA 04/26/2022, 10:47 AM

## 2022-04-28 ENCOUNTER — Other Ambulatory Visit: Payer: Self-pay

## 2022-04-28 ENCOUNTER — Ambulatory Visit (HOSPITAL_COMMUNITY)
Admission: RE | Admit: 2022-04-28 | Discharge: 2022-04-28 | Disposition: A | Discharge status: Home / Self Care | Payer: Medicare PPO | Source: Ambulatory Visit | Attending: Physician Assistant | Admitting: Physician Assistant

## 2022-04-28 DIAGNOSIS — R748 Abnormal levels of other serum enzymes: Secondary | ICD-10-CM | POA: Diagnosis not present

## 2022-04-28 DIAGNOSIS — R140 Abdominal distension (gaseous): Secondary | ICD-10-CM | POA: Insufficient documentation

## 2022-04-28 DIAGNOSIS — K8020 Calculus of gallbladder without cholecystitis without obstruction: Secondary | ICD-10-CM | POA: Diagnosis not present

## 2022-04-28 DIAGNOSIS — R9389 Abnormal findings on diagnostic imaging of other specified body structures: Secondary | ICD-10-CM

## 2022-04-29 ENCOUNTER — Other Ambulatory Visit (INDEPENDENT_AMBULATORY_CARE_PROVIDER_SITE_OTHER): Payer: Medicare PPO

## 2022-04-29 DIAGNOSIS — C50911 Malignant neoplasm of unspecified site of right female breast: Secondary | ICD-10-CM | POA: Diagnosis not present

## 2022-04-29 DIAGNOSIS — R9389 Abnormal findings on diagnostic imaging of other specified body structures: Secondary | ICD-10-CM | POA: Diagnosis not present

## 2022-04-29 DIAGNOSIS — Z9012 Acquired absence of left breast and nipple: Secondary | ICD-10-CM | POA: Diagnosis not present

## 2022-04-29 LAB — COMPREHENSIVE METABOLIC PANEL
ALT: 20 U/L (ref 0–35)
AST: 20 U/L (ref 0–37)
Albumin: 4.4 g/dL (ref 3.5–5.2)
Alkaline Phosphatase: 129 U/L — ABNORMAL HIGH (ref 39–117)
BUN: 15 mg/dL (ref 6–23)
CO2: 30 mEq/L (ref 19–32)
Calcium: 9.5 mg/dL (ref 8.4–10.5)
Chloride: 105 mEq/L (ref 96–112)
Creatinine, Ser: 1.21 mg/dL — ABNORMAL HIGH (ref 0.40–1.20)
GFR: 45.55 mL/min — ABNORMAL LOW (ref >60.00)
Glucose, Bld: 87 mg/dL (ref 70–99)
Potassium: 4.7 mEq/L (ref 3.5–5.1)
Sodium: 141 mEq/L (ref 135–145)
Total Bilirubin: 0.7 mg/dL (ref 0.2–1.2)
Total Protein: 6.9 g/dL (ref 6.0–8.3)

## 2022-04-29 LAB — CBC WITH DIFFERENTIAL/PLATELET
Basophils Absolute: 0.1 10*3/uL (ref 0.0–0.1)
Basophils Relative: 1.2 % (ref 0.0–3.0)
Eosinophils Absolute: 0.2 10*3/uL (ref 0.0–0.7)
Eosinophils Relative: 2.2 % (ref 0.0–5.0)
HCT: 41.7 % (ref 36.0–46.0)
Hemoglobin: 13.8 g/dL (ref 12.0–15.0)
Lymphocytes Relative: 28.4 % (ref 12.0–46.0)
Lymphs Abs: 2.1 10*3/uL (ref 0.7–4.0)
MCHC: 33 g/dL (ref 30.0–36.0)
MCV: 85.4 fl (ref 78.0–100.0)
Monocytes Absolute: 0.6 10*3/uL (ref 0.1–1.0)
Monocytes Relative: 7.9 % (ref 3.0–12.0)
Neutro Abs: 4.6 10*3/uL (ref 1.4–7.7)
Neutrophils Relative %: 60.3 % (ref 43.0–77.0)
Platelets: 203 10*3/uL (ref 150.0–400.0)
RBC: 4.89 Mil/uL (ref 3.87–5.11)
RDW: 14.5 % (ref 11.5–15.5)
WBC: 7.6 10*3/uL (ref 4.0–10.5)

## 2022-05-04 ENCOUNTER — Other Ambulatory Visit: Payer: Self-pay | Admitting: General Surgery

## 2022-05-04 DIAGNOSIS — K8020 Calculus of gallbladder without cholecystitis without obstruction: Secondary | ICD-10-CM | POA: Diagnosis not present

## 2022-05-04 MED ORDER — SPY AGENT GREEN - (INDOCYANINE FOR INJECTION): 1.2500 mg | Freq: Once | INTRAMUSCULAR | Status: AC

## 2022-05-26 ENCOUNTER — Other Ambulatory Visit: Payer: Self-pay

## 2022-05-26 ENCOUNTER — Encounter (HOSPITAL_BASED_OUTPATIENT_CLINIC_OR_DEPARTMENT_OTHER): Payer: Self-pay | Admitting: General Surgery

## 2022-05-26 NOTE — Progress Notes (Signed)
   05/26/22 1226  PAT Phone Screen  Is the patient taking a GLP-1 receptor agonist? No  Do You Have Diabetes? No  Do You Have Hypertension? No  Have You Ever Been to the ER for Asthma? No  Have You Taken Oral Steroids in the Past 3 Months? No  Do you Take Phenteramine or any Other Diet Drugs? No  Recent  Lab Work, EKG, CXR? Yes  Do you have a history of heart problems? (S)  Yes (small PFO- no treatment needed)  Have you ever had tests on your heart? Yes  What cardiac tests were performed? Echo  What date/year were cardiac tests completed? 2022 EF 55-60%  Results viewable: [facility] Tab  Any Recent Hospitalizations? No  Height 5' 5.5" (1.664 m)  Weight 67.6 kg  Pat Appointment Scheduled Yes

## 2022-05-31 MED ORDER — ENSURE PRE-SURGERY PO LIQD: 296.0000 mL | Freq: Once | ORAL | Status: DC

## 2022-05-31 NOTE — Progress Notes (Signed)

## 2022-06-02 NOTE — Anesthesia Preprocedure Evaluation (Addendum)
Anesthesia Evaluation  Patient identified by MRN, date of birth, ID band Patient awake    Reviewed: Allergy & Precautions, NPO status , Patient's Chart, lab work & pertinent test results  History of Anesthesia Complications (+) PONV, AWARENESS UNDER ANESTHESIA and history of anesthetic complications (hx of awareness during tubal ligation ~40 yr ago)  Airway Mallampati: II  TM Distance: >3 FB Neck ROM: Full    Dental  (+) Dental Advisory Given, Teeth Intact   Pulmonary former smoker   Pulmonary exam normal        Cardiovascular Normal cardiovascular exam   PFO    Neuro/Psych  PSYCHIATRIC DISORDERS  Depression    CVA, No Residual Symptoms    GI/Hepatic Neg liver ROS,GERD  Medicated and Controlled,, IBS    Endo/Other  Hypothyroidism    Renal/GU CRFRenal disease    IC     Musculoskeletal negative musculoskeletal ROS (+)    Abdominal   Peds  Hematology negative hematology ROS (+)   Anesthesia Other Findings HSV  Reproductive/Obstetrics                             Anesthesia Physical Anesthesia Plan  ASA: 3  Anesthesia Plan: General   Post-op Pain Management: Regional block* and Tylenol PO (pre-op)*   Induction: Intravenous  PONV Risk Score and Plan: 4 or greater and Treatment may vary due to age or medical condition, Ondansetron, Dexamethasone and Propofol infusion  Airway Management Planned: Oral ETT  Additional Equipment: None  Intra-op Plan:   Post-operative Plan: Extubation in OR  Informed Consent: I have reviewed the patients History and Physical, chart, labs and discussed the procedure including the risks, benefits and alternatives for the proposed anesthesia with the patient or authorized representative who has indicated his/her understanding and acceptance.     Dental advisory given  Plan Discussed with: CRNA, Anesthesiologist and Surgeon  Anesthesia Plan  Comments:        Anesthesia Quick Evaluation

## 2022-06-03 ENCOUNTER — Ambulatory Visit (HOSPITAL_BASED_OUTPATIENT_CLINIC_OR_DEPARTMENT_OTHER)
Admission: RE | Admit: 2022-06-03 | Discharge: 2022-06-03 | Disposition: A | Discharge status: Home / Self Care | Payer: Medicare PPO | Source: Ambulatory Visit | Attending: General Surgery | Admitting: General Surgery

## 2022-06-03 ENCOUNTER — Ambulatory Visit (HOSPITAL_BASED_OUTPATIENT_CLINIC_OR_DEPARTMENT_OTHER): Payer: Medicare PPO | Admitting: Anesthesiology

## 2022-06-03 ENCOUNTER — Other Ambulatory Visit: Payer: Self-pay

## 2022-06-03 ENCOUNTER — Encounter (HOSPITAL_BASED_OUTPATIENT_CLINIC_OR_DEPARTMENT_OTHER): Payer: Self-pay | Admitting: General Surgery

## 2022-06-03 ENCOUNTER — Encounter (HOSPITAL_BASED_OUTPATIENT_CLINIC_OR_DEPARTMENT_OTHER)
Admission: RE | Disposition: A | Discharge status: Home / Self Care | Payer: Self-pay | Source: Ambulatory Visit | Attending: General Surgery

## 2022-06-03 DIAGNOSIS — E039 Hypothyroidism, unspecified: Secondary | ICD-10-CM

## 2022-06-03 DIAGNOSIS — K811 Chronic cholecystitis: Secondary | ICD-10-CM | POA: Diagnosis not present

## 2022-06-03 DIAGNOSIS — C50411 Malignant neoplasm of upper-outer quadrant of right female breast: Secondary | ICD-10-CM | POA: Diagnosis not present

## 2022-06-03 DIAGNOSIS — G8918 Other acute postprocedural pain: Secondary | ICD-10-CM | POA: Diagnosis not present

## 2022-06-03 DIAGNOSIS — Z87891 Personal history of nicotine dependence: Secondary | ICD-10-CM

## 2022-06-03 DIAGNOSIS — K219 Gastro-esophageal reflux disease without esophagitis: Secondary | ICD-10-CM | POA: Diagnosis not present

## 2022-06-03 DIAGNOSIS — Z01818 Encounter for other preprocedural examination: Secondary | ICD-10-CM

## 2022-06-03 DIAGNOSIS — N189 Chronic kidney disease, unspecified: Secondary | ICD-10-CM

## 2022-06-03 DIAGNOSIS — K8010 Calculus of gallbladder with chronic cholecystitis without obstruction: Secondary | ICD-10-CM | POA: Insufficient documentation

## 2022-06-03 HISTORY — PX: CHOLECYSTECTOMY: SHX55

## 2022-06-03 SURGERY — LAPAROSCOPIC CHOLECYSTECTOMY
Anesthesia: General | Site: Abdomen

## 2022-06-03 MED ORDER — FENTANYL CITRATE (PF) 100 MCG/2ML IJ SOLN
INTRAMUSCULAR | Status: AC
  Filled 2022-06-03: qty 2

## 2022-06-03 MED ORDER — ONDANSETRON HCL 4 MG/2ML IJ SOLN
INTRAMUSCULAR | Status: AC
  Filled 2022-06-03: qty 2

## 2022-06-03 MED ORDER — CEFAZOLIN SODIUM-DEXTROSE 2-4 GM/100ML-% IV SOLN: 2.0000 g | INTRAVENOUS | Status: AC

## 2022-06-03 MED ORDER — ACETAMINOPHEN 500 MG PO TABS
1000.0000 mg | ORAL_TABLET | Freq: Once | ORAL | Status: AC
  Administered 2022-06-03: 1000 mg via ORAL

## 2022-06-03 MED ORDER — CHLORHEXIDINE GLUCONATE CLOTH 2 % EX PADS: 6.0000 | MEDICATED_PAD | Freq: Once | CUTANEOUS | Status: DC

## 2022-06-03 MED ORDER — OXYCODONE HCL 5 MG PO TABS: 5.0000 mg | ORAL_TABLET | Freq: Once | ORAL | Status: DC | PRN

## 2022-06-03 MED ORDER — PROPOFOL 10 MG/ML IV BOLUS
INTRAVENOUS | Status: AC
  Filled 2022-06-03: qty 20

## 2022-06-03 MED ORDER — TRAMADOL HCL 50 MG PO TABS: 50.0000 mg | ORAL_TABLET | Freq: Four times a day (QID) | ORAL | 0 refills | Status: DC | PRN

## 2022-06-03 MED ORDER — ACETAMINOPHEN 500 MG PO TABS
ORAL_TABLET | ORAL | Status: AC
  Filled 2022-06-03: qty 2

## 2022-06-03 MED ORDER — ONDANSETRON HCL 4 MG/2ML IJ SOLN
4.0000 mg | Freq: Once | INTRAMUSCULAR | Status: AC | PRN
  Administered 2022-06-03: 4 mg via INTRAVENOUS

## 2022-06-03 MED ORDER — MIDAZOLAM HCL 2 MG/2ML IJ SOLN
INTRAMUSCULAR | Status: AC
  Filled 2022-06-03: qty 2

## 2022-06-03 MED ORDER — DEXAMETHASONE SODIUM PHOSPHATE 4 MG/ML IJ SOLN
INTRAMUSCULAR | Status: DC | PRN
  Administered 2022-06-03: 5 mg via INTRAVENOUS

## 2022-06-03 MED ORDER — FENTANYL CITRATE (PF) 100 MCG/2ML IJ SOLN
100.0000 ug | Freq: Once | INTRAMUSCULAR | Status: AC
  Administered 2022-06-03: 100 ug via INTRAVENOUS

## 2022-06-03 MED ORDER — MIDAZOLAM HCL 2 MG/2ML IJ SOLN
2.0000 mg | Freq: Once | INTRAMUSCULAR | Status: AC
  Administered 2022-06-03: 2 mg via INTRAVENOUS

## 2022-06-03 MED ORDER — SUGAMMADEX SODIUM 200 MG/2ML IV SOLN
INTRAVENOUS | Status: DC | PRN
  Administered 2022-06-03: 150 mg via INTRAVENOUS

## 2022-06-03 MED ORDER — LIDOCAINE HCL (CARDIAC) PF 100 MG/5ML IV SOSY
PREFILLED_SYRINGE | INTRAVENOUS | Status: DC | PRN
  Administered 2022-06-03: 60 mg via INTRAVENOUS

## 2022-06-03 MED ORDER — ROCURONIUM BROMIDE 100 MG/10ML IV SOLN
INTRAVENOUS | Status: DC | PRN
  Administered 2022-06-03: 50 mg via INTRAVENOUS

## 2022-06-03 MED ORDER — CEFAZOLIN SODIUM-DEXTROSE 2-4 GM/100ML-% IV SOLN
2.0000 g | INTRAVENOUS | Status: AC
  Administered 2022-06-03: 2 g via INTRAVENOUS

## 2022-06-03 MED ORDER — INDOCYANINE GREEN 25 MG IV SOLR
INTRAVENOUS | Status: DC | PRN
  Administered 2022-06-03: 3.125 mg via INTRAVENOUS

## 2022-06-03 MED ORDER — BUPIVACAINE HCL (PF) 0.25 % IJ SOLN
INTRAMUSCULAR | Status: DC | PRN
  Administered 2022-06-03: 8 mL

## 2022-06-03 MED ORDER — ACETAMINOPHEN 500 MG PO TABS: 1000.0000 mg | ORAL_TABLET | ORAL | Status: AC

## 2022-06-03 MED ORDER — ONDANSETRON HCL 4 MG/2ML IJ SOLN
INTRAMUSCULAR | Status: DC | PRN
  Administered 2022-06-03: 4 mg via INTRAVENOUS

## 2022-06-03 MED ORDER — PROPOFOL 10 MG/ML IV BOLUS
INTRAVENOUS | Status: DC | PRN
  Administered 2022-06-03: 150 mg via INTRAVENOUS

## 2022-06-03 MED ORDER — OXYCODONE HCL 5 MG/5ML PO SOLN: 5.0000 mg | Freq: Once | ORAL | Status: DC | PRN

## 2022-06-03 MED ORDER — BUPIVACAINE HCL (PF) 0.25 % IJ SOLN
INTRAMUSCULAR | Status: DC | PRN
  Administered 2022-06-03 (×2): 15 mL

## 2022-06-03 MED ORDER — BUPIVACAINE LIPOSOME 1.3 % IJ SUSP
INTRAMUSCULAR | Status: DC | PRN
  Administered 2022-06-03 (×2): 10 mL

## 2022-06-03 MED ORDER — FENTANYL CITRATE (PF) 100 MCG/2ML IJ SOLN
INTRAMUSCULAR | Status: DC | PRN
  Administered 2022-06-03 (×2): 50 ug via INTRAVENOUS

## 2022-06-03 MED ORDER — LACTATED RINGERS IV SOLN: INTRAVENOUS | Status: DC

## 2022-06-03 MED ORDER — CEFAZOLIN SODIUM-DEXTROSE 2-4 GM/100ML-% IV SOLN
INTRAVENOUS | Status: AC
  Filled 2022-06-03: qty 100

## 2022-06-03 MED ORDER — FENTANYL CITRATE (PF) 100 MCG/2ML IJ SOLN: 25.0000 ug | INTRAMUSCULAR | Status: DC | PRN

## 2022-06-03 SURGICAL SUPPLY — 39 items
ADH SKN CLS APL DERMABOND .7 (GAUZE/BANDAGES/DRESSINGS) ×1
APL PRP STRL LF DISP 70% ISPRP (MISCELLANEOUS) ×1
APPLIER CLIP 5 13 M/L LIGAMAX5 (MISCELLANEOUS) ×1
APR CLP MED LRG 5 ANG JAW (MISCELLANEOUS) ×1
BAG SPEC RTRVL 10 TROC 200 (ENDOMECHANICALS) ×1
CANISTER SUCT 1200ML W/VALVE (MISCELLANEOUS) ×2 IMPLANT
CHLORAPREP W/TINT 26 (MISCELLANEOUS) ×2 IMPLANT
CLIP APPLIE 5 13 M/L LIGAMAX5 (MISCELLANEOUS) ×2 IMPLANT
DERMABOND ADVANCED .7 DNX12 (GAUZE/BANDAGES/DRESSINGS) ×2 IMPLANT
DRAPE LAPAROSCOPIC ABDOMINAL (DRAPES) ×2 IMPLANT
ELECT REM PT RETURN 9FT ADLT (ELECTROSURGICAL) ×1
ELECTRODE REM PT RTRN 9FT ADLT (ELECTROSURGICAL) ×2 IMPLANT
GAUZE 4X4 16PLY ~~LOC~~+RFID DBL (SPONGE) ×2 IMPLANT
GLOVE BIO SURGEON STRL SZ7 (GLOVE) ×2 IMPLANT
GLOVE BIOGEL PI IND STRL 7.5 (GLOVE) ×2 IMPLANT
GOWN STRL REUS W/ TWL LRG LVL3 (GOWN DISPOSABLE) ×6 IMPLANT
GOWN STRL REUS W/TWL LRG LVL3 (GOWN DISPOSABLE) ×3
GRASPER SUT TROCAR 14GX15 (MISCELLANEOUS) IMPLANT
HEMOSTAT SNOW SURGICEL 2X4 (HEMOSTASIS) IMPLANT
IRRIG SUCT STRYKERFLOW 2 WTIP (MISCELLANEOUS) ×1
IRRIGATION SUCT STRKRFLW 2 WTP (MISCELLANEOUS) ×2 IMPLANT
KIT IMAGING PINPOINTPAQ (MISCELLANEOUS) ×2 IMPLANT
NS IRRIG 1000ML POUR BTL (IV SOLUTION) ×2 IMPLANT
PACK BASIN DAY SURGERY FS (CUSTOM PROCEDURE TRAY) ×2 IMPLANT
PACK SPY-PHI (KITS) ×2 IMPLANT
POUCH RETRIEVAL ECOSAC 10 (ENDOMECHANICALS) ×2 IMPLANT
POUCH RETRIEVAL ECOSAC 10MM (ENDOMECHANICALS) ×1
SCISSORS LAP 5X35 DISP (ENDOMECHANICALS) ×2 IMPLANT
SET TUBE SMOKE EVAC HIGH FLOW (TUBING) ×2 IMPLANT
SLEEVE ENDOPATH XCEL 5M (ENDOMECHANICALS) ×4 IMPLANT
SLEEVE SCD COMPRESS KNEE MED (STOCKING) ×2 IMPLANT
STRIP CLOSURE SKIN 1/2X4 (GAUZE/BANDAGES/DRESSINGS) ×2 IMPLANT
SUT MNCRL AB 4-0 PS2 18 (SUTURE) ×2 IMPLANT
SUT VICRYL 0 UR6 27IN ABS (SUTURE) IMPLANT
TOWEL GREEN STERILE FF (TOWEL DISPOSABLE) ×4 IMPLANT
TRAY LAPAROSCOPIC (CUSTOM PROCEDURE TRAY) ×2 IMPLANT
TROCAR XCEL BLUNT TIP 100MML (ENDOMECHANICALS) ×2 IMPLANT
TROCAR XCEL NON-BLD 5MMX100MML (ENDOMECHANICALS) ×2 IMPLANT
TUBE CONNECTING 20X1/4 (TUBING) ×2 IMPLANT

## 2022-06-03 NOTE — Anesthesia Postprocedure Evaluation (Signed)
Anesthesia Post Note  Patient: Jaclyn Lopez  Procedure(s) Performed: LAPAROSCOPIC CHOLECYSTECTOMY (Abdomen) INDOCYANINE GREEN FLUORESCENCE IMAGING (ICG) (Abdomen)     Patient location during evaluation: PACU Anesthesia Type: General Level of consciousness: awake and alert Pain management: pain level controlled Vital Signs Assessment: post-procedure vital signs reviewed and stable Respiratory status: spontaneous breathing, nonlabored ventilation and respiratory function stable Cardiovascular status: stable and blood pressure returned to baseline Anesthetic complications: no   No notable events documented.  Last Vitals:  Vitals:   06/03/22 1100 06/03/22 1115  BP: 126/67 131/69  Pulse: 69   Resp: 13 15  Temp:  36.6 C  SpO2: 95%     Last Pain:  Vitals:   06/03/22 1115  TempSrc:   PainSc: [place]

## 2022-06-03 NOTE — Op Note (Signed)
Preoperative diagnosis: Systematic cholelithiasis Postoperative diagnosis: Chronic cholecystitis Procedure: Laparoscopic cholecystectomy Surgeon: Dr. Serita Grammes Anesthesia: General with bilateral tap block Estimated blood loss: Minimal Complications: None Drains: None Sponge and needle count was correct completion Specimens: Gallbladder and contents to pathology Disposition to recovery stable condition  Indications:72 year old female I know from her breast cancer care. She for the last year and has been noted to have an elevated alkaline phosphatase as well as some abdominal bloating. She has heartburn as well as some loose stools. She has undergone an evaluation with GI. She had an EGD in 2021 that was fairly unremarkable. She had been noted previously to have cholelithiasis with a liver biopsy in November 21 that was negative. She was sent for a repeat ultrasound by GI. This shows her to have gallstones with some what is reported as mild gallbladder wall thickening as well as a positive sonographic Murphy sign. Her common bile duct is 3 mm and her liver is otherwise normal. She comes in today to discuss her options.   Procedure: After informed consent was obtained she first underwent a bilateral tap block.  She was given antibiotics.  SCDs were in place.  She was then taken to the operating room placed under general anesthesia without complication.  She was prepped and draped in the standard sterile surgical fashion.  Surgical timeout was then performed.  I infiltrated Marcaine below the umbilicus.  I made a vertical incision.  I grasped the fascia with Kocher clamps.  I incised this sharply and entered the peritoneum bluntly.  There was no evidence of an entry injury.  I then placed a 0 Vicryl pursestring suture through the fascia and inserted a Hassan trocar.  The abdomen was then insufflated to 15 mmHg pressure.  I then placed an additional three 5 mm ports in the epigastrium and right upper  quadrant under direct vision without complication.  I then retracted the gallbladder cephalad.  She had evidence of chronic cholecystitis.  Her duodenum and omentum were adherent and I took this down with a combination of blunt dissection and some cautery.  I then was able to dissect in the triangle.  She had some chronic inflammation present there.  Eventually I was able to obtain a critical view of safety.  I confirmed this with the fluorescence mode with the end assigning green dye that we had injected.  I then clipped the artery 3 times and divided it leaving 2 clips in place.  I then clipped the cystic duct 3 times and divided it leaving 2 clips in place.  The clips completely traverse the duct and the duct was viable.  I then took the gallbladder off the liver bed.  There was another small artery up I that I put a clip on.  I then remove the gallbladder from the liver bed without difficulty.  This was placed in a retrieval bag and removed.  I then obtained hemostasis.  There was some oozing just from the raw liver bed due to the inflammation so I did place a piece of Surgicel snow there.  I then removed the [facility] trocar and time I pursestring down.  I placed an additional 2 0 Vicryl sutures using the suture passer device to completely obliterate that defect.  I then remove the remaining trocars.  I then closed these with 4-0 Monocryl, glue, and Steri-Strips.  She tolerated this well was extubated transferred recovery stable.

## 2022-06-03 NOTE — Anesthesia Procedure Notes (Signed)
Anesthesia Regional Block: TAP block   Pre-Anesthetic Checklist: , timeout performed,  Correct Patient, Correct Site, Correct Laterality,  Correct Procedure, Correct Position, site marked,  Risks and benefits discussed,  Surgical consent,  Pre-op evaluation,  At surgeon's request and post-op pain management  Laterality: Left  Prep: chloraprep       Needles:  Injection technique: Single-shot  Needle Type: Echogenic Needle     Needle Length: 10cm  Needle Gauge: 21     Additional Needles:   Narrative:  Start time: 06/03/2022 8:04 AM End time: 06/03/2022 8:07 AM Injection made incrementally with aspirations every 5 mL.  Performed by: Personally  Anesthesiologist: Audry Pili, MD  Additional Notes: No pain on injection. No increased resistance to injection. Injection made in 5cc increments. Good needle visualization. Patient tolerated the procedure well.

## 2022-06-03 NOTE — Anesthesia Procedure Notes (Signed)
Procedure Name: Intubation Date/Time: 06/03/2022 8:51 AM  Performed by: Glory Buff, CRNAPre-anesthesia Checklist: Patient identified, Emergency Drugs available, Suction available and Patient being monitored Patient Re-evaluated:Patient Re-evaluated prior to induction Oxygen Delivery Method: Circle system utilized Preoxygenation: Pre-oxygenation with 100% oxygen Induction Type: IV induction Ventilation: Mask ventilation without difficulty Laryngoscope Size: Miller and 3 Grade View: Grade II Tube type: Oral Tube size: 7.0 mm Number of attempts: 1 Airway Equipment and Method: Stylet and Oral airway Placement Confirmation: ETT inserted through vocal cords under direct vision, positive ETCO2 and breath sounds checked- equal and bilateral Secured at: 21 cm Tube secured with: Tape Dental Injury: Teeth and Oropharynx as per pre-operative assessment

## 2022-06-03 NOTE — Discharge Instructions (Addendum)
[facility] LAPAROSCOPIC SURGERY: POST OP INSTRUCTIONS  Always review your discharge instruction sheet given to you by the facility where your surgery was performed. IF YOU HAVE DISABILITY OR FAMILY LEAVE FORMS, YOU MUST BRING THEM TO THE OFFICE FOR PROCESSING.   DO NOT GIVE THEM TO YOUR DOCTOR.  A prescription for pain medication may be given to you upon discharge.  Take your pain medication as prescribed, if needed.  If narcotic pain medicine is not needed, then you may take acetaminophen (Tylenol), naprosyn (Alleve), or ibuprofen (Advil) as needed. Take your usually prescribed medications unless otherwise directed. If you need a refill on your pain medication, please contact your pharmacy.  They will contact our office to request authorization. Prescriptions will not be filled after 5pm or on week-ends. You should follow a light diet the first few days after arrival home, such as soup and crackers, etc.  Be sure to include lots of fluids daily. Most patients will experience some swelling and bruising in the area of the incisions.  Ice packs will help.  Swelling and bruising can take several days to resolve.  It is common to experience some constipation if taking pain medication after surgery.  Increasing fluid intake and taking a stool softener (such as Colace) will usually help or prevent this problem from occurring.  A mild laxative (Milk of Magnesia or Miralax) should be taken according to package instructions if there are no bowel movements after 48 hours. Unless discharge instructions indicate otherwise, you may remove your bandages 48 hours after surgery, and you may shower at that time.  You may have steri-strips (small skin tapes) in place directly over the incision.  These strips should be left on the skin for 7-10 days.  If your surgeon used skin glue on the incision, you may shower in 24 hours.  The glue will flake off over the next  2-3 weeks.  Any sutures or staples will be removed at the office during your follow-up visit. ACTIVITIES:  You may resume regular (light) daily activities beginning the next day--such as daily self-care, walking, climbing stairs--gradually increasing activities as tolerated.  You may have sexual intercourse when it is comfortable.  Refrain from any heavy lifting or straining until approved by your doctor. You may drive when you are no longer taking prescription pain medication, you can comfortably wear a seatbelt, and you can safely maneuver your car and apply brakes. RETURN TO WORK:  __________________________________________________________ Dennis Bast should see your doctor in the office for a follow-up appointment approximately 2-3 weeks after your surgery.  Make sure that you call for this appointment within a day or two after you arrive home to insure a convenient appointment time. OTHER INSTRUCTIONS: __________________________________________________________________________________________________________________________ __________________________________________________________________________________________________________________________ WHEN TO CALL YOUR DOCTOR: Fever over 101.0 Inability to urinate Continued bleeding from incision. Increased pain, redness, or drainage from the incision. Increasing abdominal pain  The clinic staff is available to answer your questions during regular business hours.  Please don't hesitate to call and ask to speak to one of the nurses for clinical concerns.  If you have a medical emergency, go to the nearest emergency room or call 911.  A surgeon from [facility] is always on call at the hospital. [address], [place], [place], [place]  [address] ? [address], [place], [place]   [address] [phone] ? [phone] ? FAX [phone] Web site: [email or web address] Information for Discharge Teaching: EXPAREL (bupivacaine  liposome injectable suspension)   Your  surgeon or anesthesiologist gave you EXPAREL(bupivacaine) to help control your pain after surgery.  EXPAREL is a local anesthetic that provides pain relief by numbing the tissue around the surgical site. EXPAREL is designed to release pain medication over time and can control pain for up to 72 hours. Depending on how you respond to EXPAREL, you may require less pain medication during your recovery.  Possible side effects: Temporary loss of sensation or ability to move in the area where bupivacaine was injected. Nausea, vomiting, constipation Rarely, numbness and tingling in your mouth or lips, lightheadedness, or anxiety may occur. Call your doctor right away if you think you may be experiencing any of these sensations, or if you have other questions regarding possible side effects.  Follow all other discharge instructions given to you by your surgeon or nurse. Eat a healthy diet and drink plenty of water or other fluids.  If you return to the hospital for any reason within 96 hours following the administration of EXPAREL, it is important for health care providers to know that you have received this anesthetic. A teal colored band has been placed on your arm with the date, time and amount of EXPAREL you have received in order to alert and inform your health care providers. Please leave this armband in place for the full 96 hours following administration, and then you may remove the band.   Post Anesthesia Home Care Instructions  Activity: Get plenty of rest for the remainder of the day. A responsible individual must stay with you for 24 hours following the procedure.  For the next 24 hours, DO NOT: -Drive a car -Paediatric nurse -Drink alcoholic beverages -Take any medication unless instructed by your physician -Make any legal decisions or sign important papers.  Meals: Start with liquid foods such as gelatin or soup. Progress to regular foods as  tolerated. Avoid greasy, spicy, heavy foods. If nausea and/or vomiting occur, drink only clear liquids until the nausea and/or vomiting subsides. Call your physician if vomiting continues.  Special Instructions/Symptoms: Your throat may feel dry or sore from the anesthesia or the breathing tube placed in your throat during surgery. If this causes discomfort, gargle with warm salt water. The discomfort should disappear within 24 hours.  Tylenol can be taken after 1:10 pm

## 2022-06-03 NOTE — H&P (Signed)
72 year old female I know from her breast cancer care. She for the last year and has been noted to have an elevated alkaline phosphatase as well as some abdominal bloating. She has heartburn as well as some loose stools. She has undergone an evaluation with GI. She had an EGD in 2021 that was fairly unremarkable. She had been noted previously to have cholelithiasis with a liver biopsy in November 21 that was negative. She was sent for a repeat ultrasound by GI. This shows her to have gallstones with some what is reported as mild gallbladder wall thickening as well as a positive sonographic Murphy sign. Her common bile duct is 3 mm and her liver is otherwise normal. She comes in today to discuss her options.  Review of Systems: A complete review of systems was obtained from the patient. I have reviewed this information and discussed as appropriate with the patient. See HPI as well for other ROS.  Review of Systems Gastrointestinal: Positive for diarrhea, heartburn and nausea. All other systems reviewed and are negative.   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, estrogen receptor positive Thyroid nodule Stage 3 chronic kidney disease (CMS-HCC)  Past Surgical History: Procedure Laterality Date CATARACT EXTRACTION N/A 06/17/2020 Breast Lumpectomy Right 2005 DEEP AXILLARY SENTINEL NODE BIOPSY / EXCISION Right Dilation and Curettage of uterus N/A 2009 MASTECTOMY BILATERAL SIMPLE Bilateral MASTECTOMY PARTIAL / LUMPECTOMY Left "high risk lesion" I dont have path yet Mouth Surgery N/A 2012   Allergies Allergen Reactions Amantadine Hcl Hives Oseltamivir Hives and Other (See Comments)  Current Outpatient Medications on File Prior to  Visit Medication Sig Dispense Refill aspirin 81 MG EC tablet Take by mouth atorvastatin (LIPITOR) 40 MG tablet 1 tablet buPROPion (WELLBUTRIN XL) 150 MG XL tablet Take 1 tablet by mouth every morning calcium carbonate (TUMS) 200 mg calcium (500 mg) chewable tablet Take by mouth cholecalciferol (VITAMIN D3) 1000 unit tablet Take by mouth dext 70/polycarbophil/peg/NaCl (ARTIFICIAL TEAR SOLUTION OPHTH) Apply to eye docosahexaenoic acid-epa 120-180 mg Cap Take by mouth FLUoxetine (PROZAC) 20 MG capsule Take 1 capsule by mouth once daily gabapentin (NEURONTIN) 300 MG capsule Take 1 capsule (300 mg total) by mouth at bedtime 30 capsule 2 levothyroxine (SYNTHROID) 50 MCG tablet TAKE 1 TABLET BY MOUTH ON AN EMPTY STOMACH IN THE MORNING mirtazapine (REMERON) 15 MG tablet TAKE 1/4 TABLET BY MOUTH BEFORE BEDTIME EVERY DAY 90 omeprazole (PRILOSEC) 40 MG DR capsule Take by mouth zaleplon (SONATA) 10 MG capsule Take by mouth  No current facility-administered medications on file prior to visit.  Family History Problem Relation Age of Onset Bipolar disorder Mother COPD Mother Dementia Mother Lung cancer Father Breast cancer Maternal Grandmother   Social History  Tobacco Use Smoking Status Former Types: Cigarettes Smokeless Tobacco Never Tobacco Comments Quit smoking 05/17/96 Marital status: Single Tobacco Use Smoking status: Former Types: Cigarettes Smokeless tobacco: Never Tobacco comments: Quit smoking 05/17/96 Vaping Use Vaping Use: Never used Substance and Sexual Activity Alcohol use: Yes Alcohol/week: 1.0 standard drink of alcohol Types: 1 Glasses of wine per week Comment: 1 glass of wine a week Drug use: Never  Objective:  Vitals: 05/04/22 1150 Pulse: 87 Temp: 36.8 C (98.3 F) SpO2: 98% Weight: 67.9 kg (149 lb 12.8 oz) Height: 167.6 cm ('5\' 6"'$ ) PainSc: 2  Body mass index is 24.18 kg/m.  Physical Exam Vitals reviewed. Constitutional: Appearance: Normal  appearance. Eyes: General: No scleral icterus. Abdominal: Comments: Soft nondistended ruq tender to palpation Neurological: Mental Status: She is alert.    Assessment and Plan:  Gallstones Lap chole   I am not sure that all of her symptoms are related to her gallbladder. However she is tender in her right upper quadrant with an abnormal ultrasound. She may have some component of gallbladder disease leading to the symptoms. I did discuss if she has symptoms it is reasonable to consider removing her gallbladder due to the other issues that can show up. I did recommend laparoscopic cholecystectomy.  I discussed the procedure in detail. We discussed the risks and benefits of a laparoscopic cholecystectomy and possible cholangiogram including, but not limited to bleeding, infection, injury to surrounding structures such as the intestine or liver, bile leak, retained gallstones, need to convert to an open procedure, prolonged diarrhea, blood clots such as DVT, common bile duct injury, anesthesia risks, and possible need for additional procedures. The likelihood of improvement in symptoms and return to the patient's normal status is good. We discussed the typical post-operative recovery course.

## 2022-06-03 NOTE — Progress Notes (Signed)
Assisted Dr. Fransisco Beau with left, right, transabdominal plane, ultrasound guided block. Side rails up, monitors on throughout procedure. See vital signs in flow sheet. Tolerated Procedure well.

## 2022-06-03 NOTE — Transfer of Care (Signed)
Immediate Anesthesia Transfer of Care Note  Patient: Jaclyn Lopez  Procedure(s) Performed: LAPAROSCOPIC CHOLECYSTECTOMY (Abdomen) INDOCYANINE GREEN FLUORESCENCE IMAGING (ICG) (Abdomen)  Patient Location: PACU  Anesthesia Type:General  Level of Consciousness: drowsy and patient cooperative  Airway & Oxygen Therapy: Patient Spontanous Breathing and Patient connected to face mask oxygen  Post-op Assessment: Report given to RN and Post -op Vital signs reviewed and stable  Post vital signs: Reviewed and stable  Last Vitals:  Vitals Value Taken Time  BP    Temp    Pulse    Resp    SpO2      Last Pain:  Vitals:   06/03/22 0710  TempSrc: Oral  PainSc: 0-No pain      Patients Stated Pain Goal: 3 (35/82/51 8984)  Complications: No notable events documented.

## 2022-06-03 NOTE — Interval H&P Note (Signed)
History and Physical Interval Note:  06/03/2022 8:21 AM  Jaclyn Lopez  has presented today for surgery, with the diagnosis of GALLSTONES.  The various methods of treatment have been discussed with the patient and family. After consideration of risks, benefits and other options for treatment, the patient has consented to  Procedure(s): LAPAROSCOPIC CHOLECYSTECTOMY (N/A) INDOCYANINE GREEN FLUORESCENCE IMAGING (ICG) (N/A) as a surgical intervention.  The patient's history has been reviewed, patient examined, no change in status, stable for surgery.  I have reviewed the patient's chart and labs.  Questions were answered to the patient's satisfaction.     Rolm Bookbinder

## 2022-06-03 NOTE — Anesthesia Procedure Notes (Signed)
Anesthesia Regional Block: TAP block   Pre-Anesthetic Checklist: , timeout performed,  Correct Patient, Correct Site, Correct Laterality,  Correct Procedure, Correct Position, site marked,  Risks and benefits discussed,  Surgical consent,  Pre-op evaluation,  At surgeon's request and post-op pain management  Laterality: Right  Prep: chloraprep       Needles:  Injection technique: Single-shot  Needle Type: Echogenic Needle     Needle Length: 10cm  Needle Gauge: 21     Additional Needles:   Narrative:  Start time: 06/03/2022 8:00 AM End time: 06/03/2022 8:03 AM Injection made incrementally with aspirations every 5 mL.  Performed by: Personally  Anesthesiologist: Audry Pili, MD  Additional Notes: No pain on injection. No increased resistance to injection. Injection made in 5cc increments. Good needle visualization. Patient tolerated the procedure well.

## 2022-06-04 ENCOUNTER — Encounter (HOSPITAL_BASED_OUTPATIENT_CLINIC_OR_DEPARTMENT_OTHER): Payer: Self-pay | Admitting: General Surgery

## 2022-06-04 LAB — SURGICAL PATHOLOGY

## 2022-07-26 ENCOUNTER — Ambulatory Visit: Payer: Medicare PPO | Attending: General Surgery

## 2022-07-26 VITALS — Wt 150.4 lb

## 2022-07-26 DIAGNOSIS — Z483 Aftercare following surgery for neoplasm: Secondary | ICD-10-CM | POA: Insufficient documentation

## 2022-07-26 NOTE — Therapy (Signed)
OUTPATIENT PHYSICAL THERAPY SOZO SCREENING NOTE   Patient Name: Jaclyn Lopez MRN: [id number] DOB:1950-08-25, 72 y.o., female 26 Date: 07/26/2022  PCP: Deland Pretty, MD REFERRING PROVIDER: Rolm Bookbinder, MD   PT End of Session - 07/26/22 1040     Visit Number 6   # unchanged due to screen only   PT Start Time 11    PT Stop Time 1042    PT Time Calculation (min) 4 min    Activity Tolerance Patient tolerated treatment well    Behavior During Therapy [facility] for tasks assessed/performed             Past Medical History:  Diagnosis Date   Atypical glandular cells on Pap smear 2003   Cancer [facility]) 2005   Breast-Ductal CIS Right breast-Radiation - no lymph nodes removed per pt   CKD (chronic kidney disease)    CVA (cerebral vascular accident) ([place])    Depression    Family history of breast cancer 02/12/2021   Family history of prostate cancer 02/12/2021   GERD (gastroesophageal reflux disease)    Herpes progenitalis    History of COVID-19 10/15/2020   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 Subsequent LEEP showed CIN-2 with clear margins and negative ECC   Osteopenia 11/2018   T score -1.5 FRAX 9% / 1% improved at both hips stable at spine   Personal history of breast cancer 02/12/2021   PFO (patent foramen ovale)    PONV (postoperative nausea and vomiting)    Stroke ([place]) 11/2019   Thyroid disease    Hyperthyroid   Past Surgical History:  Procedure Laterality Date   Bladder stretch and Bx  1990   BREAST LUMPECTOMY  2005   right; cancer   BREAST LUMPECTOMY  2006   left; pre cancer   BREAST SURGERY     Reduction   BUNIONECTOMY  2007, 2011   CATARACT EXTRACTION  06/17/2020   CERVICAL CONE  BIOPSY  2003   CESAREAN SECTION  1983   CHOLECYSTECTOMY N/A 06/03/2022   Procedure: LAPAROSCOPIC CHOLECYSTECTOMY;  Surgeon: Rolm Bookbinder, MD;  Location: [place];  Service: General;  Laterality: N/A;   DILATION AND CURETTAGE OF UTERUS  2009   ENDOMETRIAL ABLATION     Novasure   GANGLION CYST EXCISION Left 1985   HYSTEROSCOPY  2009   LEEP  05/2015   CIN-2 with clear margins   MASTECTOMY W/ SENTINEL NODE BIOPSY Right 02/26/2021   Procedure: RIGHT MASTECTOMY WITH AXILLARY SENTINEL LYMPH NODE BIOPSY;  Surgeon: Rolm Bookbinder, MD;  Location: [place];  Service: General;  Laterality: Right;   MASTECTOMY, PARTIAL     MOUTH SURGERY  2012   implants   TOTAL MASTECTOMY Left 02/26/2021   Procedure: LEFT TOTAL MASTECTOMY;  Surgeon: Rolm Bookbinder, MD;  Location: [place];  Service: General;  Laterality: Left;   [place]   Patient Active Problem List   Diagnosis Date Noted   S/P bilateral mastectomy 02/26/2021   Genetic testing 02/18/2021   Family history of breast cancer 02/12/2021   Personal history of breast cancer 02/12/2021   Family history of prostate cancer 02/12/2021   Malignant neoplasm of upper-outer quadrant of  right breast in female, estrogen receptor positive ([place]) 02/04/2021   CVA (cerebral vascular accident) ([place]) 11/29/2019   Depression 11/29/2019   CKD (chronic kidney disease) stage 3, GFR 30-59 ml/min (HCC) 11/29/2019   Nausea and vomiting 03/15/2019   Gastroesophageal reflux disease 03/15/2019   Loose stools 03/15/2019   Elevated alkaline phosphatase level 03/15/2019   Herpes progenitalis    Osteopenia    Hypothyroidism    IC (interstitial cystitis)    DUCTAL CARCINOMA IN SITU, RIGHT BREAST 03/12/2008   DIVERTICULOSIS OF COLON 03/12/2008    REFERRING DIAG: right breast cancer at risk for lymphedema  THERAPY DIAG:  Aftercare following surgery for neoplasm  PERTINENT HISTORY: Patient was  diagnosed on 01/07/2021 with right grade II invasive ductal carcinoma breast cancer. It measures 1 cm with calcifications in the upper outer quadrant. It is ER/PR positive and HER2 negative with a Ki67 of 20%. She has a previous history of right breast DCIS with a lumpectomy and radiation in 2005. Bilateral mastectomies were performed on 02/26/2021 with 0/2 LN's on right and 0/1 LN on left.   PRECAUTIONS: right UE Lymphedema risk, None  SUBJECTIVE: Pt returns for her 3 month L-Dex screen.   PAIN:  Are you having pain? No  SOZO SCREENING: Patient was assessed today using the SOZO machine to determine the lymphedema index score. This was compared to her baseline score. It was determined that she is within the recommended range when compared to her baseline and no further action is needed at this time. She will continue SOZO screenings. These are done every 3 months for 2 years post operatively followed by every 6 months for 2 years, and then annually.   L-DEX FLOWSHEETS - 07/26/22 1000       L-DEX LYMPHEDEMA SCREENING   Measurement Type Unilateral    L-DEX MEASUREMENT EXTREMITY Upper Extremity    POSITION  Standing    DOMINANT SIDE Right    At Risk Side Right    BASELINE SCORE (UNILATERAL) 0.5    L-DEX SCORE (UNILATERAL) -3.7    VALUE CHANGE (UNILAT) -4.2               Otelia Limes, PTA 07/26/2022, 10:41 AM

## 2022-08-03 DIAGNOSIS — B360 Pityriasis versicolor: Secondary | ICD-10-CM | POA: Diagnosis not present

## 2022-08-03 DIAGNOSIS — D225 Melanocytic nevi of trunk: Secondary | ICD-10-CM | POA: Diagnosis not present

## 2022-08-03 DIAGNOSIS — Z1283 Encounter for screening for malignant neoplasm of skin: Secondary | ICD-10-CM | POA: Diagnosis not present

## 2022-08-04 DIAGNOSIS — H903 Sensorineural hearing loss, bilateral: Secondary | ICD-10-CM | POA: Diagnosis not present

## 2022-08-23 DIAGNOSIS — M79671 Pain in right foot: Secondary | ICD-10-CM | POA: Diagnosis not present

## 2022-08-26 DIAGNOSIS — E032 Hypothyroidism due to medicaments and other exogenous substances: Secondary | ICD-10-CM | POA: Diagnosis not present

## 2022-08-26 DIAGNOSIS — E039 Hypothyroidism, unspecified: Secondary | ICD-10-CM | POA: Diagnosis not present

## 2022-09-13 DIAGNOSIS — M79671 Pain in right foot: Secondary | ICD-10-CM | POA: Diagnosis not present

## 2022-10-01 DIAGNOSIS — H02051 Trichiasis without entropian right upper eyelid: Secondary | ICD-10-CM | POA: Diagnosis not present

## 2022-10-01 DIAGNOSIS — H52203 Unspecified astigmatism, bilateral: Secondary | ICD-10-CM | POA: Diagnosis not present

## 2022-10-01 DIAGNOSIS — H26491 Other secondary cataract, right eye: Secondary | ICD-10-CM | POA: Diagnosis not present

## 2022-10-01 DIAGNOSIS — H04123 Dry eye syndrome of bilateral lacrimal glands: Secondary | ICD-10-CM | POA: Diagnosis not present

## 2022-10-01 DIAGNOSIS — H43813 Vitreous degeneration, bilateral: Secondary | ICD-10-CM | POA: Diagnosis not present

## 2022-10-04 DIAGNOSIS — M79671 Pain in right foot: Secondary | ICD-10-CM | POA: Diagnosis not present

## 2022-10-05 DIAGNOSIS — G4700 Insomnia, unspecified: Secondary | ICD-10-CM | POA: Diagnosis not present

## 2022-10-05 DIAGNOSIS — F330 Major depressive disorder, recurrent, mild: Secondary | ICD-10-CM | POA: Diagnosis not present

## 2022-11-08 ENCOUNTER — Ambulatory Visit: Payer: Medicare PPO | Attending: General Surgery

## 2022-11-08 ENCOUNTER — Ambulatory Visit: Payer: Medicare PPO

## 2022-11-08 VITALS — Wt 145.4 lb

## 2022-11-08 DIAGNOSIS — Z483 Aftercare following surgery for neoplasm: Secondary | ICD-10-CM | POA: Insufficient documentation

## 2022-11-08 NOTE — Therapy (Signed)
OUTPATIENT PHYSICAL THERAPY SOZO SCREENING NOTE   Patient Name: Jaclyn Lopez MRN: [id number] DOB:07-Apr-1951, 72 y.o., female Today's Date: 11/08/2022  PCP: Merri Brunette, MD REFERRING PROVIDER: Emelia Loron, MD   PT End of Session - 11/08/22 1037     Visit Number 6   # unchanged due to screen only   PT Start Time 1035    PT Stop Time 1038    PT Time Calculation (min) 3 min    Activity Tolerance Patient tolerated treatment well    Behavior During Therapy [facility] for tasks assessed/performed             Past Medical History:  Diagnosis Date   Atypical glandular cells on Pap smear 2003   Cancer [facility]) 2005   Breast-Ductal CIS Right breast-Radiation - no lymph nodes removed per pt   CKD (chronic kidney disease)    CVA (cerebral vascular accident) (HCC)    Depression    Family history of breast cancer 02/12/2021   Family history of prostate cancer 02/12/2021   GERD (gastroesophageal reflux disease)    Herpes progenitalis    History of COVID-19 10/15/2020   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 Subsequent LEEP showed CIN-2 with clear margins and negative ECC   Osteopenia 11/2018   T score -1.5 FRAX 9% / 1% improved at both hips stable at spine   Personal history of breast cancer 02/12/2021   PFO (patent foramen ovale)    PONV (postoperative nausea and vomiting)    Stroke (HCC) 11/2019   Thyroid disease    Hyperthyroid   Past Surgical History:  Procedure Laterality Date   Bladder stretch and Bx  1990   BREAST LUMPECTOMY  2005   right; cancer   BREAST LUMPECTOMY  2006   left; pre cancer   BREAST SURGERY     Reduction   BUNIONECTOMY  2007, 2011   CATARACT EXTRACTION  06/17/2020   CERVICAL CONE  BIOPSY  2003   CESAREAN SECTION  1983   CHOLECYSTECTOMY N/A 06/03/2022   Procedure: LAPAROSCOPIC CHOLECYSTECTOMY;  Surgeon: Emelia Loron, MD;  Location: [facility];  Service: General;  Laterality: N/A;   DILATION AND CURETTAGE OF UTERUS  2009   ENDOMETRIAL ABLATION     Novasure   GANGLION CYST EXCISION Left 1985   HYSTEROSCOPY  2009   LEEP  05/2015   CIN-2 with clear margins   MASTECTOMY W/ SENTINEL NODE BIOPSY Right 02/26/2021   Procedure: RIGHT MASTECTOMY WITH AXILLARY SENTINEL LYMPH NODE BIOPSY;  Surgeon: Emelia Loron, MD;  Location: [facility];  Service: General;  Laterality: Right;   MASTECTOMY, PARTIAL     MOUTH SURGERY  2012   implants   TOTAL MASTECTOMY Left 02/26/2021   Procedure: LEFT TOTAL MASTECTOMY;  Surgeon: Emelia Loron, MD;  Location: [facility];  Service: General;  Laterality: Left;   TUBAL LIGATION  1984   Patient Active Problem List   Diagnosis Date Noted   S/P bilateral mastectomy 02/26/2021   Genetic testing 02/18/2021   Family history of breast cancer 02/12/2021   Personal history of breast cancer 02/12/2021   Family history of prostate cancer 02/12/2021   Malignant neoplasm of upper-outer quadrant of  right breast in female, estrogen receptor positive (HCC) 02/04/2021   CVA (cerebral vascular accident) (HCC) 11/29/2019   Depression 11/29/2019   CKD (chronic kidney disease) stage 3, GFR 30-59 ml/min (HCC) 11/29/2019   Nausea and vomiting 03/15/2019   Gastroesophageal reflux disease 03/15/2019   Loose stools 03/15/2019   Elevated alkaline phosphatase level 03/15/2019   Herpes progenitalis    Osteopenia    Hypothyroidism    IC (interstitial cystitis)    DUCTAL CARCINOMA IN SITU, RIGHT BREAST 03/12/2008   DIVERTICULOSIS OF COLON 03/12/2008    REFERRING DIAG: right breast cancer at risk for lymphedema  THERAPY DIAG: Aftercare following surgery for neoplasm  PERTINENT HISTORY: Patient was  diagnosed on 01/07/2021 with right grade II invasive ductal carcinoma breast cancer. It measures 1 cm with calcifications in the upper outer quadrant. It is ER/PR positive and HER2 negative with a Ki67 of 20%. She has a previous history of right breast DCIS with a lumpectomy and radiation in 2005. Bilateral mastectomies were performed on 02/26/2021 with 0/2 LN's on right and 0/1 LN on left.   PRECAUTIONS: right UE Lymphedema risk, None  SUBJECTIVE: Pt returns for her 3 month L-Dex screen.   PAIN:  Are you having pain? No  SOZO SCREENING: Patient was assessed today using the SOZO machine to determine the lymphedema index score. This was compared to her baseline score. It was determined that she is within the recommended range when compared to her baseline and no further action is needed at this time. She will continue SOZO screenings. These are done every 3 months for 2 years post operatively followed by every 6 months for 2 years, and then annually.   L-DEX FLOWSHEETS - 11/08/22 1000       L-DEX LYMPHEDEMA SCREENING   Measurement Type Unilateral    L-DEX MEASUREMENT EXTREMITY Upper Extremity    POSITION  Standing    DOMINANT SIDE Right    At Risk Side Right    BASELINE SCORE (UNILATERAL) 0.5    L-DEX SCORE (UNILATERAL) 1    VALUE CHANGE (UNILAT) 0.5               Hermenia Bers, PTA 11/08/2022, 10:39 AM

## 2022-11-16 DIAGNOSIS — G4700 Insomnia, unspecified: Secondary | ICD-10-CM | POA: Diagnosis not present

## 2022-11-16 DIAGNOSIS — F3340 Major depressive disorder, recurrent, in remission, unspecified: Secondary | ICD-10-CM | POA: Diagnosis not present

## 2022-12-21 NOTE — Progress Notes (Signed)
Patient Care Team: Merri Brunette, MD as PCP - General (Internal Medicine) Emelia Loron, MD as Consulting Physician (General Surgery) Serena Croissant, MD as Consulting Physician (Hematology and Oncology) Iva Boop, MD as Consulting Physician (Gastroenterology) Ihor Austin, NP as Nurse Practitioner (Neurology) Olivia Mackie, NP as Nurse Practitioner (Gynecology)  DIAGNOSIS: No diagnosis found.  SUMMARY OF ONCOLOGIC HISTORY: Oncology History  Malignant neoplasm of upper-outer quadrant of right breast in female, estrogen receptor positive (HCC)  02/04/2021 Initial Diagnosis   Screening mammogram: focal asymmetry and calcifications in the right breast. Diagnostic mammogram: irregular mass. Biopsy: invasive mammary carcinoma and mammary carcinoma in situ, ER+(90%)/PR+(70%)/Her2-.  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   02/11/2021 Cancer Staging   Staging form: Breast, AJCC 8th Edition - Clinical stage from 02/11/2021: Stage IA (cT1b, cN0, cM0, G2, ER+, PR+, HER2-) - Signed by Serena Croissant, MD on 02/11/2021 Stage prefix: Initial diagnosis Histologic grading system: 3 grade system Laterality: Right Staged by: Pathologist and managing physician Stage used in treatment planning: Yes National guidelines used in treatment planning: Yes Type of national guideline used in treatment planning: NCCN   02/17/2021 Genetic Testing   Negative hereditary cancer genetic testing: no pathogenic variants detected in [facility] BRCAPlus Panel or CancerNext-Expanded +RNAinsight Panel.  The report dates are February 17, 2021 and February 25, 2021, respectively.   The BRCAplus panel offered by [employer] and includes sequencing and deletion/duplication analysis for the following 8 genes: ATM, BRCA1, BRCA2, CDH1, CHEK2, PALB2, PTEN, and TP53.  The CancerNext-Expanded gene panel offered by [facility] and includes sequencing, rearrangement, and RNA analysis for the following 77  genes: AIP, ALK, APC, ATM, AXIN2, BAP1, BARD1, BLM, BMPR1A, BRCA1, BRCA2, BRIP1, CDC73, CDH1, CDK4, CDKN1B, CDKN2A, CHEK2, CTNNA1, DICER1, FANCC, FH, FLCN, GALNT12, KIF1B, LZTR1, MAX, MEN1, MET, MLH1, MSH2, MSH3, MSH6, MUTYH, NBN, NF1, NF2, NTHL1, PALB2, PHOX2B, PMS2, POT1, PRKAR1A, PTCH1, PTEN, RAD51C, RAD51D, RB1, RECQL, RET, SDHA, SDHAF2, SDHB, SDHC, SDHD, SMAD4, SMARCA4, SMARCB1, SMARCE1, STK11, SUFU, TMEM127, TP53, TSC1, TSC2, VHL and XRCC2 (sequencing and deletion/duplication); EGFR, EGLN1, HOXB13, KIT, MITF, PDGFRA, POLD1, and POLE (sequencing only); EPCAM and GREM1 (deletion/duplication only).    02/26/2021 Surgery   Left mastectomy: ALH, 1 lymph node benign Right mastectomy: Grade 2 IDC, 2.3 cm, intermediate grade DCIS, margins negative, 0/2 lymph nodes negative ER 90%, PR 70%, HER2 negative, Ki-67 20%   03/16/2021 Oncotype testing   Oncotype DX score 19: Distant recurrence at 9 years: 6%     CHIEF COMPLIANT: Follow-up of right breast cancer on letrozole   INTERVAL HISTORY: Jaclyn Lopez is a 72 y.o. with above-mentioned history of right breast cancer. She presents to the clinic today for a follow-up.    ALLERGIES:  is allergic to symmetrel [amantadine hcl], tamiflu [oseltamivir phosphate], and tegaderm ag mesh [silver].  MEDICATIONS:  Current Outpatient Medications  Medication Sig Dispense Refill   aspirin EC 81 MG EC tablet Take 1 tablet (81 mg total) by mouth daily. Swallow whole. 30 tablet 3   atorvastatin (LIPITOR) 40 MG tablet 1 tablet     Biotin 5 MG CAPS Take 5 mg by mouth daily.     buPROPion (WELLBUTRIN XL) 150 MG 24 hr tablet Take 150 mg by mouth every morning.     calcium carbonate (TUMS - DOSED IN MG ELEMENTAL CALCIUM) 500 MG chewable tablet Chew 1 tablet by mouth daily as needed for indigestion or heartburn.     cholecalciferol (VITAMIN D3) 25 MCG (1000 UNIT) tablet Take  1,000 Units by mouth daily.     DENTA 5000 PLUS 1.1 % CREA dental cream Take by mouth.      FLUoxetine (PROZAC) 20 MG capsule Take 20 mg by mouth daily.     ibuprofen (ADVIL) 200 MG tablet Take 200 mg by mouth every 6 (six) hours as needed for headache or moderate pain.     letrozole (FEMARA) 2.5 MG tablet TAKE 1 TABLET BY MOUTH EVERY DAY 90 tablet 3   levothyroxine (SYNTHROID, LEVOTHROID) 50 MCG tablet Take 50 mcg by mouth daily.     Methen-Hyosc-Meth Blue-Na Phos (ME/NAPHOS/MB/HYO1) 81.6 MG TABS Take 1 tablet by mouth daily as needed (urinary pain).     mirtazapine (REMERON) 15 MG tablet Take 3.75 mg by mouth at bedtime.      omeprazole (PRILOSEC) 20 MG capsule Take 1 capsule (20 mg total) by mouth daily. 90 capsule 3   traMADol (ULTRAM) 50 MG tablet Take 1 tablet (50 mg total) by mouth every 6 (six) hours as needed. 10 tablet 0   zaleplon (SONATA) 10 MG capsule Take 10 mg by mouth at bedtime as needed for sleep.     No current facility-administered medications for this visit.   Facility-Administered Medications Ordered in Other Visits  Medication Dose Route Frequency Provider Last Rate Last Admin   Spy Agent Green / Firefly Optime  1.25 mg Intravenous Once Emelia Loron, MD        PHYSICAL EXAMINATION: ECOG PERFORMANCE STATUS: {CHL ONC ECOG BM:[id number]}  There were no vitals filed for this visit. There were no vitals filed for this visit.  BREAST:*** No palpable masses or nodules in either right or left breasts. No palpable axillary supraclavicular or infraclavicular adenopathy no breast tenderness or nipple discharge. (exam performed in the presence of a chaperone)  LABORATORY DATA:  I have reviewed the data as listed    Latest Ref Rng & Units 04/29/2022   11:07 AM 02/11/2021   12:25 PM 08/19/2020   10:20 AM  CMP  Glucose 70 - 99 mg/dL 87  272    BUN 6 - 23 mg/dL 15  19    Creatinine 5.36 - 1.20 mg/dL 6.44  0.34    Sodium 742 - 145 mEq/L 141  138    Potassium 3.5 - 5.1 mEq/L 4.7  4.5    Chloride 96 - 112 mEq/L 105  104    CO2 19 - 32 mEq/L 30  24     Calcium 8.4 - 10.5 mg/dL 9.5  9.5    Total Protein 6.0 - 8.3 g/dL 6.9  7.0  6.6   Total Bilirubin 0.2 - 1.2 mg/dL 0.7  0.7  0.6   Alkaline Phos 39 - 117 U/L 129  116  103   AST 0 - 37 U/L 20  22  19    ALT 0 - 35 U/L 20  25  20      Lab Results  Component Value Date   WBC 7.6 04/29/2022   HGB 13.8 04/29/2022   HCT 41.7 04/29/2022   MCV 85.4 04/29/2022   PLT 203.0 04/29/2022   NEUTROABS 4.6 04/29/2022    ASSESSMENT & PLAN:  No problem-specific Assessment & Plan notes found for this encounter.    No orders of the defined types were placed in this encounter.  The patient has a good understanding of the overall plan. she agrees with it. she will call with any problems that may develop before the next visit here. Total time spent: 30 mins including  face to face time and time spent for planning, charting and co-ordination of care   Sherlyn Lick, CMA 12/21/22    I Janan Ridge am acting as a Neurosurgeon for [employer]  ***

## 2022-12-22 ENCOUNTER — Other Ambulatory Visit: Payer: Self-pay

## 2022-12-22 ENCOUNTER — Inpatient Hospital Stay: Payer: Medicare PPO | Attending: Hematology and Oncology | Admitting: Hematology and Oncology

## 2022-12-22 VITALS — BP 115/53 | HR 87 | Temp 97.5°F | Resp 18 | Ht 65.5 in | Wt 150.3 lb

## 2022-12-22 DIAGNOSIS — Z9013 Acquired absence of bilateral breasts and nipples: Secondary | ICD-10-CM | POA: Diagnosis not present

## 2022-12-22 DIAGNOSIS — C50411 Malignant neoplasm of upper-outer quadrant of right female breast: Secondary | ICD-10-CM | POA: Insufficient documentation

## 2022-12-22 DIAGNOSIS — Z79811 Long term (current) use of aromatase inhibitors: Secondary | ICD-10-CM | POA: Insufficient documentation

## 2022-12-22 DIAGNOSIS — Z170 Estrogen receptor positive status [ER+]: Secondary | ICD-10-CM | POA: Diagnosis not present

## 2022-12-22 MED ORDER — LETROZOLE 2.5 MG PO TABS: 2.5000 mg | ORAL_TABLET | Freq: Every day | ORAL | 3 refills | Status: DC

## 2022-12-22 NOTE — Assessment & Plan Note (Signed)
Bilateral mastectomies 02/26/2021 Left mastectomy: ALH, 1 lymph node benign Right mastectomy: Grade 2 IDC, 2.3 cm, intermediate grade DCIS, margins negative, 0/2 lymph nodes negative ER 90%, PR 70%, HER2 negative, Ki-67 20% Oncotype DX score 19: Distant recurrence at 9 years: 6%   Current treatment: Letrozole started November 2022 Letrozole toxicities:  Weight gain: She attributes this to letrozole Otherwise tolerating it well. She was wondering how much benefit she would get from the antiestrogen therapy.  I explained to her about the risks and benefits of antiestrogen therapy.   Breast cancer surveillance: 1.  Breast exam 12/22/2022: Benign 2. no role of imaging study since she had bilateral mastectomies She got married in May 2023   Return to clinic in 1 year for follow-up

## 2023-01-14 DIAGNOSIS — H903 Sensorineural hearing loss, bilateral: Secondary | ICD-10-CM | POA: Diagnosis not present

## 2023-01-14 DIAGNOSIS — H9313 Tinnitus, bilateral: Secondary | ICD-10-CM | POA: Diagnosis not present

## 2023-01-20 ENCOUNTER — Encounter: Payer: Medicare PPO | Admitting: Nurse Practitioner

## 2023-01-20 DIAGNOSIS — C50911 Malignant neoplasm of unspecified site of right female breast: Secondary | ICD-10-CM | POA: Diagnosis not present

## 2023-01-20 DIAGNOSIS — Z9012 Acquired absence of left breast and nipple: Secondary | ICD-10-CM | POA: Diagnosis not present

## 2023-02-07 ENCOUNTER — Ambulatory Visit: Payer: Medicare PPO | Attending: General Surgery

## 2023-02-07 VITALS — Wt 150.0 lb

## 2023-02-07 DIAGNOSIS — Z483 Aftercare following surgery for neoplasm: Secondary | ICD-10-CM | POA: Insufficient documentation

## 2023-02-07 NOTE — Therapy (Signed)
OUTPATIENT PHYSICAL THERAPY SOZO SCREENING NOTE   Patient Name: Jaclyn Lopez MRN: [id number] DOB:1951-02-24, 72 y.o., female Today's Date: 02/07/2023  PCP: Merri Brunette, MD REFERRING PROVIDER: Emelia Loron, MD   PT End of Session - 02/07/23 1105     Visit Number 6   # unchanged due to screen only   PT Start Time 1102    PT Stop Time 1107    PT Time Calculation (min) 5 min    Activity Tolerance Patient tolerated treatment well    Behavior During Therapy [facility] for tasks assessed/performed             Past Medical History:  Diagnosis Date   Atypical glandular cells on Pap smear 2003   Cancer [facility]) 2005   Breast-Ductal CIS Right breast-Radiation - no lymph nodes removed per pt   CKD (chronic kidney disease)    CVA (cerebral vascular accident) (HCC)    Depression    Family history of breast cancer 02/12/2021   Family history of prostate cancer 02/12/2021   GERD (gastroesophageal reflux disease)    Herpes progenitalis    History of COVID-19 10/15/2020   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 Subsequent LEEP showed CIN-2 with clear margins and negative ECC   Osteopenia 11/2018   T score -1.5 FRAX 9% / 1% improved at both hips stable at spine   Personal history of breast cancer 02/12/2021   PFO (patent foramen ovale)    PONV (postoperative nausea and vomiting)    Stroke (HCC) 11/2019   Thyroid disease    Hyperthyroid   Past Surgical History:  Procedure Laterality Date   Bladder stretch and Bx  1990   BREAST LUMPECTOMY  2005   right; cancer   BREAST LUMPECTOMY  2006   left; pre cancer   BREAST SURGERY     Reduction   BUNIONECTOMY  2007, 2011   CATARACT EXTRACTION  06/17/2020   CERVICAL CONE  BIOPSY  2003   CESAREAN SECTION  1983   CHOLECYSTECTOMY N/A 06/03/2022   Procedure: LAPAROSCOPIC CHOLECYSTECTOMY;  Surgeon: Emelia Loron, MD;  Location: [facility];  Service: General;  Laterality: N/A;   DILATION AND CURETTAGE OF UTERUS  2009   ENDOMETRIAL ABLATION     Novasure   GANGLION CYST EXCISION Left 1985   HYSTEROSCOPY  2009   LEEP  05/2015   CIN-2 with clear margins   MASTECTOMY W/ SENTINEL NODE BIOPSY Right 02/26/2021   Procedure: RIGHT MASTECTOMY WITH AXILLARY SENTINEL LYMPH NODE BIOPSY;  Surgeon: Emelia Loron, MD;  Location: [facility];  Service: General;  Laterality: Right;   MASTECTOMY, PARTIAL     MOUTH SURGERY  2012   implants   TOTAL MASTECTOMY Left 02/26/2021   Procedure: LEFT TOTAL MASTECTOMY;  Surgeon: Emelia Loron, MD;  Location: [facility];  Service: General;  Laterality: Left;   TUBAL LIGATION  1984   Patient Active Problem List   Diagnosis Date Noted   S/P bilateral mastectomy 02/26/2021   Genetic testing 02/18/2021   Family history of breast cancer 02/12/2021   Personal history of breast cancer 02/12/2021   Family history of prostate cancer 02/12/2021   Malignant neoplasm of upper-outer quadrant of  right breast in female, estrogen receptor positive (HCC) 02/04/2021   CVA (cerebral vascular accident) (HCC) 11/29/2019   Depression 11/29/2019   CKD (chronic kidney disease) stage 3, GFR 30-59 ml/min (HCC) 11/29/2019   Nausea and vomiting 03/15/2019   Gastroesophageal reflux disease 03/15/2019   Loose stools 03/15/2019   Elevated alkaline phosphatase level 03/15/2019   Herpes progenitalis    Osteopenia    Hypothyroidism    IC (interstitial cystitis)    DUCTAL CARCINOMA IN SITU, RIGHT BREAST 03/12/2008   DIVERTICULOSIS OF COLON 03/12/2008    REFERRING DIAG: right breast cancer at risk for lymphedema  THERAPY DIAG: Aftercare following surgery for neoplasm  PERTINENT HISTORY: Patient was  diagnosed on 01/07/2021 with right grade II invasive ductal carcinoma breast cancer. It measures 1 cm with calcifications in the upper outer quadrant. It is ER/PR positive and HER2 negative with a Ki67 of 20%. She has a previous history of right breast DCIS with a lumpectomy and radiation in 2005. Bilateral mastectomies were performed on 02/26/2021 with 0/2 LN's on right and 0/1 LN on left.   PRECAUTIONS: right UE Lymphedema risk, None  SUBJECTIVE: Pt returns for her last 3 month L-Dex screen.   PAIN:  Are you having pain? No  SOZO SCREENING: Patient was assessed today using the SOZO machine to determine the lymphedema index score. This was compared to her baseline score. It was determined that she is within the recommended range when compared to her baseline and no further action is needed at this time. She will continue SOZO screenings. These are done every 3 months for 2 years post operatively followed by every 6 months for 2 years, and then annually.   L-DEX FLOWSHEETS - 02/07/23 1100       L-DEX LYMPHEDEMA SCREENING   Measurement Type Unilateral    L-DEX MEASUREMENT EXTREMITY Upper Extremity    POSITION  Standing    DOMINANT SIDE Right    At Risk Side Right    BASELINE SCORE (UNILATERAL) 0.5    L-DEX SCORE (UNILATERAL) -1.8    VALUE CHANGE (UNILAT) -2.3            P: Begin 6 month L-Dex screens next.    Hermenia Bers, PTA 02/07/2023, 11:06 AM

## 2023-03-04 DIAGNOSIS — R748 Abnormal levels of other serum enzymes: Secondary | ICD-10-CM | POA: Diagnosis not present

## 2023-03-11 ENCOUNTER — Other Ambulatory Visit (HOSPITAL_BASED_OUTPATIENT_CLINIC_OR_DEPARTMENT_OTHER): Payer: Self-pay | Admitting: Internal Medicine

## 2023-03-11 DIAGNOSIS — E039 Hypothyroidism, unspecified: Secondary | ICD-10-CM | POA: Diagnosis not present

## 2023-03-11 DIAGNOSIS — Z0000 Encounter for general adult medical examination without abnormal findings: Secondary | ICD-10-CM | POA: Diagnosis not present

## 2023-03-11 DIAGNOSIS — G4700 Insomnia, unspecified: Secondary | ICD-10-CM | POA: Diagnosis not present

## 2023-03-11 DIAGNOSIS — N3010 Interstitial cystitis (chronic) without hematuria: Secondary | ICD-10-CM | POA: Diagnosis not present

## 2023-03-11 DIAGNOSIS — K582 Mixed irritable bowel syndrome: Secondary | ICD-10-CM | POA: Diagnosis not present

## 2023-03-11 DIAGNOSIS — N1832 Chronic kidney disease, stage 3b: Secondary | ICD-10-CM | POA: Diagnosis not present

## 2023-03-11 DIAGNOSIS — Z8673 Personal history of transient ischemic attack (TIA), and cerebral infarction without residual deficits: Secondary | ICD-10-CM

## 2023-03-11 DIAGNOSIS — Z23 Encounter for immunization: Secondary | ICD-10-CM | POA: Diagnosis not present

## 2023-03-11 DIAGNOSIS — K219 Gastro-esophageal reflux disease without esophagitis: Secondary | ICD-10-CM | POA: Diagnosis not present

## 2023-03-11 DIAGNOSIS — F3340 Major depressive disorder, recurrent, in remission, unspecified: Secondary | ICD-10-CM | POA: Diagnosis not present

## 2023-03-14 ENCOUNTER — Telehealth: Payer: Self-pay | Admitting: Adult Health

## 2023-03-14 NOTE — Telephone Encounter (Signed)
Pt said, Dr.Ahern was  recommended by Dr. Merri Brunette for a balance issues. Informed pt would need a referral for Dr. Renne Crigler.

## 2023-03-25 ENCOUNTER — Encounter: Payer: Self-pay | Admitting: Physician Assistant

## 2023-03-25 ENCOUNTER — Encounter: Payer: Self-pay | Admitting: Internal Medicine

## 2023-03-30 DIAGNOSIS — N1832 Chronic kidney disease, stage 3b: Secondary | ICD-10-CM | POA: Diagnosis not present

## 2023-03-30 DIAGNOSIS — E7800 Pure hypercholesterolemia, unspecified: Secondary | ICD-10-CM | POA: Diagnosis not present

## 2023-03-30 DIAGNOSIS — K219 Gastro-esophageal reflux disease without esophagitis: Secondary | ICD-10-CM | POA: Diagnosis not present

## 2023-04-01 DIAGNOSIS — G319 Degenerative disease of nervous system, unspecified: Secondary | ICD-10-CM | POA: Diagnosis not present

## 2023-04-01 DIAGNOSIS — I63521 Cerebral infarction due to unspecified occlusion or stenosis of right anterior cerebral artery: Secondary | ICD-10-CM | POA: Diagnosis not present

## 2023-04-04 DIAGNOSIS — C50911 Malignant neoplasm of unspecified site of right female breast: Secondary | ICD-10-CM | POA: Diagnosis not present

## 2023-04-04 DIAGNOSIS — Z9012 Acquired absence of left breast and nipple: Secondary | ICD-10-CM | POA: Diagnosis not present

## 2023-04-08 ENCOUNTER — Encounter (HOSPITAL_BASED_OUTPATIENT_CLINIC_OR_DEPARTMENT_OTHER): Payer: Self-pay

## 2023-04-08 ENCOUNTER — Other Ambulatory Visit (HOSPITAL_BASED_OUTPATIENT_CLINIC_OR_DEPARTMENT_OTHER): Payer: Medicare PPO

## 2023-04-18 ENCOUNTER — Encounter: Payer: Medicare PPO | Admitting: Nurse Practitioner

## 2023-04-27 ENCOUNTER — Telehealth: Payer: Self-pay | Admitting: Adult Health

## 2023-04-27 NOTE — Telephone Encounter (Signed)
Pt called wanting to know when her CT Scan results will be explained to her. She states Dr. Renne Crigler has sent the results to our medical records. Please advise.

## 2023-05-02 NOTE — Telephone Encounter (Signed)
Dr Renne Crigler ordered a CT cardiac scoring test. I don't see any other CT image that was ordered. We do not read these and the result would have to come from the ordering physcian so Dr Renne Crigler should explain the results to the pt. I do see where a referral was sent for [place] and Dr Lucia Gaskins is requesting that the pt is seen by Dr Pearlean Brownie.

## 2023-05-03 NOTE — Telephone Encounter (Signed)
Noted  

## 2023-05-25 ENCOUNTER — Ambulatory Visit: Payer: Medicare PPO | Admitting: Physician Assistant

## 2023-05-25 ENCOUNTER — Encounter: Payer: Self-pay | Admitting: Physician Assistant

## 2023-05-25 ENCOUNTER — Other Ambulatory Visit: Payer: Medicare PPO

## 2023-05-25 VITALS — BP 104/60 | HR 82 | Ht 65.5 in | Wt 152.4 lb

## 2023-05-25 DIAGNOSIS — R1310 Dysphagia, unspecified: Secondary | ICD-10-CM | POA: Diagnosis not present

## 2023-05-25 DIAGNOSIS — Z860101 Personal history of adenomatous and serrated colon polyps: Secondary | ICD-10-CM

## 2023-05-25 DIAGNOSIS — R748 Abnormal levels of other serum enzymes: Secondary | ICD-10-CM

## 2023-05-25 DIAGNOSIS — K219 Gastro-esophageal reflux disease without esophagitis: Secondary | ICD-10-CM | POA: Diagnosis not present

## 2023-05-25 DIAGNOSIS — K449 Diaphragmatic hernia without obstruction or gangrene: Secondary | ICD-10-CM | POA: Diagnosis not present

## 2023-05-25 DIAGNOSIS — R1110 Vomiting, unspecified: Secondary | ICD-10-CM

## 2023-05-25 DIAGNOSIS — R194 Change in bowel habit: Secondary | ICD-10-CM

## 2023-05-25 DIAGNOSIS — R195 Other fecal abnormalities: Secondary | ICD-10-CM

## 2023-05-25 DIAGNOSIS — R1319 Other dysphagia: Secondary | ICD-10-CM

## 2023-05-25 LAB — COMPREHENSIVE METABOLIC PANEL
ALT: 16 U/L (ref 0–35)
AST: 20 U/L (ref 0–37)
Albumin: 4.6 g/dL (ref 3.5–5.2)
Alkaline Phosphatase: 120 U/L — ABNORMAL HIGH (ref 39–117)
BUN: 16 mg/dL (ref 6–23)
CO2: 27 meq/L (ref 19–32)
Calcium: 9.6 mg/dL (ref 8.4–10.5)
Chloride: 103 meq/L (ref 96–112)
Creatinine, Ser: 1.13 mg/dL (ref 0.40–1.20)
GFR: 49.08 mL/min — ABNORMAL LOW (ref >60.00)
Glucose, Bld: 85 mg/dL (ref 70–99)
Potassium: 4.6 meq/L (ref 3.5–5.1)
Sodium: 140 meq/L (ref 135–145)
Total Bilirubin: 0.5 mg/dL (ref 0.2–1.2)
Total Protein: 7.2 g/dL (ref 6.0–8.3)

## 2023-05-25 LAB — CBC WITH DIFFERENTIAL/PLATELET
Basophils Absolute: 0.1 10*3/uL (ref 0.0–0.1)
Basophils Relative: 1.1 % (ref 0.0–3.0)
Eosinophils Absolute: 0.1 10*3/uL (ref 0.0–0.7)
Eosinophils Relative: 1.9 % (ref 0.0–5.0)
HCT: 42.2 % (ref 36.0–46.0)
Hemoglobin: 14 g/dL (ref 12.0–15.0)
Lymphocytes Relative: 28.8 % (ref 12.0–46.0)
Lymphs Abs: 1.9 10*3/uL (ref 0.7–4.0)
MCHC: 33.2 g/dL (ref 30.0–36.0)
MCV: 86.4 fL (ref 78.0–100.0)
Monocytes Absolute: 0.6 10*3/uL (ref 0.1–1.0)
Monocytes Relative: 9.6 % (ref 3.0–12.0)
Neutro Abs: 3.9 10*3/uL (ref 1.4–7.7)
Neutrophils Relative %: 58.6 % (ref 43.0–77.0)
Platelets: 199 10*3/uL (ref 150.0–400.0)
RBC: 4.88 Mil/uL (ref 3.87–5.11)
RDW: 14.6 % (ref 11.5–15.5)
WBC: 6.6 10*3/uL (ref 4.0–10.5)

## 2023-05-25 NOTE — Progress Notes (Signed)
 05/25/2023 Jaclyn Lopez [id number] Oct 13, 1950  Referring provider: Clarice Nottingham, MD Primary GI doctor: Dr. Avram  ASSESSMENT AND PLAN:   Gastroesophageal reflux disease with history 5 cm hiatal hernia, last EGD 2021, now with some mid esophageal dysphagia with occasional regurgitation Will schedule EGD with possible dilation with Dr. Avram at [facility] to evaluate for esophageal stenosis, potential complications from large hiatal hernia including obstruction. I discussed risks of EGD with patient today, including risk of sedation, bleeding or perforation.  Patient provides understanding and gave verbal consent to proceed. Patient is been taking Protonix  40 mg daily but will change sure she take it 30 minutes an hour before food, Pepcid as needed. Reflux lifestyle discussed and handout given Pending results of EGD patient may need upper GI versus manometry if hiatal hernia repair once to be pursued.  Loose stools occasional alternating diarrhea constipation Continue fiber supplements Toileting habits discussed with the patient and handout given  History of adenomatous polyps Recall 05/2024  History of elevated alk phos Unremarkable hepatocellular workup Unremarkable liver biopsy 2021 Status post cholecystectomy 05/2022 Repeat c-Met  Patient Care Team: Clarice Nottingham, MD as PCP - General (Internal Medicine) Ebbie Cough, MD as Consulting Physician (General Surgery) Odean Potts, MD as Consulting Physician (Hematology and Oncology) Avram Lupita BRAVO, MD as Consulting Physician (Gastroenterology) Whitfield Raisin, NP as Nurse Practitioner (Neurology) Prentiss Annabella LABOR, NP as Nurse Practitioner (Gynecology)  HISTORY OF PRESENT ILLNESS: 73 y.o. female with a past medical history of right breast cancer 2005 s/p radiation and lumpectomy, 2022 recurrence status post bilateral mastectomy, genetic testing negative, CKD, depression, CVA 2021, echo PFO present, EF 55 to 60%  no AAS, thyroid  disease, diverticulosis, GERD  and others listed below presents for evaluation of IBS and GERD.   05/29/19 colonoscopy for diarrhea negative random biopsies for microscopic colitis, 4 subcentimeter adenomatous polyps, normal TI, diverticulosis recall 05/2024 05/29/19 EGD 5 cm hiatal hernia  History of alkaline phosphatase, positive GGT, ultrasound unremarkable, repeat labs 03/2020 showed ANA positive, ASMA and AMA negative.  IgG negative. Negative hepatitis panels. 02/01/2020 abdominal ultrasound cholelithiasis without evidence of cholecystitis.   No ductal dilatation. 03/28/2020 status post liver biopsy for elevated alk phos and ALT.   No fibrosis, minimal changes but overall healthy. 04/28/2022 abdominal ultrasound showed cholelithiasis with mild gallbladder wall thickening and positive Murphy sign suspicious for acute cholecystitis 06/03/2022 laparoscopic cholecystectomy with Dr. Ebbie Pathology showed chronic cholecystitis and cholelithiasis  Patient presents for follow-up She states she is having more reflux, dependent on what she eats. Worse with fatty foods/beef, can feel beef sitting in her mid esophagus, has gotten up in the night and have vomited the food she ate for dinner. Can happen once or twice a week. Can also have nocturnal nausea without vomiting at times, will take a tums and can help. She has tried in last week to cut out sugar and flour.  She is on pantoprazole  40 mg daily, she takes it first thing in the morning after breakfast which is normally fruit.   Wt Readings from Last 3 Encounters:  05/25/23 152 lb 6.4 oz (69.1 kg)  02/07/23 150 lb (68 kg)  12/22/22 150 lb 4.8 oz (68.2 kg)   She  reports that she quit smoking about 27 years ago. Her smoking use included cigarettes. She has never used smokeless tobacco. She reports that she does not currently use alcohol. She reports that she does not use drugs.  Current Medications:   Current Outpatient  Medications (  Endocrine & Metabolic):    levothyroxine  (SYNTHROID , LEVOTHROID) 50 MCG tablet, Take 50 mcg by mouth daily.   Current Outpatient Medications (Cardiovascular):    atorvastatin  (LIPITOR ) 40 MG tablet, 1 tablet     Current Outpatient Medications (Analgesics):    aspirin  EC 81 MG EC tablet, Take 1 tablet (81 mg total) by mouth daily. Swallow whole.   ibuprofen (ADVIL) 200 MG tablet, Take 200 mg by mouth every 6 (six) hours as needed for headache or moderate pain.     Current Outpatient Medications (Other):    Biotin 5 MG CAPS, Take 5 mg by mouth daily.   buPROPion  (WELLBUTRIN  XL) 150 MG 24 hr tablet, Take 150 mg by mouth every morning.   calcium  carbonate (TUMS - DOSED IN MG ELEMENTAL CALCIUM ) 500 MG chewable tablet, Chew 1 tablet by mouth daily as needed for indigestion or heartburn.   cholecalciferol (VITAMIN D3) 25 MCG (1000 UNIT) tablet, Take 1,000 Units by mouth daily.   DENTA 5000 PLUS 1.1 % CREA dental cream, Take by mouth.   FLUoxetine  (PROZAC ) 20 MG capsule, Take 20 mg by mouth daily.   letrozole  (FEMARA ) 2.5 MG tablet, Take 1 tablet (2.5 mg total) by mouth daily.   mirtazapine  (REMERON ) 15 MG tablet, Take 3.75 mg by mouth at bedtime.    zaleplon (SONATA) 10 MG capsule, Take 10 mg by mouth at bedtime as needed for sleep.   omeprazole  (PRILOSEC ) 20 MG capsule, Take 1 capsule (20 mg total) by mouth daily.   Facility-Administered Medications Ordered in Other Visits (Other):    Spy Agent Green / Firefly Optime No current facility-administered medications for this visit.  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 Subsequent LEEP showed CIN-2 with clear margins and negative ECC   Osteopenia 11/2018   T score -1.5 FRAX 9% / 1% improved at both hips stable at spine   Personal history of breast cancer 02/12/2021   PFO (patent foramen ovale)    PONV (postoperative nausea and vomiting)    Stroke (HCC) 11/2019   Thyroid  disease    Hyperthyroid   Allergies:  Allergies  Allergen Reactions   Oseltamivir Hives   Symmetrel [Amantadine Hcl] Hives   Tamiflu [Oseltamivir Phosphate] Hives   Tegaderm Ag Mesh [Silver]     Contact dermatitis     Surgical History:  She  has a past surgical history that includes Cesarean section (8016); Tubal ligation (1984); Bunionectomy (2007, 2011); Endometrial ablation; Dilation and curettage of uterus (2009); Hysteroscopy (2009); Ganglion cyst excision (Left, 1985); Cervical cone biopsy (2003); Bladder stretch and Bx (1990); Mouth surgery (2012); Breast lumpectomy (2005); Breast lumpectomy (2006); Breast surgery; LEEP (05/2015); Mastectomy, partial; Cataract extraction (06/17/2020); Mastectomy  w/ sentinel node biopsy (Right, 02/26/2021); Total mastectomy (Left, 02/26/2021); and Cholecystectomy (N/A, 06/03/2022). Family History:  Her family history includes Bipolar disorder in her mother; Breast cancer (age of onset: 77) in her maternal grandmother; COPD in her mother; Dementia in her mother; Lung cancer (age of onset: 23) in her father; Prostate cancer in her maternal uncle.  REVIEW OF SYSTEMS  : All other systems reviewed and negative except where noted in the History of Present  Illness.  PHYSICAL EXAM: BP 104/60   Pulse 82   Ht 5' 5.5 (1.664 m)   Wt 152 lb 6.4 oz (69.1 kg)   BMI 24.97 kg/m  General:   Pleasant, well developed female in no acute distress Head:   Normocephalic and atraumatic. Eyes:  sclerae anicteric,conjunctive pink  Heart:   regular rate and rhythm Pulm:  Clear anteriorly; no wheezing Abdomen:   Soft, Obese AB, Active bowel sounds. No tenderness. Without guarding and Without rebound, No organomegaly appreciated. Rectal: Not evaluated Extremities:  Without edema. Msk: Symmetrical without gross deformities. Peripheral pulses intact.  Neurologic:  Alert and  oriented x4;  No focal deficits.  Skin:   Dry and intact without significant lesions or rashes. Psychiatric:  Cooperative. Normal mood and affect.  RELEVANT LABS AND IMAGING: CBC    Component Value Date/Time   WBC 7.6 04/29/2022 1107   RBC 4.89 04/29/2022 1107   HGB 13.8 04/29/2022 1107   HGB 13.5 02/11/2021 1225   HCT 41.7 04/29/2022 1107   PLT 203.0 04/29/2022 1107   PLT 206 02/11/2021 1225   MCV 85.4 04/29/2022 1107   MCH 29.3 02/11/2021 1225   MCHC 33.0 04/29/2022 1107   RDW 14.5 04/29/2022 1107   LYMPHSABS 2.1 04/29/2022 1107   MONOABS 0.6 04/29/2022 1107   EOSABS 0.2 04/29/2022 1107   BASOSABS 0.1 04/29/2022 1107    CMP     Component Value Date/Time   NA 141 04/29/2022 1107   K 4.7 04/29/2022 1107   CL 105 04/29/2022 1107   CO2 30 04/29/2022 1107   GLUCOSE 87 04/29/2022 1107   BUN 15 04/29/2022 1107   CREATININE 1.21 (H) 04/29/2022 1107   CREATININE 1.14 (H) 02/11/2021 1225   CALCIUM  9.5 04/29/2022 1107   PROT 6.9 04/29/2022 1107   ALBUMIN 4.4 04/29/2022 1107   AST 20 04/29/2022 1107   AST 22 02/11/2021 1225   ALT 20 04/29/2022 1107   ALT 25 02/11/2021 1225   ALKPHOS 129 (H) 04/29/2022 1107   BILITOT 0.7 04/29/2022 1107   BILITOT 0.7 02/11/2021 1225   GFRNONAA 52 (L) 02/11/2021 1225   GFRAA 53 (L) 11/30/2019 1052     Jaclyn JONELLE Coombs, PA-C 10:00  AM

## 2023-05-25 NOTE — Patient Instructions (Addendum)
 You have been scheduled for an endoscopy. Please follow written instructions given to you at your visit today.  If you use inhalers (even only as needed), please bring them with you on the day of your procedure.  If you take any of the following medications, they will need to be adjusted prior to your procedure:   DO NOT TAKE 7 DAYS PRIOR TO TEST- Trulicity (dulaglutide) Ozempic, Wegovy (semaglutide) Mounjaro (tirzepatide) Bydureon Bcise (exanatide extended release)  DO NOT TAKE 1 DAY PRIOR TO YOUR TEST Rybelsus (semaglutide) Adlyxin (lixisenatide) Victoza (liraglutide) Byetta (exanatide) _________________________________________________________   Please take your proton pump inhibitor medication, protonix  40 mg  Please take this medication 30 minutes to 1 hour before meals- this makes it more effective. Can try before lunch or dinner to avoid your synthroid  Avoid spicy and acidic foods Avoid fatty foods Limit your intake of coffee, tea, alcohol, and carbonated drinks Work to maintain a healthy weight Keep the head of the bed elevated at least 3 inches with blocks or a wedge pillow if you are having any nighttime symptoms Stay upright for 2 hours after eating Avoid meals and snacks three to four hours before bedtime Can take pepcid or famotidine as needed  Here some information about pelvic floor dysfunction. This may be contributing to some of your symptoms.  FIBER SUPPLEMENT You can do metamucil or fibercon once or twice a day but if this causes gas/bloating please switch to Benefiber or Citracel.  Fiber is good for constipation/diarrhea/irritable bowel syndrome.  It can also help with weight loss and can help lower your bad cholesterol (LDL).  Please do 1 TBSP in the morning in water, coffee, or tea.  It can take up to a month before you can see a difference with your bowel movements.  It is cheapest from [employer], [employer], [employer].   We will continue with our evaluation  but I do want you to consider adding on fiber supplement with low-dose MiraLAX daily. We could also refer to pelvic floor physical therapy.  Toileting tips to help with your constipation - Drink at least 64-80 ounces of water/liquid per day. - Establish a time to try to move your bowels every day.  For many people, this is after a cup of coffee or after a meal such as breakfast. - Sit all of the way back on the toilet keeping your back fairly straight and while sitting up, try to rest the tops of your forearms on your upper thighs.   - Raising your feet with a step stool/squatty potty can be helpful to improve the angle that allows your stool to pass through the rectum. - Relax the rectum feeling it bulge toward the toilet water.  If you feel your rectum raising toward your body, you are contracting rather than relaxing. - Breathe in and slowly exhale. Belly breath by expanding your belly towards your belly button. Keep belly expanded as you gently direct pressure down and back to the anus.  A low pitched GRRR sound can assist with increasing intra-abdominal pressure.  (Can also trying to blow on a pinwheel and make it move, this helps with the same belly breathing) - Repeat 3-4 times. If unsuccessful, contract the pelvic floor to restore normal tone and get off the toilet.  Avoid excessive straining. - To reduce excessive wiping by teaching your anus to normally contract, place hands on outer aspect of knees and resist knee movement outward.  Hold 5-10 second then place hands just inside of knees  and resist inward movement of knees.  Hold 5 seconds.  Repeat a few times each way.  Go to the ER if unable to pass gas, severe AB pain, unable to hold down food, any shortness of breath of chest pain.    Pelvic Floor Dysfunction, Female Pelvic floor dysfunction (PFD) is a condition that results when the group of muscles and connective tissues that support the organs in the pelvis (pelvic floor  muscles) do not work well. These muscles and their connections form a sling that supports the colon and bladder. In women, they also support the uterus. PFD causes pelvic floor muscles to be too weak, too tight, or both. In PFD, muscle movements are not coordinated. This may cause bowel or bladder problems. It may also cause pain. What are the causes? This condition may be caused by an injury to the pelvic area or by a weakening of pelvic muscles. This often results from pregnancy and childbirth or other types of strain. In many cases, the exact cause is not known. What increases the risk? The following factors may make you more likely to develop this condition: Having chronic bladder tissue inflammation (interstitial cystitis). Being an older person. Being overweight. History of radiation treatment for cancer in the pelvic region. Previous pelvic surgery, such as removal of the uterus (hysterectomy). What are the signs or symptoms? Symptoms of this condition vary and may include: Bladder symptoms, such as: Trouble starting urination and emptying the bladder. Frequent urinary tract infections. Leaking urine when coughing, laughing, or exercising (stress incontinence). Having to pass urine urgently or frequently. Pain when passing urine. Bowel symptoms, such as: Constipation. Urgent or frequent bowel movements. Incomplete bowel movements. Painful bowel movements. Leaking stool or gas. Unexplained genital or rectal pain. Genital or rectal muscle spasms. Low back pain. Other symptoms may include: A heavy, full, or aching feeling in the vagina. A bulge that protrudes into the vagina. Pain during or after sex. How is this diagnosed? This condition may be diagnosed based on: Your symptoms and medical history. A physical exam. During the exam, your health care provider may check your pelvic muscles for tightness, spasm, pain, or weakness. This may include a rectal exam and a pelvic  exam. In some cases, you may have diagnostic tests, such as: Electrical muscle function tests. Urine flow testing. X-ray tests of bowel function. Ultrasound of the pelvic organs. How is this treated? Treatment for this condition depends on the symptoms. Treatment options include: Physical therapy. This may include Kegel exercises to help relax or strengthen the pelvic floor muscles. Biofeedback. This type of therapy provides feedback on how tight your pelvic floor muscles are so that you can learn to control them. Internal or external massage therapy. A treatment that involves electrical stimulation of the pelvic floor muscles to help control pain (transcutaneous electrical nerve stimulation, or TENS). Sound wave therapy (ultrasound) to reduce muscle spasms. Medicines, such as: Muscle relaxants. Bladder control medicines. Surgery to reconstruct or support pelvic floor muscles may be an option if other treatments do not help. Follow these instructions at home: Activity Do your usual activities as told by your health care provider. Ask your health care provider if you should modify any activities. Do pelvic floor strengthening or relaxing exercises at home as told by your physical therapist. Lifestyle Maintain a healthy weight. Eat foods that are high in fiber, such as beans, whole grains, and fresh fruits and vegetables. Limit foods that are high in fat and processed sugars, such as  fried or sweet foods. Manage stress with relaxation techniques such as yoga or meditation. General instructions If you have problems with leakage: Use absorbable pads or wear padded underwear. Wash frequently with mild soap. Keep your genital and anal area as clean and dry as possible. Ask your health care provider if you should try a barrier cream to prevent skin irritation. Take warm baths to relieve pelvic muscle tension or spasms. Take over-the-counter and prescription medicines only as told by your  health care provider. Keep all follow-up visits. How is this prevented? The cause of PFD is not always known, but there are a few things you can do to reduce the risk of developing this condition, including: Staying at a healthy weight. Getting regular exercise. Managing stress. Contact a health care provider if: Your symptoms are not improving with home care. You have signs or symptoms of PFD that get worse at home. You develop new signs or symptoms. You have signs of a urinary tract infection, such as: Fever. Chills. Increased urinary frequency. A burning feeling when urinating. You have not had a bowel movement in 3 days (constipation). Summary Pelvic floor dysfunction results when the muscles and connective tissues in your pelvic floor do not work well. These muscles and their connections form a sling that supports your colon and bladder. In women, they also support the uterus. PFD may be caused by an injury to the pelvic area or by a weakening of pelvic muscles. PFD causes pelvic floor muscles to be too weak, too tight, or a combination of both. Symptoms may vary from person to person. In most cases, PFD can be treated with physical therapies and medicines. Surgery may be an option if other treatments do not help. This information is not intended to replace advice given to you by your health care provider. Make sure you discuss any questions you have with your health care provider. Document Revised: 09/10/2020 Document Reviewed: 09/10/2020 Elsevier Patient Education  2022 [employer].

## 2023-06-05 ENCOUNTER — Other Ambulatory Visit: Payer: Self-pay | Admitting: Physician Assistant

## 2023-06-05 DIAGNOSIS — R140 Abdominal distension (gaseous): Secondary | ICD-10-CM

## 2023-06-05 DIAGNOSIS — R748 Abnormal levels of other serum enzymes: Secondary | ICD-10-CM

## 2023-06-06 ENCOUNTER — Encounter: Payer: Self-pay | Admitting: Internal Medicine

## 2023-06-10 ENCOUNTER — Ambulatory Visit (AMBULATORY_SURGERY_CENTER): Payer: Medicare PPO | Admitting: Internal Medicine

## 2023-06-10 ENCOUNTER — Encounter: Payer: Self-pay | Admitting: Internal Medicine

## 2023-06-10 VITALS — BP 107/64 | HR 67 | Temp 98.2°F | Resp 20 | Ht 65.5 in | Wt 152.0 lb

## 2023-06-10 DIAGNOSIS — K219 Gastro-esophageal reflux disease without esophagitis: Secondary | ICD-10-CM

## 2023-06-10 DIAGNOSIS — K449 Diaphragmatic hernia without obstruction or gangrene: Secondary | ICD-10-CM | POA: Diagnosis not present

## 2023-06-10 DIAGNOSIS — Q399 Congenital malformation of esophagus, unspecified: Secondary | ICD-10-CM | POA: Diagnosis not present

## 2023-06-10 DIAGNOSIS — K226 Gastro-esophageal laceration-hemorrhage syndrome: Secondary | ICD-10-CM | POA: Diagnosis not present

## 2023-06-10 DIAGNOSIS — R1310 Dysphagia, unspecified: Secondary | ICD-10-CM | POA: Diagnosis not present

## 2023-06-10 DIAGNOSIS — R1319 Other dysphagia: Secondary | ICD-10-CM

## 2023-06-10 MED ORDER — SODIUM CHLORIDE 0.9 % IV SOLN: 500.0000 mL | Freq: Once | INTRAVENOUS | Status: AC

## 2023-06-10 NOTE — Progress Notes (Signed)
Drowsy, VSS, resps reg and even. Report to RN

## 2023-06-10 NOTE — Op Note (Signed)
[facility] Patient Name: Jaclyn Lopez Procedure Date: 06/10/2023 10:27 AM MRN: [id number] Endoscopist: Iva Boop , MD, [id number] Age: 73 Referring MD:  Date of Birth: [birth year] Gender: Female Account #: [id number] Procedure:                Upper GI endoscopy Indications:              Dysphagia, Suspected esophageal reflux Medicines:                Monitored Anesthesia Care Procedure:                Pre-Anesthesia Assessment:                           - Prior to the procedure, a History and Physical                            was performed, and patient medications and                            allergies were reviewed. The patient's tolerance of                            previous anesthesia was also reviewed. The risks                            and benefits of the procedure and the sedation                            options and risks were discussed with the patient.                            All questions were answered, and informed consent                            was obtained. Prior Anticoagulants: The patient has                            taken no anticoagulant or antiplatelet agents. ASA                            Grade Assessment: II - A patient with mild systemic                            disease. After reviewing the risks and benefits,                            the patient was deemed in satisfactory condition to                            undergo the procedure.                           After obtaining informed consent, the endoscope was  passed under direct vision. Throughout the                            procedure, the patient's blood pressure, pulse, and                            oxygen saturations were monitored continuously. The                            GIF [id number] #[id number] was introduced through the                            mouth, and advanced to the second part of duodenum.                            The  upper GI endoscopy was accomplished without                            difficulty. The patient tolerated the procedure                            well. Scope In: Scope Out: Findings:                 The examined esophagus was mildly tortuous.                           A 5 cm hiatal hernia was present.                           The gastroesophageal flap valve was visualized                            endoscopically and classified as Hill Grade III                            (minimal fold, loose to endoscope, hiatal hernia                            likely).                           The exam was otherwise without abnormality.                           The cardia and gastric fundus were otherwise normal                            on retroflexion.                           A TTS dilator was passed through the scope.                            Dilation with an 18-19-20 mm balloon dilator was  performed to 20 mm at the gastroesophageal                            junction. Estimated blood loss: none. No change to                            mucosa. Complications:            Mild mucosal tear in hiatal hernia sac noted after                            gastric retroflexion - ooze of blood slowed and                            stopping. Estimated Blood Loss:     Estimated blood loss was minimal. Impression:               - Tortuous esophagus.                           - 5 cm hiatal hernia.                           - Gastroesophageal flap valve classified as Hill                            Grade III (minimal fold, loose to endoscope, hiatal                            hernia likely).                           - The examination was otherwise normal. Mild                            mucosal tear in hernia sac - iatrogenic                           - No specimens collected. 20 mm balloon dilation at                            GE junction given dysphagia - no change so no sig                             stenosis and no cause of dysphagia sxs Recommendation:           - Patient has a contact number available for                            emergencies. The signs and symptoms of potential                            delayed complications were discussed with the                            patient.  Return to normal activities tomorrow.                            Written discharge instructions were provided to the                            patient.                           - Continue present medications. omeprtazole 20 mg                            started recently - could need higher dose                           - GERD prevention diet.                           - MY OFFICE WILL ORDER A BARIUM SWALLOW WITH TABLET                            - DX ESOPHAGEAL DYSPHAGIA, HIATAL HERNIA, GERD Iva Boop, MD 06/10/2023 10:57:45 AM This report has been signed electronically.

## 2023-06-10 NOTE — Progress Notes (Signed)
Pt's states no medical or surgical changes since previsit or office visit.

## 2023-06-10 NOTE — Patient Instructions (Addendum)
I saw the hiatal hernia as before - I dilated where the esophagus and stomach meet to see if that needed to be opened up but but the dilation did not change anything.  There was a slight tear from the scope in the hernia sac but that should heal on its own.  Stay on the omeprazole. Strict GERD - acid reflux diet and precautions (see handout).  I am ordering an xray called a barium swallow to evaluate further.  I appreciate the opportunity to care for you. Iva Boop, MD, FACG  YOU HAD AN ENDOSCOPIC PROCEDURE TODAY AT THE [facility]:   Refer to the procedure report that was given to you for any specific questions about what was found during the examination.  If the procedure report does not answer your questions, please call your gastroenterologist to clarify.  If you requested that your care partner not be given the details of your procedure findings, then the procedure report has been included in a sealed envelope for you to review at your convenience later.  YOU SHOULD EXPECT: Some feelings of bloating in the abdomen. Passage of more gas than usual.  Walking can help get rid of the air that was put into your GI tract during the procedure and reduce the bloating. If you had a lower endoscopy (such as a colonoscopy or flexible sigmoidoscopy) you may notice spotting of blood in your stool or on the toilet paper. If you underwent a bowel prep for your procedure, you may not have a normal bowel movement for a few days.  Please Note:  You might notice some irritation and congestion in your nose or some drainage.  This is from the oxygen used during your procedure.  There is no need for concern and it should clear up in a day or so.  SYMPTOMS TO REPORT IMMEDIATELY:   Following upper endoscopy (EGD)  Vomiting of blood or coffee ground material  New chest pain or pain under the shoulder blades  Painful or persistently difficult swallowing  New shortness of breath  Fever of 100F  or higher  Black, tarry-looking stools  For urgent or emergent issues, a gastroenterologist can be reached at any hour by calling [phone]. Do not use MyChart messaging for urgent concerns.    DIET:  We do recommend a small meal at first, but then you may proceed to your regular diet.  Drink plenty of fluids but you should avoid alcoholic beverages for 24 hours.  MEDICATIONS: Continue present medications. Omeprazole 20mg  was started recently - could need higer dose.  Please see handouts given to you by your recovery nurse.  FOLLOW UP: My office will order a Barium Swallow with tablet. Dr. Marvell Fuller office nurse will call you to schedule this appointment - DX is Esophageal Dysphagia, Hiatal Hernia, GERD.  Thank you for allowing [place] to provide for your healthcare needs today.  ACTIVITY:  You should plan to take it easy for the rest of today and you should NOT DRIVE or use heavy machinery until tomorrow (because of the sedation medicines used during the test).    FOLLOW UP: Our staff will call the number listed on your records the next business day following your procedure.  We will call around 7:15- 8:00 am to check on you and address any questions or concerns that you may have regarding the information given to you following your procedure. If we do not reach you, we will leave a message.  If any biopsies were taken you will be contacted by phone or by letter within the next 1-3 weeks.  Please call us at [phone] if you have not heard about the biopsies in 3 weeks.    SIGNATURES/CONFIDENTIALITY: You and/or your care partner have signed paperwork which will be entered into your electronic medical record.  These signatures attest to the fact that that the information above on your After Visit Summary has been reviewed and is understood.  Full responsibility of the confidentiality of this discharge information lies with you and/or your care-partner.

## 2023-06-10 NOTE — Progress Notes (Signed)
Called to room to assist during endoscopic procedure.  Patient ID and intended procedure confirmed with present staff. Received instructions for my participation in the procedure from the performing physician.

## 2023-06-10 NOTE — Progress Notes (Signed)
Initial IV LFA that was started in reportedly infiltrated during procedure, was removed and a new IV was started in the R hand. I advised patient and husband to observe the IV and watch for signs and symptoms of infection. Patient and husband verbalize understanding.

## 2023-06-10 NOTE — Progress Notes (Signed)
History and Physical Interval Note:  06/10/2023 10:22 AM  Jaclyn Lopez  has presented today for endoscopic procedure(s), with the diagnosis of  Encounter Diagnoses  Name Primary?   Gastroesophageal reflux disease without esophagitis Yes   Esophageal dysphagia   .  The various methods of evaluation and treatment have been discussed with the patient and/or family. After consideration of risks, benefits and other options for treatment, the patient has consented to  the endoscopic procedure(s).   The patient's history has been reviewed, patient examined, no change in status, stable for endoscopic procedure(s).  I have reviewed the patient's chart and labs.  Questions were answered to the patient's satisfaction.     Iva Boop, MD, Clement[facility] Graham

## 2023-06-13 ENCOUNTER — Telehealth: Payer: Self-pay | Admitting: *Deleted

## 2023-06-13 NOTE — Telephone Encounter (Signed)
Left message on f/u call

## 2023-06-14 ENCOUNTER — Telehealth: Payer: Self-pay

## 2023-06-14 DIAGNOSIS — K449 Diaphragmatic hernia without obstruction or gangrene: Secondary | ICD-10-CM

## 2023-06-14 DIAGNOSIS — K219 Gastro-esophageal reflux disease without esophagitis: Secondary | ICD-10-CM

## 2023-06-14 DIAGNOSIS — R1319 Other dysphagia: Secondary | ICD-10-CM

## 2023-06-14 NOTE — Telephone Encounter (Signed)
Patient aware of date/time for her Barium Swallow with tablet test that Dr Leone Payor ordered post EGD. February 7th at 10:00am , arrive at 9:30am. No prep. To be done at [facility].

## 2023-06-24 ENCOUNTER — Ambulatory Visit (HOSPITAL_COMMUNITY)
Admission: RE | Admit: 2023-06-24 | Discharge: 2023-06-24 | Disposition: A | Discharge status: Home / Self Care | Payer: Medicare PPO | Source: Ambulatory Visit | Attending: Internal Medicine | Admitting: Internal Medicine

## 2023-06-24 DIAGNOSIS — K224 Dyskinesia of esophagus: Secondary | ICD-10-CM | POA: Diagnosis not present

## 2023-06-24 DIAGNOSIS — R1319 Other dysphagia: Secondary | ICD-10-CM | POA: Diagnosis not present

## 2023-06-24 DIAGNOSIS — K449 Diaphragmatic hernia without obstruction or gangrene: Secondary | ICD-10-CM

## 2023-06-24 DIAGNOSIS — K219 Gastro-esophageal reflux disease without esophagitis: Secondary | ICD-10-CM

## 2023-06-24 DIAGNOSIS — R1310 Dysphagia, unspecified: Secondary | ICD-10-CM | POA: Diagnosis not present

## 2023-06-27 DIAGNOSIS — Z01812 Encounter for preprocedural laboratory examination: Secondary | ICD-10-CM | POA: Diagnosis not present

## 2023-07-25 ENCOUNTER — Ambulatory Visit: Payer: Medicare PPO | Attending: General Surgery

## 2023-07-25 VITALS — Wt 151.0 lb

## 2023-07-25 DIAGNOSIS — Z483 Aftercare following surgery for neoplasm: Secondary | ICD-10-CM | POA: Insufficient documentation

## 2023-07-25 NOTE — Therapy (Signed)
 OUTPATIENT PHYSICAL THERAPY SOZO SCREENING NOTE   Patient Name: Jaclyn Lopez MRN: [id number] DOB:June 22, 1950, 73 y.o., female Today's Date: 07/25/2023  PCP: Merri Brunette, MD REFERRING PROVIDER: Emelia Loron, MD   PT End of Session - 07/25/23 1040     Visit Number 6   # unchanged due to screen only   PT Start Time 1039    PT Stop Time 1043    PT Time Calculation (min) 4 min    Activity Tolerance Patient tolerated treatment well    Behavior During Therapy [facility] for tasks assessed/performed             Past Medical History:  Diagnosis Date   Atypical glandular cells on Pap smear 2003   Cancer [facility]) 2005   Breast-Ductal CIS Right breast-Radiation - no lymph nodes removed per pt   CKD (chronic kidney disease)    CVA (cerebral vascular accident) (HCC)    Depression    Family history of breast cancer 02/12/2021   Family history of prostate cancer 02/12/2021   GERD (gastroesophageal reflux disease)    Herpes progenitalis    History of COVID-19 10/15/2020   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 Subsequent LEEP showed CIN-2 with clear margins and negative ECC   Osteopenia 11/2018   T score -1.5 FRAX 9% / 1% improved at both hips stable at spine   Personal history of breast cancer 02/12/2021   PFO (patent foramen ovale)    PONV (postoperative nausea and vomiting)    Stroke (HCC) 11/2019   Thyroid disease    Hyperthyroid   Past Surgical History:  Procedure Laterality Date   Bladder stretch and Bx  1990   BREAST LUMPECTOMY  2005   right; cancer   BREAST LUMPECTOMY  2006   left; pre cancer   BREAST SURGERY     Reduction   BUNIONECTOMY  2007, 2011   CATARACT EXTRACTION  06/17/2020   CERVICAL CONE  BIOPSY  2003   CESAREAN SECTION  1983   CHOLECYSTECTOMY N/A 06/03/2022   Procedure: LAPAROSCOPIC CHOLECYSTECTOMY;  Surgeon: Emelia Loron, MD;  Location: [facility];  Service: General;  Laterality: N/A;   DILATION AND CURETTAGE OF UTERUS  2009   ENDOMETRIAL ABLATION     Novasure   GANGLION CYST EXCISION Left 1985   HYSTEROSCOPY  2009   LEEP  05/2015   CIN-2 with clear margins   MASTECTOMY W/ SENTINEL NODE BIOPSY Right 02/26/2021   Procedure: RIGHT MASTECTOMY WITH AXILLARY SENTINEL LYMPH NODE BIOPSY;  Surgeon: Emelia Loron, MD;  Location: [facility];  Service: General;  Laterality: Right;   MASTECTOMY, PARTIAL     MOUTH SURGERY  2012   implants   TOTAL MASTECTOMY Left 02/26/2021   Procedure: LEFT TOTAL MASTECTOMY;  Surgeon: Emelia Loron, MD;  Location: [facility];  Service: General;  Laterality: Left;   TUBAL LIGATION  1984   Patient Active Problem List   Diagnosis Date Noted   S/P bilateral mastectomy 02/26/2021   Genetic testing 02/18/2021   Family history of breast cancer 02/12/2021   Personal history of breast cancer 02/12/2021   Family history of prostate cancer 02/12/2021   Malignant neoplasm of upper-outer quadrant of  right breast in female, estrogen receptor positive (HCC) 02/04/2021   CVA (cerebral vascular accident) (HCC) 11/29/2019   Depression 11/29/2019   CKD (chronic kidney disease) stage 3, GFR 30-59 ml/min (HCC) 11/29/2019   Nausea and vomiting 03/15/2019   Gastroesophageal reflux disease 03/15/2019   Loose stools 03/15/2019   Elevated alkaline phosphatase level 03/15/2019   Herpes progenitalis    Osteopenia    Hypothyroidism    IC (interstitial cystitis)    DUCTAL CARCINOMA IN SITU, RIGHT BREAST 03/12/2008   DIVERTICULOSIS OF COLON 03/12/2008    REFERRING DIAG: right breast cancer at risk for lymphedema  THERAPY DIAG: Aftercare following surgery for neoplasm  PERTINENT HISTORY: Patient was  diagnosed on 01/07/2021 with right grade II invasive ductal carcinoma breast cancer. It measures 1 cm with calcifications in the upper outer quadrant. It is ER/PR positive and HER2 negative with a Ki67 of 20%. She has a previous history of right breast DCIS with a lumpectomy and radiation in 2005. Bilateral mastectomies were performed on 02/26/2021 with 0/2 LN's on right and 0/1 LN on left.   PRECAUTIONS: right UE Lymphedema risk, None  SUBJECTIVE: Pt returns for her last first 6 month L-Dex screen.   PAIN:  Are you having pain? No  SOZO SCREENING: Patient was assessed today using the SOZO machine to determine the lymphedema index score. This was compared to her baseline score. It was determined that she is within the recommended range when compared to her baseline and no further action is needed at this time. She will continue SOZO screenings. These are done every 3 months for 2 years post operatively followed by every 6 months for 2 years, and then annually.   L-DEX FLOWSHEETS - 07/25/23 1000       L-DEX LYMPHEDEMA SCREENING   Measurement Type Unilateral    L-DEX MEASUREMENT EXTREMITY Upper Extremity    POSITION  Standing    DOMINANT SIDE Right    At Risk Side Right    BASELINE SCORE (UNILATERAL) 0.5    L-DEX SCORE (UNILATERAL) 2.6    VALUE CHANGE (UNILAT) 2.1             L-DEX FLOWSHEETS - 07/25/23 1000       L-DEX LYMPHEDEMA SCREENING   Measurement Type Unilateral    L-DEX MEASUREMENT EXTREMITY Upper Extremity    POSITION  Standing    DOMINANT SIDE Right    At Risk Side Right    BASELINE SCORE (UNILATERAL) 0.5    L-DEX SCORE (UNILATERAL) 2.6    VALUE CHANGE (UNILAT) 2.1             L-DEX FLOWSHEETS - 07/25/23 1000       L-DEX LYMPHEDEMA SCREENING   Measurement Type Unilateral    L-DEX MEASUREMENT EXTREMITY Upper Extremity    POSITION  Standing    DOMINANT SIDE Right    At Risk Side Right    BASELINE SCORE (UNILATERAL) 0.5    L-DEX SCORE (UNILATERAL) 2.6     VALUE CHANGE (UNILAT) 2.1             P: cont 6 month L-Dex screens next.    Hermenia Bers, PTA 07/25/2023, 10:44 AM

## 2023-08-10 ENCOUNTER — Encounter: Payer: Medicare PPO | Admitting: Nurse Practitioner

## 2023-09-12 ENCOUNTER — Other Ambulatory Visit (HOSPITAL_COMMUNITY)
Admission: RE | Admit: 2023-09-12 | Discharge: 2023-09-12 | Disposition: A | Discharge status: Home / Self Care | Source: Ambulatory Visit | Attending: Nurse Practitioner | Admitting: Nurse Practitioner

## 2023-09-12 ENCOUNTER — Ambulatory Visit (INDEPENDENT_AMBULATORY_CARE_PROVIDER_SITE_OTHER): Payer: Medicare PPO | Admitting: Nurse Practitioner

## 2023-09-12 ENCOUNTER — Encounter: Payer: Self-pay | Admitting: Nurse Practitioner

## 2023-09-12 VITALS — BP 112/70 | HR 80 | Ht 65.5 in | Wt 147.0 lb

## 2023-09-12 DIAGNOSIS — M8589 Other specified disorders of bone density and structure, multiple sites: Secondary | ICD-10-CM

## 2023-09-12 DIAGNOSIS — Z780 Asymptomatic menopausal state: Secondary | ICD-10-CM | POA: Diagnosis not present

## 2023-09-12 DIAGNOSIS — Z01419 Encounter for gynecological examination (general) (routine) without abnormal findings: Secondary | ICD-10-CM

## 2023-09-12 DIAGNOSIS — Z9189 Other specified personal risk factors, not elsewhere classified: Secondary | ICD-10-CM | POA: Diagnosis not present

## 2023-09-12 DIAGNOSIS — Z124 Encounter for screening for malignant neoplasm of cervix: Secondary | ICD-10-CM

## 2023-09-12 DIAGNOSIS — B009 Herpesviral infection, unspecified: Secondary | ICD-10-CM

## 2023-09-12 NOTE — Progress Notes (Addendum)
 Jaclyn Lopez 04/21/1951 [id number]   History:  73 y.o. G2 P2 presents for breast and pelvic exam without GYN complaints. Postmenopausal - no HRT, no bleeding. 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 Abnormal pap history (see below). HSV, rare outbreaks. Followed by urology for interstitial cystitis.   2014 negative cytology positive HPV 2015 negative cytology positive HPV, colposcopy normal 2016 normal cytology positive HPV negative 16/18/45, colposcopy showed LGSIL 2017 LEEP HGSIL, gladular involvement, clear margins  Gynecologic History No LMP recorded. Patient is postmenopausal.   Contraception: post menopausal status Sexually active: Yes  Health maintenance Last Pap: 11/21/2019. Results were: Normal, 3-year repeat Last mammogram: 02/04/2021. Results were: Right breast mass - malignant Last colonoscopy: 05/2019. Results were: benign polyps, 5-year recall Last Dexa: 12/16/2020 Results were: T-score -1.6, FRAX 9.8% / 1.4%  Past medical history, past surgical history, family history and social history were all reviewed and documented in the EPIC chart. Married 2 years. Retired. 2 children, oldest lives here with 2 grandchildren. Husband has 2 children - one son here who is ortho Careers adviser, other in [place].   ROS:  A ROS was performed and pertinent positives and negatives are included.  Exam:  Vitals:   09/12/23 1536  BP: 112/70  Pulse: 80  Weight: 147 lb (66.7 kg)  Height: 5' 5.5" (1.664 m)      Body mass index is 24.09 kg/m.  General appearance:  Normal Thyroid :  Symmetrical, normal in size, without palpable masses or nodularity. Respiratory  Auscultation:  Clear without wheezing or rhonchi Cardiovascular  Auscultation:  Regular rate, without rubs, murmurs or  gallops  Edema/varicosities:  Not grossly evident Abdominal  Soft,nontender, without masses, guarding or rebound.  Liver/spleen:  No organomegaly noted  Hernia:  None appreciated  Skin  Inspection:  Grossly normal   Breasts: Double mastectomy. Pelvic: External genitalia:  no lesions              Urethra:  normal appearing urethra with no masses, tenderness or lesions              Bartholins and Skenes: normal                 Vagina: normal appearing vagina with normal color and discharge, no lesions. Atrophic changes              Cervix: no lesions Bimanual Exam:  Uterus:  no masses or tenderness              Adnexa: no mass, fullness, tenderness              Rectovaginal: Deferred              Anus:  normal, no lesions  Doria Garden, NP student present as chaperone.   Assessment/Plan:  73 y.o. G2 P2 for annual exam.   Encounter for breast and pelvic examination - Education provided on SBEs, importance of preventative screenings, current guidelines, high calcium  diet, regular exercise, and multivitamin daily. Labs with PCP.   Cervical cancer screening - Plan: Cytology - PAP( [facility]). See HPI for pap history. Pap today per guidelines.   Osteopenia of multiple sites - Plan: DG Bone Density. T-score -1.6. Continue calcium  and vitamin D supplement and regular exercise.   Screening for colon cancer - 2021 colonoscopy. Will repeat at  GI's recommended interval.   Screening for breast cancer - Double mastectomy. No longer screening. Normal chest wall exam.   Return in about 2 years (around 09/11/2025) for B&P.       [facility], 4:10 PM 09/12/2023

## 2023-09-14 ENCOUNTER — Encounter: Payer: Self-pay | Admitting: Nurse Practitioner

## 2023-09-14 LAB — CYTOLOGY - PAP: Diagnosis: NEGATIVE

## 2023-10-03 ENCOUNTER — Ambulatory Visit (HOSPITAL_BASED_OUTPATIENT_CLINIC_OR_DEPARTMENT_OTHER)
Admission: RE | Admit: 2023-10-03 | Discharge: 2023-10-03 | Disposition: A | Discharge status: Home / Self Care | Source: Ambulatory Visit | Attending: Nurse Practitioner | Admitting: Nurse Practitioner

## 2023-10-03 DIAGNOSIS — Z780 Asymptomatic menopausal state: Secondary | ICD-10-CM | POA: Diagnosis not present

## 2023-10-03 DIAGNOSIS — M8589 Other specified disorders of bone density and structure, multiple sites: Secondary | ICD-10-CM | POA: Insufficient documentation

## 2023-10-06 ENCOUNTER — Ambulatory Visit: Payer: Self-pay | Admitting: Nurse Practitioner

## 2023-11-26 DIAGNOSIS — H43812 Vitreous degeneration, left eye: Secondary | ICD-10-CM | POA: Diagnosis not present

## 2023-11-30 DIAGNOSIS — H26491 Other secondary cataract, right eye: Secondary | ICD-10-CM | POA: Diagnosis not present

## 2023-11-30 DIAGNOSIS — H04123 Dry eye syndrome of bilateral lacrimal glands: Secondary | ICD-10-CM | POA: Diagnosis not present

## 2023-11-30 DIAGNOSIS — H21542 Posterior synechiae (iris), left eye: Secondary | ICD-10-CM | POA: Diagnosis not present

## 2023-11-30 DIAGNOSIS — H43813 Vitreous degeneration, bilateral: Secondary | ICD-10-CM | POA: Diagnosis not present

## 2023-11-30 DIAGNOSIS — H4302 Vitreous prolapse, left eye: Secondary | ICD-10-CM | POA: Diagnosis not present

## 2023-12-22 ENCOUNTER — Ambulatory Visit: Payer: Medicare PPO | Admitting: Hematology and Oncology

## 2023-12-27 ENCOUNTER — Inpatient Hospital Stay: Attending: Hematology and Oncology | Admitting: Hematology and Oncology

## 2023-12-27 VITALS — BP 133/65 | HR 79 | Temp 98.0°F | Resp 16 | Ht 60.0 in | Wt 152.5 lb

## 2023-12-27 DIAGNOSIS — C50411 Malignant neoplasm of upper-outer quadrant of right female breast: Secondary | ICD-10-CM | POA: Diagnosis not present

## 2023-12-27 DIAGNOSIS — Z1732 Human epidermal growth factor receptor 2 negative status: Secondary | ICD-10-CM | POA: Insufficient documentation

## 2023-12-27 DIAGNOSIS — Z79811 Long term (current) use of aromatase inhibitors: Secondary | ICD-10-CM | POA: Diagnosis not present

## 2023-12-27 DIAGNOSIS — Z79899 Other long term (current) drug therapy: Secondary | ICD-10-CM | POA: Diagnosis not present

## 2023-12-27 DIAGNOSIS — Z7982 Long term (current) use of aspirin: Secondary | ICD-10-CM | POA: Insufficient documentation

## 2023-12-27 DIAGNOSIS — M8580 Other specified disorders of bone density and structure, unspecified site: Secondary | ICD-10-CM | POA: Insufficient documentation

## 2023-12-27 DIAGNOSIS — Z1721 Progesterone receptor positive status: Secondary | ICD-10-CM | POA: Diagnosis not present

## 2023-12-27 DIAGNOSIS — Z9013 Acquired absence of bilateral breasts and nipples: Secondary | ICD-10-CM | POA: Diagnosis not present

## 2023-12-27 DIAGNOSIS — Z170 Estrogen receptor positive status [ER+]: Secondary | ICD-10-CM | POA: Diagnosis not present

## 2023-12-27 MED ORDER — LETROZOLE 2.5 MG PO TABS: 2.5000 mg | ORAL_TABLET | Freq: Every day | ORAL | 3 refills | Status: AC

## 2023-12-27 NOTE — Progress Notes (Signed)
 Patient Care Team: Clarice Nottingham, MD as PCP - General (Internal Medicine) Ebbie Cough, MD as Consulting Physician (General Surgery) Odean Potts, MD as Consulting Physician (Hematology and Oncology) Avram Lupita BRAVO, MD as Consulting Physician (Gastroenterology) Whitfield Raisin, NP as Nurse Practitioner (Neurology) Prentiss Annabella LABOR, NP as Nurse Practitioner (Gynecology)  DIAGNOSIS:  Encounter Diagnosis  Name Primary?   Malignant neoplasm of upper-outer quadrant of right breast in female, estrogen receptor positive (HCC) Yes    SUMMARY OF ONCOLOGIC HISTORY: Oncology History  Malignant neoplasm of upper-outer quadrant of right breast in female, estrogen receptor positive (HCC)  02/04/2021 Initial Diagnosis   Screening mammogram: focal asymmetry and calcifications in the right breast. Diagnostic mammogram: irregular mass. Biopsy: invasive mammary carcinoma and mammary carcinoma in situ, ER+(90%)/PR+(70%)/Her2-.  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   02/11/2021 Cancer Staging   Staging form: Breast, AJCC 8th Edition - Clinical stage from 02/11/2021: Stage IA (cT1b, cN0, cM0, G2, ER+, PR+, HER2-) - Signed by Odean Potts, MD on 02/11/2021 Stage prefix: Initial diagnosis Histologic grading system: 3 grade system Laterality: Right Staged by: Pathologist and managing physician Stage used in treatment planning: Yes National guidelines used in treatment planning: Yes Type of national guideline used in treatment planning: NCCN   02/17/2021 Genetic Testing   Negative hereditary cancer genetic testing: no pathogenic variants detected in [facility] BRCAPlus Panel or CancerNext-Expanded +RNAinsight Panel.  The report dates are February 17, 2021 and February 25, 2021, respectively.   The BRCAplus panel offered by [employer] and includes sequencing and deletion/duplication analysis for the following 8 genes: ATM, BRCA1, BRCA2, CDH1, CHEK2, PALB2, PTEN, and TP53.  The  CancerNext-Expanded gene panel offered by [facility] and includes sequencing, rearrangement, and RNA analysis for the following 77 genes: AIP, ALK, APC, ATM, AXIN2, BAP1, BARD1, BLM, BMPR1A, BRCA1, BRCA2, BRIP1, CDC73, CDH1, CDK4, CDKN1B, CDKN2A, CHEK2, CTNNA1, DICER1, FANCC, FH, FLCN, GALNT12, KIF1B, LZTR1, MAX, MEN1, MET, MLH1, MSH2, MSH3, MSH6, MUTYH, NBN, NF1, NF2, NTHL1, PALB2, PHOX2B, PMS2, POT1, PRKAR1A, PTCH1, PTEN, RAD51C, RAD51D, RB1, RECQL, RET, SDHA, SDHAF2, SDHB, SDHC, SDHD, SMAD4, SMARCA4, SMARCB1, SMARCE1, STK11, SUFU, TMEM127, TP53, TSC1, TSC2, VHL and XRCC2 (sequencing and deletion/duplication); EGFR, EGLN1, HOXB13, KIT, MITF, PDGFRA, POLD1, and POLE (sequencing only); EPCAM and GREM1 (deletion/duplication only).    02/26/2021 Surgery   Left mastectomy: ALH, 1 lymph node benign Right mastectomy: Grade 2 IDC, 2.3 cm, intermediate grade DCIS, margins negative, 0/2 lymph nodes negative ER 90%, PR 70%, HER2 negative, Ki-67 20%   03/16/2021 Oncotype testing   Oncotype DX score 19: Distant recurrence at 9 years: 6%     CHIEF COMPLIANT: Surveillance of breast cancer on letrozole .  HISTORY OF PRESENT ILLNESS:   History of Present Illness Jaclyn Lopez is a 73 year old female with breast cancer on letrozole  therapy who presents for a routine oncology follow-up.  She has been on letrozole  for three years without significant side effects. She experiences joint stiffness, particularly in her legs, which she attributes to aging. Her bone density was last assessed in May and is stable. She requires a refill for her letrozole  prescription. No hot flashes are present.     ALLERGIES:  is allergic to latex, oseltamivir, symmetrel [amantadine hcl], tamiflu [oseltamivir phosphate], and silver.  MEDICATIONS:  Current Outpatient Medications  Medication Sig Dispense Refill   aspirin  EC 81 MG EC tablet Take 1 tablet (81 mg total) by mouth daily. Swallow whole. 30 tablet 3    atorvastatin  (LIPITOR ) 40  MG tablet 1 tablet     Biotin 5 MG CAPS Take 5 mg by mouth daily.     buPROPion  (WELLBUTRIN  XL) 150 MG 24 hr tablet Take 150 mg by mouth every morning.     calcium  carbonate (TUMS - DOSED IN MG ELEMENTAL CALCIUM ) 500 MG chewable tablet Chew 1 tablet by mouth daily as needed for indigestion or heartburn.     cholecalciferol (VITAMIN D3) 25 MCG (1000 UNIT) tablet Take 1,000 Units by mouth daily.     FLUoxetine  (PROZAC ) 20 MG capsule Take 20 mg by mouth daily.     ibuprofen (ADVIL) 200 MG tablet Take 200 mg by mouth every 6 (six) hours as needed for headache or moderate pain.     letrozole  (FEMARA ) 2.5 MG tablet Take 1 tablet (2.5 mg total) by mouth daily. 90 tablet 3   levothyroxine  (SYNTHROID , LEVOTHROID) 50 MCG tablet Take 50 mcg by mouth daily.     mirtazapine  (REMERON ) 15 MG tablet Take 3.75 mg by mouth at bedtime.      omeprazole  (PRILOSEC ) 20 MG capsule TAKE 1 CAPSULE BY MOUTH EVERY DAY 90 capsule 3   ondansetron  (ZOFRAN ) 4 MG tablet Take 4 mg by mouth every 6 (six) hours as needed.     zaleplon (SONATA) 10 MG capsule Take 10 mg by mouth at bedtime as needed for sleep.     Current Facility-Administered Medications  Medication Dose Route Frequency Provider Last Rate Last Admin   0.9 %  sodium chloride  infusion  500 mL Intravenous Once Avram Lupita BRAVO, MD       Facility-Administered Medications Ordered in Other Visits  Medication Dose Route Frequency Provider Last Rate Last Admin   Spy Agent Green / Firefly Optime  1.25 mg Intravenous Once Ebbie Cough, MD        PHYSICAL EXAMINATION: ECOG PERFORMANCE STATUS: 1 - Symptomatic but completely ambulatory  Vitals:   12/27/23 1507  BP: 133/65  Pulse: 79  Resp: 16  Temp: 98 F (36.7 C)  SpO2: 98%   Filed Weights   12/27/23 1507  Weight: 152 lb 8 oz (69.2 kg)    Physical Exam No palpable lumps or nodules in bilateral breasts or axilla  (exam performed in the presence of a chaperone)  LABORATORY  DATA:  I have reviewed the data as listed    Latest Ref Rng & Units 05/25/2023   10:09 AM 04/29/2022   11:07 AM 02/11/2021   12:25 PM  CMP  Glucose 70 - 99 mg/dL 85  87  896   BUN 6 - 23 mg/dL 16  15  19    Creatinine 0.40 - 1.20 mg/dL 8.86  8.78  8.85   Sodium 135 - 145 mEq/L 140  141  138   Potassium 3.5 - 5.1 mEq/L 4.6  4.7  4.5   Chloride 96 - 112 mEq/L 103  105  104   CO2 19 - 32 mEq/L 27  30  24    Calcium  8.4 - 10.5 mg/dL 9.6  9.5  9.5   Total Protein 6.0 - 8.3 g/dL 7.2  6.9  7.0   Total Bilirubin 0.2 - 1.2 mg/dL 0.5  0.7  0.7   Alkaline Phos 39 - 117 U/L 120  129  116   AST 0 - 37 U/L 20  20  22    ALT 0 - 35 U/L 16  20  25      Lab Results  Component Value Date   WBC 6.6 05/25/2023   HGB  14.0 05/25/2023   HCT 42.2 05/25/2023   MCV 86.4 05/25/2023   PLT 199.0 05/25/2023   NEUTROABS 3.9 05/25/2023    ASSESSMENT & PLAN:  Malignant neoplasm of upper-outer quadrant of right breast in female, estrogen receptor positive (HCC) Bilateral mastectomies 02/26/2021 Left mastectomy: ALH, 1 lymph node benign Right mastectomy: Grade 2 IDC, 2.3 cm, intermediate grade DCIS, margins negative, 0/2 lymph nodes negative ER 90%, PR 70%, HER2 negative, Ki-67 20% Oncotype DX score 19: Distant recurrence at 9 years: 6%   Current treatment: Letrozole  started November 2022 Letrozole  toxicities:  Weight gain: Stable   Breast cancer surveillance: 1.  Chest exam 12/27/2023 benign 2. no role of imaging study since she had bilateral mastectomies She got married in May 2023   Return to clinic in 1 year for follow-up   Assessment & Plan Estrogen receptor positive malignant neoplasm of upper-outer quadrant of right breast, status post treatment, on adjuvant hormone therapy Three years post-treatment, on letrozole  with no significant side effects. Mammograms not needed. - Continue letrozole  2.5 mg oral daily. - Refill letrozole  prescription at [facility], [address], [address]. - Continue  letrozole  for a total of five years. - Transition follow-up to primary care physician after completion of hormone therapy.  Osteopenia Mild osteopenia with well-managed bone density. - Monitor bone density every other year.      No orders of the defined types were placed in this encounter.  The patient has a good understanding of the overall plan. she agrees with it. she will call with any problems that may develop before the next visit here. Total time spent: 30 mins including face to face time and time spent for planning, charting and co-ordination of care   Viinay K Carlean Crowl, MD 12/27/23

## 2023-12-27 NOTE — Assessment & Plan Note (Signed)
 Bilateral mastectomies 02/26/2021 Left mastectomy: ALH, 1 lymph node benign Right mastectomy: Grade 2 IDC, 2.3 cm, intermediate grade DCIS, margins negative, 0/2 lymph nodes negative ER 90%, PR 70%, HER2 negative, Ki-67 20% Oncotype DX score 19: Distant recurrence at 9 years: 6%   Current treatment: Letrozole  started November 2022 Letrozole  toxicities:  Weight gain: Stable   Breast cancer surveillance: 1.  Chest exam 12/27/2023 benign 2. no role of imaging study since she had bilateral mastectomies She got married in May 2023   Return to clinic in 1 year for follow-up

## 2023-12-28 DIAGNOSIS — H26491 Other secondary cataract, right eye: Secondary | ICD-10-CM | POA: Diagnosis not present

## 2024-01-18 DIAGNOSIS — H26491 Other secondary cataract, right eye: Secondary | ICD-10-CM | POA: Diagnosis not present

## 2024-01-23 ENCOUNTER — Ambulatory Visit

## 2024-02-02 ENCOUNTER — Ambulatory Visit: Attending: General Surgery | Admitting: Physical Therapy

## 2024-02-02 DIAGNOSIS — Z483 Aftercare following surgery for neoplasm: Secondary | ICD-10-CM | POA: Insufficient documentation

## 2024-02-02 NOTE — Therapy (Signed)
 OUTPATIENT PHYSICAL THERAPY SOZO SCREENING NOTE   Patient Name: Jaclyn Lopez MRN: [id number] DOB:12-Aug-1950, 73 y.o., female Today's Date: 02/02/2024  PCP: Clarice Nottingham, MD REFERRING PROVIDER: Ebbie Cough, MD   PT End of Session - 02/02/24 1357     Visit Number 1    Number of Visits 1    PT Start Time 1055    PT Stop Time 1105    PT Time Calculation (min) 10 min    Activity Tolerance Patient tolerated treatment well    Behavior During Therapy [facility] for tasks assessed/performed          Past Medical History:  Diagnosis Date   Atypical glandular cells on Pap smear 2003   Cancer [facility]) 2005   Breast-Ductal CIS Right breast-Radiation - no lymph nodes removed per pt   CKD (chronic kidney disease)    CVA (cerebral vascular accident) (HCC)    Depression    Family history of breast cancer 02/12/2021   Family history of prostate cancer 02/12/2021   GERD (gastroesophageal reflux disease)    Herpes progenitalis    History of COVID-19 10/15/2020   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 Subsequent LEEP showed CIN-2 with clear margins and negative ECC   Osteopenia 11/2018   T score -1.5 FRAX 9% / 1% improved at both hips stable at spine   Personal history of breast cancer 02/12/2021   PFO (patent foramen ovale)    PONV (postoperative nausea and vomiting)    Stroke (HCC) 11/2019   Thyroid  disease    Hyperthyroid   Past Surgical History:  Procedure Laterality Date   Bladder stretch and Bx  1990   BREAST LUMPECTOMY  2005   right; cancer   BREAST LUMPECTOMY  2006   left; pre cancer   BREAST SURGERY     Reduction   BUNIONECTOMY  2007, 2011   CATARACT EXTRACTION  06/17/2020   CERVICAL CONE BIOPSY  2003    CESAREAN SECTION  1983   CHOLECYSTECTOMY N/A 06/03/2022   Procedure: LAPAROSCOPIC CHOLECYSTECTOMY;  Surgeon: Ebbie Cough, MD;  Location: [facility];  Service: General;  Laterality: N/A;   DILATION AND CURETTAGE OF UTERUS  2009   ENDOMETRIAL ABLATION     Novasure   GANGLION CYST EXCISION Left 1985   HYSTEROSCOPY  2009   LEEP  05/2015   CIN-2 with clear margins   MASTECTOMY W/ SENTINEL NODE BIOPSY Right 02/26/2021   Procedure: RIGHT MASTECTOMY WITH AXILLARY SENTINEL LYMPH NODE BIOPSY;  Surgeon: Ebbie Cough, MD;  Location: [facility];  Service: General;  Laterality: Right;   MASTECTOMY, PARTIAL     MOUTH SURGERY  2012   implants   TOTAL MASTECTOMY Left 02/26/2021   Procedure: LEFT TOTAL MASTECTOMY;  Surgeon: Ebbie Cough, MD;  Location: [facility];  Service: General;  Laterality: Left;   TUBAL LIGATION  1984   Patient Active Problem List   Diagnosis Date Noted   S/P bilateral mastectomy 02/26/2021   Genetic testing 02/18/2021   Family history of breast cancer 02/12/2021   Personal history of breast cancer 02/12/2021   Family history of prostate cancer 02/12/2021   Malignant neoplasm of upper-outer quadrant of right breast in  female, estrogen receptor positive (HCC) 02/04/2021   CVA (cerebral vascular accident) (HCC) 11/29/2019   Depression 11/29/2019   CKD (chronic kidney disease) stage 3, GFR 30-59 ml/min (HCC) 11/29/2019   Nausea and vomiting 03/15/2019   Gastroesophageal reflux disease 03/15/2019   Loose stools 03/15/2019   Elevated alkaline phosphatase level 03/15/2019   Herpes progenitalis    Osteopenia    Hypothyroidism    IC (interstitial cystitis)    DUCTAL CARCINOMA IN SITU, RIGHT BREAST 03/12/2008   DIVERTICULOSIS OF COLON 03/12/2008    REFERRING DIAG: right breast cancer at risk for lymphedema  THERAPY DIAG:  Aftercare following surgery for neoplasm  PERTINENT HISTORY: Patient was diagnosed on  01/07/2021 with right grade II invasive ductal carcinoma breast cancer. It measures 1 cm with calcifications in the upper outer quadrant. It is ER/PR positive and HER2 negative with a Ki67 of 20%. She has a previous history of right breast DCIS with a lumpectomy and radiation in 2005. Bilateral mastectomies were performed on 02/26/2021 with 0/2 LN's on right and 0/1 LN on left.   PRECAUTIONS: right UE Lymphedema risk  SUBJECTIVE: Here for SOZO screen  PAIN:  Are you having pain? No  SOZO SCREENING: Patient was assessed today using the SOZO machine to determine the lymphedema index score. This was compared to her baseline score. It was determined that she is within the recommended range when compared to her baseline and no further action is needed at this time. She will continue SOZO screenings. These are done every 3 months for 2 years post operatively followed by every 6 months for 2 years, and then annually.   L-DEX FLOWSHEETS - 02/02/24 1300       L-DEX LYMPHEDEMA SCREENING   Measurement Type Unilateral    L-DEX MEASUREMENT EXTREMITY Upper Extremity    POSITION  Standing    DOMINANT SIDE Right    At Risk Side Right    BASELINE SCORE (UNILATERAL) 0.5    L-DEX SCORE (UNILATERAL) -1.1    VALUE CHANGE (UNILAT) -1.6         Eward Wonda Sharps, [place] 02/02/24 1:59 PM

## 2024-03-09 DIAGNOSIS — E039 Hypothyroidism, unspecified: Secondary | ICD-10-CM | POA: Diagnosis not present

## 2024-03-09 DIAGNOSIS — E7800 Pure hypercholesterolemia, unspecified: Secondary | ICD-10-CM | POA: Diagnosis not present

## 2024-03-15 DIAGNOSIS — N1832 Chronic kidney disease, stage 3b: Secondary | ICD-10-CM | POA: Diagnosis not present

## 2024-03-15 DIAGNOSIS — E039 Hypothyroidism, unspecified: Secondary | ICD-10-CM | POA: Diagnosis not present

## 2024-03-15 DIAGNOSIS — N3010 Interstitial cystitis (chronic) without hematuria: Secondary | ICD-10-CM | POA: Diagnosis not present

## 2024-03-15 DIAGNOSIS — Z0000 Encounter for general adult medical examination without abnormal findings: Secondary | ICD-10-CM | POA: Diagnosis not present

## 2024-03-15 DIAGNOSIS — Z23 Encounter for immunization: Secondary | ICD-10-CM | POA: Diagnosis not present

## 2024-03-15 DIAGNOSIS — R748 Abnormal levels of other serum enzymes: Secondary | ICD-10-CM | POA: Diagnosis not present

## 2024-03-15 DIAGNOSIS — C50411 Malignant neoplasm of upper-outer quadrant of right female breast: Secondary | ICD-10-CM | POA: Diagnosis not present

## 2024-03-15 DIAGNOSIS — G4700 Insomnia, unspecified: Secondary | ICD-10-CM | POA: Diagnosis not present

## 2024-03-15 DIAGNOSIS — F3340 Major depressive disorder, recurrent, in remission, unspecified: Secondary | ICD-10-CM | POA: Diagnosis not present

## 2024-05-30 ENCOUNTER — Other Ambulatory Visit: Payer: Self-pay | Admitting: Physician Assistant

## 2024-05-30 DIAGNOSIS — R140 Abdominal distension (gaseous): Secondary | ICD-10-CM

## 2024-05-30 DIAGNOSIS — R748 Abnormal levels of other serum enzymes: Secondary | ICD-10-CM

## 2024-06-21 ENCOUNTER — Encounter: Payer: Self-pay | Admitting: Internal Medicine

## 2024-08-16 ENCOUNTER — Ambulatory Visit: Payer: Self-pay | Admitting: Physical Therapy

## 2024-12-27 ENCOUNTER — Ambulatory Visit: Admitting: Hematology and Oncology
